# Patient Record
Sex: Female | Born: 1960 | Race: White | Hispanic: No | Marital: Married
Health system: Midwestern US, Community
[De-identification: ages and names within clinical notes are randomized; demographics above are authoritative.]

## PROBLEM LIST (undated history)

## (undated) DIAGNOSIS — IMO0002 Reserved for concepts with insufficient information to code with codable children: Secondary | ICD-10-CM

## (undated) DIAGNOSIS — M79641 Pain in right hand: Secondary | ICD-10-CM

## (undated) DIAGNOSIS — R059 Cough, unspecified: Secondary | ICD-10-CM

## (undated) DIAGNOSIS — R920 Mammographic microcalcification found on diagnostic imaging of breast: Secondary | ICD-10-CM

## (undated) DIAGNOSIS — Z79899 Other long term (current) drug therapy: Secondary | ICD-10-CM

## (undated) DIAGNOSIS — J029 Acute pharyngitis, unspecified: Secondary | ICD-10-CM

## (undated) DIAGNOSIS — Z9889 Other specified postprocedural states: Secondary | ICD-10-CM

## (undated) DIAGNOSIS — R109 Unspecified abdominal pain: Secondary | ICD-10-CM

## (undated) DIAGNOSIS — M25551 Pain in right hip: Secondary | ICD-10-CM

## (undated) DIAGNOSIS — R079 Chest pain, unspecified: Secondary | ICD-10-CM

## (undated) DIAGNOSIS — R002 Palpitations: Secondary | ICD-10-CM

## (undated) DIAGNOSIS — M25819 Other specified joint disorders, unspecified shoulder: Secondary | ICD-10-CM

## (undated) DIAGNOSIS — D329 Benign neoplasm of meninges, unspecified: Secondary | ICD-10-CM

## (undated) DIAGNOSIS — M25552 Pain in left hip: Secondary | ICD-10-CM

## (undated) DIAGNOSIS — K909 Intestinal malabsorption, unspecified: Secondary | ICD-10-CM

## (undated) DIAGNOSIS — R922 Inconclusive mammogram: Secondary | ICD-10-CM

## (undated) DIAGNOSIS — N63 Unspecified lump in unspecified breast: Secondary | ICD-10-CM

## (undated) DIAGNOSIS — M1811 Unilateral primary osteoarthritis of first carpometacarpal joint, right hand: Secondary | ICD-10-CM

## (undated) DIAGNOSIS — M25559 Pain in unspecified hip: Secondary | ICD-10-CM

## (undated) DIAGNOSIS — N816 Rectocele: Secondary | ICD-10-CM

## (undated) DIAGNOSIS — M25561 Pain in right knee: Secondary | ICD-10-CM

## (undated) DIAGNOSIS — G4700 Insomnia, unspecified: Secondary | ICD-10-CM

## (undated) DIAGNOSIS — K859 Acute pancreatitis without necrosis or infection, unspecified: Secondary | ICD-10-CM

## (undated) DIAGNOSIS — R0602 Shortness of breath: Secondary | ICD-10-CM

## (undated) DIAGNOSIS — E7220 Disorder of urea cycle metabolism, unspecified: Secondary | ICD-10-CM

## (undated) MED ORDER — ZOLPIDEM SR 12.5 MG MULTIPHASE TAB
12.5 mg | ORAL_TABLET | ORAL | Status: DC
Start: ? — End: 2012-05-14

## (undated) MED ORDER — BENZONATATE 100 MG CAP
100 mg | ORAL_CAPSULE | Freq: Three times a day (TID) | ORAL | Status: AC | PRN
Start: ? — End: 2012-05-06

## (undated) MED ORDER — ALBUTEROL SULFATE HFA 90 MCG/ACTUATION AEROSOL INHALER
90 mcg/actuation | Freq: Four times a day (QID) | RESPIRATORY_TRACT | Status: DC | PRN
Start: ? — End: 2013-11-03

## (undated) MED ORDER — HYDROCODONE 10 MG-CHLORPHENIRAMINE 8 MG/5 ML ORAL SUSP EXTEND.REL 12HR
10-8 mg/5 mL | Freq: Two times a day (BID) | ORAL | Status: DC | PRN
Start: ? — End: 2012-05-14

## (undated) MED ORDER — HYDROCODONE 10 MG-CHLORPHENIRAMINE 8 MG/5 ML ORAL SUSP EXTEND.REL 12HR
10-8 mg/5 mL | Freq: Two times a day (BID) | ORAL | Status: DC | PRN
Start: ? — End: 2012-05-07

## (undated) MED ORDER — LEVOFLOXACIN 500 MG TAB
500 mg | ORAL_TABLET | Freq: Every day | ORAL | Status: DC
Start: ? — End: 2012-05-14

---

## 2008-08-16 LAB — CBC WITH AUTOMATED DIFF
ABS. EOSINOPHILS: 0 10*3/uL (ref 0.0–0.4)
ABS. LYMPHOCYTES: 1.1 10*3/uL (ref 0.8–3.5)
ABS. MONOCYTES: 0.4 10*3/uL (ref 0–1.0)
ABS. NEUTROPHILS: 4.3 10*3/uL (ref 1.8–8.0)
BASOPHILS: 0 % (ref 0–3)
EOSINOPHILS: 1 % (ref 0–5)
HCT: 41.9 % (ref 36.0–46.0)
HGB: 14.6 g/dL (ref 12.0–16.0)
LYMPHOCYTES: 19 % — ABNORMAL LOW (ref 20–51)
MCH: 31.5 PG (ref 25.0–35.0)
MCHC: 34.7 g/dL (ref 31.0–37.0)
MCV: 90.9 FL (ref 78.0–102.0)
MONOCYTES: 6 % (ref 2–9)
MPV: 9 FL (ref 7.4–10.4)
NEUTROPHILS: 74 % (ref 42–75)
PLATELET: 211 10*3/uL (ref 130–400)
RBC: 4.61 M/uL (ref 4.10–5.10)
RDW: 14.7 % — ABNORMAL HIGH (ref 11.5–14.5)
WBC: 5.8 10*3/uL (ref 4.5–13.0)

## 2008-08-16 LAB — TSH 3RD GENERATION: TSH: 0.79 u[IU]/mL (ref 0.51–6.27)

## 2008-08-16 LAB — LIPID PANEL
CHOL/HDL Ratio: 2.4 (ref 0–5.0)
Cholesterol, total: 201 MG/DL — ABNORMAL HIGH (ref 0–200)
HDL Cholesterol: 84 MG/DL — ABNORMAL HIGH (ref 40–60)
LDL, calculated: 94.4 MG/DL (ref 0–100)
Triglyceride: 113 MG/DL (ref 0–150)
VLDL, calculated: 22.6 MG/DL

## 2008-08-16 LAB — SED RATE (ESR): Sed rate (ESR): 2 MM/HR (ref 0–20)

## 2008-08-16 LAB — VITAMIN B12: Vitamin B12: 741 pg/mL (ref 211–911)

## 2008-08-16 LAB — MONONUCLEOSIS SCREEN: Mononucleosis screen: NEGATIVE

## 2008-08-16 LAB — AMYLASE: Amylase: 43 U/L (ref 25–115)

## 2008-08-16 LAB — T4, FREE: T4, Free: 1 NG/DL (ref 0.89–1.76)

## 2008-08-16 LAB — LIPASE: Lipase: 211 U/L (ref 114–286)

## 2008-08-16 LAB — T3, FREE: Triiodothyronine (T3), free: 3.1 PG/ML (ref 2.3–4.2)

## 2008-08-17 LAB — METABOLIC PANEL, BASIC
Anion gap: 8 mmol/L (ref 5–15)
BUN/Creatinine ratio: 16 (ref 12–20)
BUN: 13 MG/DL (ref 7–18)
CO2: 29 MMOL/L (ref 21–32)
Calcium: 9.2 MG/DL (ref 8.4–10.4)
Chloride: 100 MMOL/L (ref 100–108)
Creatinine: 0.8 MG/DL (ref 0.6–1.3)
GFR est AA: >60 mL/min/{1.73_m2} (ref >60)
GFR est non-AA: >60 mL/min/{1.73_m2} (ref >60)
Glucose: 77 MG/DL (ref 74–99)
Potassium: 4.8 MMOL/L (ref 3.5–5.5)
Sodium: 137 MMOL/L (ref 136–145)

## 2008-08-18 LAB — EPSTEIN BARR VIRUS AB PANEL
EBV Ab Early Ag,IgG: 0.13 IV (ref 0.00–0.99)
EBV Ab NucAg,IgG: 7.66 IV
EBV Ab VCA,IgG: 8.76 IV
EBV Ab VCA,IgM: 0.2 IV (ref 0.00–0.99)

## 2008-08-18 LAB — VITAMIN D, 25 HYDROXY: Vitamin D 25-Hydroxy: 36 ng/mL (ref 30–80)

## 2008-08-18 LAB — IMMUNOGLOBULIN G, QT: IMMUNOGLOBULIN G: 884 mg/dL (ref 768–1632)

## 2008-08-19 LAB — VITAMIN B6: VITAMIN B6: 9 ng/mL (ref 5.0–30.0)

## 2008-08-20 LAB — VITAMIN B1, WHOLE BLOOD: VITAMIN B1: 127 nmol/L (ref 70–180)

## 2008-12-06 LAB — METABOLIC PANEL, BASIC
Anion gap: 4 mmol/L — ABNORMAL LOW (ref 5–15)
BUN/Creatinine ratio: 16 (ref 12–20)
BUN: 14 MG/DL (ref 7–18)
CO2: 31 MMOL/L (ref 21–32)
Calcium: 8.9 MG/DL (ref 8.4–10.4)
Chloride: 106 MMOL/L (ref 100–108)
Creatinine: 0.9 MG/DL (ref 0.6–1.3)
GFR est AA: >60 mL/min/{1.73_m2} (ref >60)
GFR est non-AA: >60 mL/min/{1.73_m2} (ref >60)
Glucose: 83 MG/DL (ref 74–99)
Potassium: 5.3 MMOL/L (ref 3.5–5.5)
Sodium: 141 MMOL/L (ref 136–145)

## 2008-12-06 LAB — HEPATIC FUNCTION PANEL
A-G Ratio: 1.5 (ref 0.8–1.7)
ALT (SGPT): 43 U/L (ref 30–65)
AST (SGOT): 22 U/L (ref 15–37)
Albumin: 3.9 g/dL (ref 3.4–5.0)
Alk. phosphatase: 104 U/L (ref 50–136)
Bilirubin, direct: 0.1 MG/DL (ref 0.0–0.3)
Bilirubin, total: 0.4 MG/DL (ref 0.1–0.9)
Globulin: 2.6 g/dL (ref 2.0–4.0)
Protein, total: 6.5 g/dL (ref 6.4–8.2)

## 2008-12-06 LAB — LIPASE: Lipase: 102 U/L (ref 73–393)

## 2008-12-06 LAB — CBC WITH AUTOMATED DIFF
ABS. EOSINOPHILS: 0.1 10*3/uL (ref 0.0–0.4)
ABS. LYMPHOCYTES: 1.3 10*3/uL (ref 0.8–3.5)
ABS. MONOCYTES: 0.4 10*3/uL (ref 0–1.0)
ABS. NEUTROPHILS: 2.3 10*3/uL (ref 1.8–8.0)
BASOPHILS: 1 % (ref 0–3)
EOSINOPHILS: 2 % (ref 0–5)
HCT: 42.5 % (ref 36.0–46.0)
HGB: 14.5 g/dL (ref 12.0–16.0)
LYMPHOCYTES: 31 % (ref 20–51)
MCH: 30.7 PG (ref 25.0–35.0)
MCHC: 34 g/dL (ref 31.0–37.0)
MCV: 90.2 FL (ref 78.0–102.0)
MONOCYTES: 9 % (ref 2–9)
MPV: 9 FL (ref 7.4–10.4)
NEUTROPHILS: 57 % (ref 42–75)
PLATELET: 186 10*3/uL (ref 130–400)
RBC: 4.71 M/uL (ref 4.10–5.10)
RDW: 13.9 % (ref 11.5–14.5)
WBC: 4.1 10*3/uL — ABNORMAL LOW (ref 4.5–13.0)

## 2008-12-06 LAB — LIPID PANEL
CHOL/HDL Ratio: 2.7 (ref 0–5.0)
Cholesterol, total: 194 MG/DL (ref 0–200)
HDL Cholesterol: 73 MG/DL — ABNORMAL HIGH (ref 40–60)
LDL, calculated: 99.8 MG/DL (ref 0–100)
Triglyceride: 106 MG/DL (ref 0–150)
VLDL, calculated: 21.2 MG/DL

## 2008-12-06 LAB — SED RATE (ESR): Sed rate (ESR): 2 MM/HR (ref 0–20)

## 2008-12-06 LAB — T4, FREE: T4, Free: 0.9 NG/DL (ref 0.89–1.76)

## 2008-12-06 LAB — AMYLASE: Amylase: 39 U/L (ref 25–115)

## 2008-12-06 LAB — TSH 3RD GENERATION: TSH: 1.17 u[IU]/mL (ref 0.51–6.27)

## 2008-12-06 LAB — VITAMIN B12: Vitamin B12: 529 pg/mL (ref 211–911)

## 2008-12-07 LAB — HEMOGLOBIN A1C WITH EAG: Hemoglobin A1c: 5.1 % (ref 4.8–6.0)

## 2008-12-08 LAB — ZINC: Zinc,serum: 70 ug/dL (ref 60–120)

## 2008-12-08 LAB — VITAMIN D, 25 HYDROXY: Vitamin D 25-Hydroxy: 36 ng/mL (ref 30–80)

## 2008-12-09 LAB — VITAMIN B6: VITAMIN B6: 12.5 ng/mL (ref 5.0–30.0)

## 2008-12-10 LAB — VITAMIN B1, WHOLE BLOOD: VITAMIN B1: 112 nmol/L (ref 70–180)

## 2008-12-14 NOTE — Telephone Encounter (Signed)
Message copied by Antonieta Pert on Thu Dec 14, 2008  2:48 PM  ------       Message from: Bonney Leitz ANN       Created: Wed Dec 13, 2008  3:24 PM       Regarding: Cornell Barman: [phone]         Patient requests lab results done at [facility] last week.              mh

## 2008-12-18 NOTE — Telephone Encounter (Signed)
Dr Betha Loa talked with pt

## 2008-12-19 LAB — AMB POC RAPID STREP A: Group A Strep Ag: NEGATIVE

## 2008-12-19 MED ORDER — LEVOFLOXACIN 500 MG TAB
500 mg | ORAL_TABLET | Freq: Every day | ORAL | Status: AC
Start: 2008-12-19 — End: 2008-12-24

## 2008-12-19 MED ORDER — BENZOCAINE 10 MG LOZENGES
10 mg | ORAL_TABLET | Freq: Four times a day (QID) | Status: DC
Start: 2008-12-19 — End: 2009-06-06

## 2008-12-19 NOTE — Progress Notes (Signed)
CHIEF COMPLAINT:  Chief Complaint   Patient presents with   ??? Sore Throat     uncontrolled     UTI   HISTORY OF THE PRESENT ILLNESS:  Rosette Bellavance is a 48 y.o. female    Going on sore throat since yesterday face hurts on left side ? toooth causing pain even ear was hurting . No numbness or tingling     Burning sensation after cystoscope called dr Jacquenette Shone and he started ciproflo\\xacin   Helping a little but it has been 5 days so getting better     PAST MEDICAL HISTORY:    FAMILY HISTORY:  Family History   Problem Relation   ??? Asthma Mother   ??? Breast Cancer Mother   ??? Other Father     cabg       MEDICATIONS:  Current outpatient prescriptions:venlafaxine-SR (EFFEXOR XR) 75 mg capsule, Take 75 mg by mouth daily., Disp: , Rfl: ;  ERGOCALCIFEROL (VITAMIN D PO), Take 1,000 Units by mouth two (2) times a day., Disp: , Rfl: ;  hyoscyamine (LEVSIN) 0.125 mg tablet, Take 125 mcg by mouth every four (4) hours as needed., Disp: , Rfl: ;  primidone (MYSOLINE) 50 mg tablet, Take 50 mg by mouth three (3) times daily. One in pm and 1/2 in am, Disp: , Rfl:   TRAMADOL HCL (TRAMADOL PO), Take 50 mg by mouth every evening. One to two tabs prn, Disp: , Rfl: ;  amitriptyline (ELAVIL) 75 mg tablet, Take 25 mg by mouth nightly., Disp: , Rfl: ;  SOLIFENACIN SUCCINATE (VESICARE PO), Take  by mouth., Disp: , Rfl: ;  ciprofloxacin 100 mg tablet, Take 100 mg by mouth two (2) times a day., Disp: , Rfl:   There are no discontinued medications.  ALLERGY:  Allergies   Allergen Reactions   ??? Tetracycline Unknown (comments)       REVIEW OF SYSTEMS:  CVS:    No Chest pain or Palpitations  Respiratory:  No Shortness of breath or cough  PHYSICAL EXAMINATION:  GEN: BP 119/83   Pulse 81   Temp(Src) 98 ??F (36.7 ??C) (Oral)   Ht 5' 2.5" (1.588 m)   Wt 161 lb 4 oz (73.143 kg)   Appearance       Normal   EYE: Conjunctiva/Lids(congestion or icterus)   Normal    Pupils(ERRLA EOM Nystagmus)    Normal   ENT: Mouth Inspection(Lesions Dentician)   Normal     Oropharynx (Tonsil Eryth Exud)   Normal   Neck:  Exam (supple ROM LAN Bruit)   Normal    Thyroid(Goiter Nodularity)    Normal   Lung: Effort(flaring retractions abd breathing)  Normal    Auscultation(coarse wheezing or crackles)  Normal   CVS: Auscultation(murmur click rub gallop)  Normal   Extremities(cyanosis clubbing edema)  Normal  GI: Mass Tenderness(distended)    Normal   Liver Spleen(hepatosplenomegaly ascites other) Normal  LABORATORY AND IMAGING DATA:  Strept throat -   ASSESSMENT:  Chief Complaint   Patient presents with   ??? Sore Throat     uncontrolled     UTI  Plan:  Stop Cipro  Start Levaquin to rx both sinuses and uti  Benzocaine to numb pain   ER if any changes or increasing pain or numbness

## 2008-12-19 NOTE — Progress Notes (Signed)
Sore throat since yesterday

## 2008-12-19 NOTE — Patient Instructions (Signed)
Plan:  Stop Cipro  Start Levaquin to rx both sinuses and uti  Benzocaine to numb pain   ER if any changes or increasing pain or numbness

## 2009-01-03 NOTE — Telephone Encounter (Signed)
Offered ENT pt not interested  No colored mucous hold off on abx  Immediate appt if needed will try zyrtec and call if still having problems

## 2009-03-09 LAB — VITAMIN B12: Vitamin B12: 626 pg/mL (ref 211–911)

## 2009-03-09 LAB — FOLLICLE STIMULATING HORMONE: FSH: 7.6 m[IU]/mL

## 2009-03-10 LAB — VITAMIN D, 25 HYDROXY: Vitamin D 25-Hydroxy: 38 ng/mL (ref 30–80)

## 2009-06-06 LAB — AMB POC RAPID STREP A: Group A Strep Ag: NEGATIVE

## 2009-06-06 MED ORDER — CEPHALEXIN 500 MG CAP
500 mg | ORAL_CAPSULE | Freq: Three times a day (TID) | ORAL | Status: AC
Start: 2009-06-06 — End: 2009-06-20

## 2009-06-06 MED ORDER — HYDROCODONE 10 MG-CHLORPHENIRAMINE 8 MG/5 ML ORAL SUSP EXTEND.REL 12HR
10-8 mg/5 mL | Freq: Two times a day (BID) | ORAL | Status: DC | PRN
Start: 2009-06-06 — End: 2009-07-17

## 2009-06-06 NOTE — Progress Notes (Signed)
Chief Complaint   Patient presents with   ??? Sore Throat   ??? Fever   ??? Diarrhea   ??? Cough

## 2009-06-06 NOTE — Progress Notes (Signed)
Chief Complaint   Patient presents with   ??? Sore Throat   ??? Fever   ??? Diarrhea   ??? Cough       HISTORY OF THE PRESENT ILLNESS:  Cassandra Daniels is a 48 y.o. female    Sore throat  Fever  Cough last night   I am trying to wheeza a little  A little of diarrhea   Fatigue     Past Medical History   Diagnosis Date   ??? Arthritis    ??? Fibrocystic breast    ??? Mitral (valve) prolapse    ??? Chronic fatigue    ??? Sleep apnea    ??? Vitamin B 12 deficiency    ??? Paroxysmal atrial tachycardia    ??? HTN        Family History   Problem Relation Age of Onset   ??? Asthma Mother    ??? Breast Cancer Mother    ??? Other Father      cabg       History   Social History   ??? Marital Status: Married     Spouse Name: N/A     Number of Children: N/A   ??? Years of Education: N/A   Occupational History   ??? Not on file.   Social History Main Topics   ??? Tobacco Use: Never   ??? Alcohol Use: Yes      rare   ??? Drug Use: No   ??? Sexually Active:    Other Topics Concern   ??? Not on file   Social History Narrative   ??? No narrative on file       MEDICATIONS:  Current outpatient prescriptions   Medication Sig   ??? amitriptyline (ELAVIL) 25 mg tablet Take  by mouth nightly.   ??? venlafaxine-SR (EFFEXOR XR) 150 mg capsule Take  by mouth daily.   ??? ERGOCALCIFEROL (VITAMIN D PO) Take 1,000 Units by mouth two (2) times a day.   ??? hyoscyamine (LEVSIN) 0.125 mg tablet Take 125 mcg by mouth every four (4) hours as needed.   ??? primidone (MYSOLINE) 50 mg tablet Take 50 mg by mouth three (3) times daily. One in pm and 1/2 in am   ??? TRAMADOL HCL (TRAMADOL PO) Take 50 mg by mouth. Take 150mg  BID prn   ??? SOLIFENACIN SUCCINATE (VESICARE PO) Take  by mouth.   ??? benzocaine 10 mg Lozg 10 mg by Mucous Membrane route four (4) times daily.       Medications Discontinued During This Encounter   Medication Reason   ??? amitriptyline (ELAVIL) 75 mg tablet    ??? venlafaxine-SR (EFFEXOR XR) 75 mg capsule        Allergies   Allergen Reactions   ??? Tetracycline Unknown (comments)        REVIEW OF SYSTEMS:  CVS:    No Chest pain or Palpitations  Respiratory:  No Shortness of breath or cough  PHYSICAL EXAMINATION:  Constitutional: She is oriented. She appears well-developed and well-nourished.   BP 130/86   Pulse 81   Temp(Src) 98.8 ??F (37.1 ??C) (Oral)   Wt 162 lb (73.483 kg)  HENT: ??  Head: Normocephalic and atraumatic.   Nose: Nose normal. ??  Mouth/Throat: Oropharynx is clear and moist. No oropharyngeal exudate.   Eyes: Conjunctivae and extraocular motions are normal. Pupils are equal, round, and reactive to light. Right eye exhibits no discharge. Left eye exhibits no discharge. No scleral icterus.   Neck: Neck supple.  No thyromegaly present. She has no cervical adenopathy.   Cardiovascular: Normal rate, regular rhythm and normal heart sounds.?? Exam reveals no gallop and no friction rub.?? ??  No murmur heard.  Extremities no edema.  Pulmonary/Chest: Effort normal and breath sounds normal. No respiratory distress. She has no wheezes. She has no rales.   Abdominal: Soft. Bowel sounds are normal. She exhibits no distension and no mass. No tenderness. She has no rebound and no guarding.     LABORATORY AND IMAGING DATA:  Reviewed for details refer lab and imaging section  ASSESSMENT AND PLAN:    1. Sore throat (462AA)        Orders Placed This Encounter   ??? Amb poc rapid strep a   ??? Amitriptyline (elavil) 25 mg tablet   ??? Venlafaxine-sr (effexor xr) 150 mg capsule   ??? Chlorpheniramine-hydrocodone (tussionex) 8-10 mg/5 ml suspension   ??? Cephalexin (keflex) 500 mg capsule             Patient Instruction.   Never assume a test is normal if not contacted. Any orders normal or abnormal will always be communicated. You are always welcome to call for results. Regular follow up is always necessary as directed.

## 2009-06-13 NOTE — Telephone Encounter (Signed)
Phoned in script to [facility] [phone]) for Ambien Cr 12.5mg , #30, 1 QHS prn with 1 refill

## 2009-07-11 MED ORDER — CEPHALEXIN 500 MG CAP
500 mg | ORAL_CAPSULE | Freq: Three times a day (TID) | ORAL | Status: AC
Start: 2009-07-11 — End: 2009-07-18

## 2009-07-16 NOTE — Telephone Encounter (Signed)
Status Change Notification Status From Status To Changed By    Read Cassandra Frame, MD on Mon Jul 16, 2009 9:42 PM        Cassandra Daniels ','<<< Less Detail   From Cassandra Daniels   Sent Monday July 16, 2009 9:52 AM   To Cassandra Daniels   Cc Cassandra Rio, MD   Phone [phone]   Subject Cassandra Daniels   Patient Cassandra Daniels [[id number]] (DOB: Jun 06, 1961)   Phone Entered Pt Work Pt Home     [phone]           Message Cassandra Daniels is not feeling any better today. She called in about a week ago and you called in something for her. She is still feeling very week and tired over the weekend and heavy breathing, she is starting to cough more now. She wants to know if you want to send her out for a chest x-ray. Could you please call her cell phone during the day. after 4:30 call her home at [phone]     Pt aware to come in tomorrow at 3 pm

## 2009-07-17 MED ORDER — HYDROCODONE 10 MG-CHLORPHENIRAMINE 8 MG/5 ML ORAL SUSP EXTEND.REL 12HR
10-8 mg/5 mL | Freq: Two times a day (BID) | ORAL | Status: DC | PRN
Start: 2009-07-17 — End: 2009-11-06

## 2009-07-17 MED ORDER — FEXOFENADINE-PSEUDOEPHEDRINE SR 60 MG-120 MG 12 HR TAB
60-120 mg | ORAL_TABLET | Freq: Two times a day (BID) | ORAL | Status: DC
Start: 2009-07-17 — End: 2009-07-20

## 2009-07-17 NOTE — Progress Notes (Signed)
Chief Complaint   Patient presents with   ??? Sore Throat   ??? Other     chest 'felt heavy'   ??? Fatigue

## 2009-07-19 NOTE — Progress Notes (Signed)
Chief Complaint   Patient presents with   ??? Sore Throat   ??? Other     chest 'felt heavy'   ??? Fatigue       HISTORY OF THE PRESENT ILLNESS:  Solstice Lastinger is a 48 y.o. female      Past Medical History   Diagnosis Date   ??? Arthritis    ??? Fibrocystic breast    ??? Mitral (valve) prolapse    ??? Chronic fatigue    ??? Sleep apnea    ??? Vitamin B 12 deficiency    ??? Paroxysmal atrial tachycardia    ??? HTN        Family History   Problem Relation Age of Onset   ??? Asthma Mother    ??? Breast Cancer Mother    ??? Other Father      cabg       History   Social History   ??? Marital Status: Married     Spouse Name: N/A     Number of Children: N/A   ??? Years of Education: N/A   Occupational History   ??? Not on file.   Social History Main Topics   ??? Tobacco Use: Never   ??? Alcohol Use: Yes      rare   ??? Drug Use: No   ??? Sexually Active:    Other Topics Concern   ??? Not on file   Social History Narrative   ??? No narrative on file       MEDICATIONS:  Current outpatient prescriptions   Medication Sig   ??? fexofenadine-pseudoephedrine (ALLEGRA-D 12 HOUR) 60-120 mg per tablet Take 1 Tab by mouth two (2) times a day for 10 days.   ??? chlorpheniramine-hydrocodone (TUSSIONEX) 8-10 mg/5 mL suspension Take 5 mL by mouth every twelve (12) hours as needed for Cough.   ??? cephALEXin (KEFLEX) 500 mg capsule Take 1 Cap by mouth three (3) times daily for 7 days.   ??? amitriptyline (ELAVIL) 25 mg tablet Take  by mouth nightly.   ??? venlafaxine-SR (EFFEXOR XR) 150 mg capsule Take  by mouth daily.   ??? ERGOCALCIFEROL (VITAMIN D PO) Take 1,000 Units by mouth two (2) times a day.   ??? hyoscyamine (LEVSIN) 0.125 mg tablet Take 125 mcg by mouth every four (4) hours as needed.   ??? primidone (MYSOLINE) 50 mg tablet Take 50 mg by mouth three (3) times daily. One in pm and 1/2 in am   ??? TRAMADOL HCL (TRAMADOL PO) Take 50 mg by mouth. Take 150mg  BID prn       Medications Discontinued During This Encounter   Medication Reason    ??? chlorpheniramine-hydrocodone (TUSSIONEX) 8-10 mg/5 mL suspension Reorder       Allergies   Allergen Reactions   ??? Tetracycline Unknown (comments)       REVIEW OF SYSTEMS:  CVS:    No Chest pain or Palpitations  Respiratory:  No Shortness of breath +cough  PHYSICAL EXAMINATION:  Constitutional: She is oriented. She appears well-developed and well-nourished.   BP 128/84   Pulse 111   Temp(Src) 98.9 ??F (37.2 ??C) (Oral)   Wt 166 lb 12 oz (75.637 kg)  HENT: ??  Head: Normocephalic and atraumatic.   Nose: Nose normal. ??  Mouth/Throat: Oropharynx is clear and moist. No oropharyngeal exudate.   Eyes: Conjunctivae and extraocular motions are normal. Pupils are equal, round, and reactive to light. Right eye exhibits no discharge. Left eye exhibits no discharge. No scleral  icterus.   Neck: Neck supple. No thyromegaly present. She has no cervical adenopathy.   Cardiovascular: Normal rate, regular rhythm and normal heart sounds.?? Exam reveals no gallop and no friction rub.?? ??  No murmur heard.  Extremities no edema.  Pulmonary/Chest: Effort normal and breath sounds normal. No respiratory distress. She has no wheezes. She has no rales.   Abdominal: Soft. Bowel sounds are normal. She exhibits no distension and no mass. No tenderness. She has no rebound and no guarding.     LABORATORY AND IMAGING DATA:  Reviewed for details refer lab and imaging section  ASSESSMENT AND PLAN:    1. Cough (786.2)          Orders Placed This Encounter   ??? Xr chest pa and lateral   ??? Metabolic panel, basic   ??? Cbc with automated diff   ??? Sed rate (esr)   ??? Fexofenadine-pseudoephedrine (allegra-d 12 hour) 60-120 mg per tablet   ??? Chlorpheniramine-hydrocodone (tussionex) 8-10 mg/5 ml suspension             Patient Instruction.   Never assume a test is normal if not contacted. Any orders normal or abnormal will always be communicated. You are always welcome to call for results. Regular follow up is always necessary as directed.

## 2009-07-21 LAB — SED RATE (ESR): Sed rate (ESR): 3 MM/HR (ref 0–20)

## 2009-07-21 MED ORDER — LEVOCETIRIZINE 5 MG TAB
5 mg | ORAL_TABLET | Freq: Every day | ORAL | Status: DC
Start: 2009-07-21 — End: 2009-11-06

## 2009-07-21 MED ORDER — AZITHROMYCIN 250 MG TAB
250 mg | ORAL_TABLET | ORAL | Status: AC
Start: 2009-07-21 — End: 2009-07-25

## 2009-07-23 LAB — LIPID PANEL
CHOL/HDL Ratio: 2.5 (ref 0–5.0)
Cholesterol, total: 195 MG/DL (ref 0–200)
HDL Cholesterol: 79 MG/DL — ABNORMAL HIGH (ref 40–60)
LDL, calculated: 90.2 MG/DL (ref 0–100)
Triglyceride: 129 MG/DL (ref 0–150)
VLDL, calculated: 25.8 MG/DL

## 2009-07-23 LAB — CBC WITH AUTOMATED DIFF
ABS. BASOPHILS: 0 10*3/uL (ref 0.0–0.1)
ABS. EOSINOPHILS: 0.1 10*3/uL (ref 0.0–0.4)
ABS. LYMPHOCYTES: 1.8 10*3/uL (ref 0.8–3.5)
ABS. MONOCYTES: 0.6 10*3/uL (ref 0–1.0)
ABS. NEUTROPHILS: 5.8 10*3/uL (ref 1.8–8.0)
BASOPHILS: 0 % (ref 0–3)
EOSINOPHILS: 1 % (ref 0–5)
HCT: 44.7 % (ref 36.0–46.0)
HGB: 14.2 g/dL (ref 12.0–16.0)
LYMPHOCYTES: 22 % (ref 20–51)
MCH: 29.9 PG (ref 25.0–35.0)
MCHC: 31.7 g/dL (ref 31.0–37.0)
MCV: 94.3 FL (ref 78.0–102.0)
MONOCYTES: 7 % (ref 2–9)
MPV: 9.1 FL (ref 7.4–10.4)
NEUTROPHILS: 70 % (ref 42–75)
PLATELET: 251 10*3/uL (ref 130–400)
RBC: 4.75 M/uL (ref 4.10–5.10)
RDW: 13.9 % (ref 11.5–14.5)
WBC: 8.3 10*3/uL (ref 4.5–13.0)

## 2009-07-23 LAB — METABOLIC PANEL, COMPREHENSIVE
A-G Ratio: 1.4 (ref 0.8–1.7)
ALT (SGPT): 53 U/L (ref 30–65)
AST (SGOT): 28 U/L (ref 15–37)
Albumin: 3.9 g/dL (ref 3.4–5.0)
Alk. phosphatase: 117 U/L (ref 50–136)
Anion gap: 3 mmol/L — ABNORMAL LOW (ref 5–15)
BUN/Creatinine ratio: 14 (ref 12–20)
BUN: 10 MG/DL (ref 7–18)
Bilirubin, total: 0.3 MG/DL (ref 0.1–0.9)
CO2: 33 MMOL/L — ABNORMAL HIGH (ref 21–32)
Calcium: 9.3 MG/DL (ref 8.4–10.4)
Chloride: 102 MMOL/L (ref 100–108)
Creatinine: 0.7 MG/DL (ref 0.6–1.3)
GFR est AA: >60 mL/min/{1.73_m2} (ref >60)
GFR est non-AA: >60 mL/min/{1.73_m2} (ref >60)
Globulin: 2.8 g/dL (ref 2.0–4.0)
Glucose: 62 MG/DL — ABNORMAL LOW (ref 74–99)
Potassium: 4.2 MMOL/L (ref 3.5–5.5)
Protein, total: 6.7 g/dL (ref 6.4–8.2)
Sodium: 138 MMOL/L (ref 136–145)

## 2009-07-23 LAB — VITAMIN B12 & FOLATE
Folate: 22.6 ng/mL (ref 5.38–24.0)
Vitamin B12: 780 pg/mL (ref 211–911)

## 2009-07-23 LAB — IRON: Iron: 122 ug/dL (ref 35–150)

## 2009-07-23 LAB — VITAMIN B1, WHOLE BLOOD: VITAMIN B1: 118 nmol/L (ref 70–180)

## 2009-09-19 MED ORDER — HYOSCYAMINE 0.125 MG SUBLINGUAL TAB
0.125 mg | ORAL_TABLET | SUBLINGUAL | Status: DC
Start: 2009-09-19 — End: 2011-04-07

## 2009-09-19 NOTE — Progress Notes (Signed)
Chief Complaint   Patient presents with   ??? Epigastric Pain     upper       HISTORY OF THE PRESENT ILLNESS:  Cassandra Daniels is a 49 y.o. female    2 days ago   Had constipation and strained  And today 2 hours ago developed some severe periumbilical pain and  Lasted half hour   Got better after I took Vicodin  Called Dr. Christell Constant told her see PCP  No nausea   I have a little heart burn  No fever or chills  No change in the color of the urine or stool  Did not eat anything unusual       Past Medical History   Diagnosis Date   ??? Arthritis    ??? Fibrocystic breast    ??? Mitral (valve) prolapse    ??? Chronic fatigue    ??? Sleep apnea    ??? Vitamin B 12 deficiency    ??? Paroxysmal atrial tachycardia    ??? HTN        Family History   Problem Relation Age of Onset   ??? Asthma Mother    ??? Breast Cancer Mother    ??? Other Father      cabg       History   Social History   ??? Marital Status: Married     Spouse Name: N/A     Number of Children: N/A   ??? Years of Education: N/A   Occupational History   ??? Not on file.   Social History Main Topics   ??? Smoking status: Never Smoker    ??? Smokeless tobacco: Never Used   ??? Alcohol Use: Yes      rare   ??? Drug Use: No   ??? Sexually Active:    Other Topics Concern   ??? Not on file   Social History Narrative   ??? No narrative on file       MEDICATIONS:  Current outpatient prescriptions   Medication Sig   ??? hyoscyamine SL (LEVSIN/SL) 0.125 mg SL tablet PLACE 1 TABLET UNDER TONGUE EVERY 4 HOURS AS NEEDED   ??? amitriptyline (ELAVIL) 25 mg tablet Take  by mouth nightly.   ??? venlafaxine-SR (EFFEXOR XR) 150 mg capsule Take  by mouth daily.   ??? ERGOCALCIFEROL (VITAMIN D PO) Take 1,000 Units by mouth two (2) times a day.   ??? primidone (MYSOLINE) 50 mg tablet Take 50 mg by mouth three (3) times daily. One in pm and 1/2 in am   ??? TRAMADOL HCL (TRAMADOL PO) Take 50 mg by mouth. Take 150mg  BID prn   ??? Levocetirizine (XYZAL) 5 mg tablet Take 5 mg by mouth daily.    ??? chlorpheniramine-hydrocodone (TUSSIONEX) 8-10 mg/5 mL suspension Take 5 mL by mouth every twelve (12) hours as needed for Cough.       There are no discontinued medications.  Allergies   Allergen Reactions   ??? Tetracycline Unknown (comments)       REVIEW OF SYSTEMS:  CVS:    No Chest pain or Palpitations  Respiratory:  No Shortness of breath or cough  PHYSICAL EXAMINATION:  Constitutional: Cassandra Daniels is oriented. Cassandra Daniels appears well-developed and well-nourished.   BP 130/85   Pulse 95   Temp(Src) 98.6 ??F (37 ??C) (Oral)   Wt 164 lb 8 oz (74.617 kg)  HENT: ??  Head: Normocephalic and atraumatic.   Nose: Nose normal. ??  Mouth/Throat: Oropharynx is clear and moist. No oropharyngeal  exudate.   Eyes: Conjunctivae and extraocular motions are normal. Pupils are equal, round, and reactive to light. Right eye exhibits no discharge. Left eye exhibits no discharge. No scleral icterus.   Neck: Neck supple. No thyromegaly present. Cassandra Daniels has no cervical adenopathy.   Cardiovascular: Normal rate, regular rhythm and normal heart sounds.?? Exam reveals no gallop and no friction rub.?? ??  No murmur heard.  Extremities no edema.  Pulmonary/Chest: Effort normal and breath sounds normal. No respiratory distress. Cassandra Daniels has no wheezes. Cassandra Daniels has no rales.   Abdominal: Soft. Bowel sounds are normal. Cassandra Daniels exhibits no distension and no mass. No tenderness. Cassandra Daniels has ????rebound and no guarding.     LABORATORY AND IMAGING DATA:  Reviewed for details refer lab and imaging section  ASSESSMENT AND PLAN:    1. Pain in the abdomen (789.00BF)          Orders Placed This Encounter   ??? US gallbladder         Offered pt CT said had many wants to hold off feeling better    15 min spent with pt more than half of which was spent counseling pt re disease process, medications and alternatives to medications, need for close FU.  Counseled not to take vicodin and if pain reoccurs to go to ER   Possible diverticulitis  Appendicitis   Gastritis  Close FU

## 2009-09-19 NOTE — Progress Notes (Signed)
Chief Complaint   Patient presents with   ??? Epigastric Pain     upper

## 2009-09-21 NOTE — Progress Notes (Signed)
Chief Complaint   Patient presents with   ??? Follow-up     to previous visit for abdominal pain   ??? Diarrhea

## 2009-09-21 NOTE — Progress Notes (Signed)
Chief Complaint   Patient presents with   ??? Follow-up     to previous visit for abdominal pain   ??? Diarrhea       HISTORY OF THE PRESENT ILLNESS:  MKAYLA STEELE is a 49 y.o. female    Pain better  Did not reoccur  Diarrhea  6 times past 24 h  Green  Foul smelling    Past Medical History   Diagnosis Date   ??? Arthritis    ??? Fibrocystic breast    ??? Mitral (valve) prolapse    ??? Chronic fatigue    ??? Sleep apnea    ??? Vitamin B 12 deficiency    ??? Paroxysmal atrial tachycardia    ??? HTN        Family History   Problem Relation Age of Onset   ??? Asthma Mother    ??? Breast Cancer Mother    ??? Other Father      cabg       History   Social History   ??? Marital Status: Married     Spouse Name: N/A     Number of Children: N/A   ??? Years of Education: N/A   Occupational History   ??? Not on file.   Social History Main Topics   ??? Smoking status: Never Smoker    ??? Smokeless tobacco: Never Used   ??? Alcohol Use: Yes      rare   ??? Drug Use: No   ??? Sexually Active:    Other Topics Concern   ??? Not on file   Social History Narrative   ??? No narrative on file       MEDICATIONS:  Current outpatient prescriptions   Medication Sig   ??? hyoscyamine SL (LEVSIN/SL) 0.125 mg SL tablet PLACE 1 TABLET UNDER TONGUE EVERY 4 HOURS AS NEEDED   ??? Levocetirizine (XYZAL) 5 mg tablet Take 5 mg by mouth daily.   ??? chlorpheniramine-hydrocodone (TUSSIONEX) 8-10 mg/5 mL suspension Take 5 mL by mouth every twelve (12) hours as needed for Cough.   ??? amitriptyline (ELAVIL) 25 mg tablet Take  by mouth nightly.   ??? venlafaxine-SR (EFFEXOR XR) 150 mg capsule Take  by mouth daily.   ??? ERGOCALCIFEROL (VITAMIN D PO) Take 1,000 Units by mouth two (2) times a day.   ??? primidone (MYSOLINE) 50 mg tablet Take 50 mg by mouth three (3) times daily. One in pm and 1/2 in am   ??? TRAMADOL HCL (TRAMADOL PO) Take 50 mg by mouth. Take 150mg  BID prn       There are no discontinued medications.  Allergies   Allergen Reactions   ??? Tetracycline Unknown (comments)       REVIEW OF SYSTEMS:   CVS:    No Chest pain or Palpitations  Respiratory:  No Shortness of breath or cough  PHYSICAL EXAMINATION:  Constitutional: She is oriented. She appears well-developed and well-nourished.   BP 129/84   Pulse 108   Temp(Src) 97.4 ??F (36.3 ??C) (Oral)  HENT: ??  Head: Normocephalic and atraumatic.   Nose: Nose normal. ??  Mouth/Throat: Oropharynx is clear and moist. No oropharyngeal exudate.   Eyes: Conjunctivae and extraocular motions are normal. Pupils are equal, round, and reactive to light. Right eye exhibits no discharge. Left eye exhibits no discharge. No scleral icterus.   Neck: Neck supple. No thyromegaly present. She has no cervical adenopathy.   Cardiovascular: Normal rate, regular rhythm and normal heart sounds.?? Exam reveals no  gallop and no friction rub.?? ??  No murmur heard.  Extremities no edema.  Pulmonary/Chest: Effort normal and breath sounds normal. No respiratory distress. She has no wheezes. She has no rales.   Abdominal: Soft. Bowel sounds are normal. She exhibits no distension and no mass. No tenderness. She has no rebound and no guarding. ?? Mild rebound pt denies pain    LABORATORY AND IMAGING DATA:  Reviewed for details refer lab and imaging section  ASSESSMENT AND PLAN:    1. Diarrhea (787.91)          Orders Placed This Encounter   ??? Culture, urine   ??? Culture, stool   ??? Metabolic panel, basic   ??? Cbc with automated diff   ??? Hepatic function panel   ??? Urinalysis w/ rflx microscopic   ??? Amylase   ??? Lipase   ??? Sed rate (esr)   ??? Wbc, stool   ??? Occult blood, stool       Pt reluctant to have CT said feels a lot better agreed to go to ER if any change and to FU soon

## 2009-09-24 LAB — URINALYSIS W/ RFLX MICROSCOPIC
Bilirubin: NEGATIVE
Blood: NEGATIVE
Glucose: NEGATIVE
Ketone: NEGATIVE
Leukocyte Esterase: NEGATIVE
Nitrites: NEGATIVE
Protein: NEGATIVE
Specific Gravity: 1.02 (ref 1.005–1.030)
Urobilinogen: 0.2 mg/dL (ref 0.0–1.9)
pH (UA): 5.5 (ref 5.0–7.5)

## 2009-09-24 LAB — METABOLIC PANEL, BASIC
BUN/Creatinine ratio: 13 (ref 9–23)
BUN: 11 mg/dL (ref 6–24)
CO2: 24 mmol/L (ref 20–32)
Calcium: 9.4 mg/dL (ref 8.7–10.2)
Chloride: 105 mmol/L (ref 97–108)
Creatinine: 0.85 mg/dL (ref 0.57–1.00)
GFR est AA: >59 mL/min/{1.73_m2} (ref >59)
GFR est non-AA: >59 mL/min/{1.73_m2} (ref >59)
Glucose: 67 mg/dL (ref 65–99)
Potassium: 3.5 mmol/L (ref 3.5–5.2)
Sodium: 141 mmol/L (ref 135–145)

## 2009-09-24 LAB — CBC WITH AUTOMATED DIFF
ABS. BASOPHILS: 0 10*3/uL (ref 0.0–0.2)
ABS. EOSINOPHILS: 0 10*3/uL (ref 0.0–0.4)
ABS. IMM. GRANS.: 0 10*3/uL (ref 0.0–0.1)
ABS. LYMPHOCYTES: 1.3 10*3/uL (ref 0.7–4.5)
ABS. MONOCYTES: 0.4 10*3/uL (ref 0.1–1.0)
ABS. NEUTROPHILS: 3.3 10*3/uL (ref 1.8–7.8)
BASOPHILS: 0 % (ref 0–3)
EOSINOPHILS: 1 % (ref 0–7)
HCT: 44.8 % — ABNORMAL HIGH (ref 34.0–44.0)
HGB: 14.6 g/dL (ref 11.5–15.0)
IMMATURE GRANULOCYTES: 0 % (ref 0–1)
LYMPHOCYTES: 26 % (ref 14–46)
MCH: 30.6 pg (ref 27.0–34.0)
MCHC: 32.6 g/dL (ref 32.0–36.0)
MCV: 94 fL (ref 80–98)
MONOCYTES: 8 % (ref 4–13)
NEUTROPHILS: 65 % (ref 40–74)
PLATELET: 227 10*3/uL (ref 140–415)
RBC: 4.77 x10E6/uL (ref 3.80–5.10)
RDW: 13 % (ref 11.7–15.0)
WBC: 5.1 10*3/uL (ref 4.0–10.5)

## 2009-09-24 LAB — WBC, STOOL

## 2009-09-24 LAB — HEPATIC FUNCTION PANEL
ALT (SGPT): 19 IU/L (ref 0–40)
AST (SGOT): 17 IU/L (ref 0–40)
Albumin: 4 g/dL (ref 3.5–5.5)
Alk. phosphatase: 76 IU/L (ref 25–150)
Bilirubin, direct: 0.08 mg/dL (ref 0.00–0.40)
Bilirubin, total: 0.3 mg/dL (ref 0.0–1.2)
Protein, total: 6.2 g/dL (ref 6.0–8.5)

## 2009-09-24 LAB — CULTURE, URINE

## 2009-09-24 LAB — SED RATE (ESR): Sed rate (ESR): 1 mm/hr (ref 0–20)

## 2009-09-24 LAB — REQUEST PROBLEM

## 2009-09-24 LAB — OCCULT BLOOD, FECAL, IA

## 2009-09-24 LAB — LIPASE: Lipase: 26 U/L (ref 0–59)

## 2009-09-24 LAB — AMYLASE: Amylase: 67 U/L (ref 31–124)

## 2009-10-02 LAB — OCCULT BLOOD, FECAL, IA: Occult blood fecal, by IA: NEGATIVE

## 2009-10-02 LAB — REQUEST PROBLEM

## 2009-10-02 LAB — CULTURE, STOOL: E coli Shiga toxin: NEGATIVE

## 2009-10-02 LAB — TEST CODE CHANGE

## 2009-10-02 LAB — WBC, STOOL

## 2009-10-02 LAB — PLEASE NOTE

## 2009-10-17 NOTE — Telephone Encounter (Signed)
Meningioma fu

## 2009-10-29 MED ORDER — PRIMIDONE 50 MG TAB
50 mg | ORAL_TABLET | ORAL | Status: DC
Start: 2009-10-29 — End: 2009-10-31

## 2009-10-31 MED ORDER — PRIMIDONE 50 MG TAB
50 mg | ORAL_TABLET | ORAL | Status: DC
Start: 2009-10-31 — End: 2010-01-16

## 2009-11-06 LAB — AMB POC RAPID STREP A: Group A Strep Ag: NEGATIVE

## 2009-11-06 MED ORDER — CEPHALEXIN 500 MG CAP
500 mg | ORAL_CAPSULE | Freq: Three times a day (TID) | ORAL | Status: AC
Start: 2009-11-06 — End: 2009-11-13

## 2009-11-06 MED ORDER — FEXOFENADINE-PSEUDOEPHEDRINE SR 60 MG-120 MG 12 HR TAB
60-120 mg | ORAL_TABLET | Freq: Two times a day (BID) | ORAL | Status: AC
Start: 2009-11-06 — End: 2009-12-06

## 2009-11-06 MED ORDER — HYDROXYZINE PAMOATE 50 MG CAP
50 mg | ORAL_CAPSULE | Freq: Every evening | ORAL | Status: DC
Start: 2009-11-06 — End: 2009-11-09

## 2009-11-06 NOTE — Progress Notes (Signed)
Chief Complaint   Patient presents with   ??? Sore Throat       HISTORY OF THE PRESENT ILLNESS:  Cassandra Daniels is a 49 y.o. female  Yesterday   I have pain in the neck  No fever or chills   No sore throat  No mucous  No cough    Past Medical History   Diagnosis Date   ??? Arthritis    ??? Fibrocystic breast    ??? Mitral (valve) prolapse    ??? Chronic fatigue    ??? Sleep apnea    ??? Vitamin B 12 deficiency    ??? Paroxysmal atrial tachycardia    ??? HTN    ??? Meningioma        Family History   Problem Relation Age of Onset   ??? Asthma Mother    ??? Breast Cancer Mother    ??? Other Father      cabg       History   Social History   ??? Marital Status: Married     Spouse Name: N/A     Number of Children: N/A   ??? Years of Education: N/A   Occupational History   ??? Not on file.   Social History Main Topics   ??? Smoking status: Never Smoker    ??? Smokeless tobacco: Never Used   ??? Alcohol Use: Yes      rare   ??? Drug Use: No   ??? Sexually Active:    Other Topics Concern   ??? Not on file   Social History Narrative   ??? No narrative on file       MEDICATIONS:  Current outpatient prescriptions   Medication Sig   ??? CYANOCOBALAMIN, VITAMIN B-12, (VITAMIN B-12 PO) Take  by mouth daily.   ??? cephALEXin (KEFLEX) 500 mg capsule Take 1 Cap by mouth three (3) times daily for 7 days.   ??? fexofenadine-pseudoephedrine (ALLEGRA-D 12 HOUR) 60-120 mg per tablet Take 1 Tab by mouth two (2) times a day for 30 days.   ??? hydrOXYzine (VISTARIL) 50 mg capsule Take 1 Cap by mouth every evening for 30 days.   ??? primidone (MYSOLINE) 50 mg tablet Take 1 Tab by mouth as directed. One in pm and 1/2 in am   ??? hyoscyamine SL (LEVSIN/SL) 0.125 mg SL tablet PLACE 1 TABLET UNDER TONGUE EVERY 4 HOURS AS NEEDED   ??? amitriptyline (ELAVIL) 25 mg tablet Take  by mouth nightly.   ??? venlafaxine-SR (EFFEXOR XR) 150 mg capsule Take  by mouth daily.   ??? ERGOCALCIFEROL (VITAMIN D PO) Take 1,000 Units by mouth two (2) times a day.    ??? TRAMADOL HCL (TRAMADOL PO) Take 50 mg by mouth. Take 150mg  BID prn       Medications Discontinued During This Encounter   Medication Reason   ??? chlorpheniramine-hydrocodone (TUSSIONEX) 8-10 mg/5 mL suspension    ??? Levocetirizine (XYZAL) 5 mg tablet Other       Allergies   Allergen Reactions   ??? Tetracycline Unknown (comments)       REVIEW OF SYSTEMS:  CVS:    No Chest pain or Palpitations  Respiratory:  No Shortness of breath or cough  PHYSICAL EXAMINATION:  Constitutional: She is oriented. She appears well-developed and well-nourished.   BP 133/89   Pulse 102   Temp(Src) 98.8 ??F (37.1 ??C) (Oral)   Wt 170 lb (77.111 kg)  HENT: ??  Head: Normocephalic and atraumatic.   Nose: Nose normal. ??  Mouth/Throat: Oropharynx is clear and moist. No oropharyngeal exudate.   Eyes: Conjunctivae and extraocular motions are normal. Pupils are equal, round, and reactive to light. Right eye exhibits no discharge. Left eye exhibits no discharge. No scleral icterus.   Neck: Neck supple. No thyromegaly present. She has no cervical adenopathy.   Cardiovascular: Normal rate, regular rhythm and normal heart sounds.?? Exam reveals no gallop and no friction rub.?? ??  No murmur heard.  Extremities no edema.  Pulmonary/Chest: Effort normal and breath sounds normal. No respiratory distress. She has no wheezes. She has no rales.   Abdominal: Soft. Bowel sounds are normal. She exhibits no distension and no mass. No tenderness. She has no rebound and no guarding.     LABORATORY AND IMAGING DATA:  Results for orders placed in visit on 11/06/09   AMB POC RAPID STREP A   Component Value Range   ??? Group A Strep Ag negative           ASSESSMENT AND PLAN:    Cassandra Daniels was seen today for sore throat.    Diagnoses and associated orders for this visit:    Sore throat  - AMB POC RAPID STREP A    Other Orders  - CYANOCOBALAMIN, VITAMIN B-12, (VITAMIN B-12 PO); Take  by mouth daily.   - cephALEXin (KEFLEX) 500 mg capsule; Take 1 Cap by mouth three (3) times daily for 7 days.  - fexofenadine-pseudoephedrine (ALLEGRA-D 12 HOUR) 60-120 mg per tablet; Take 1 Tab by mouth two (2) times a day for 30 days.  - hydrOXYzine (VISTARIL) 50 mg capsule; Take 1 Cap by mouth every evening for 30 days.            Problem number 1 is uncontrolled,

## 2009-11-06 NOTE — Progress Notes (Signed)
Chief Complaint   Patient presents with   ??? Sore Throat

## 2009-11-09 MED ORDER — HYDROCODONE 10 MG-CHLORPHENIRAMINE 8 MG/5 ML ORAL SUSP EXTEND.REL 12HR
10-8 mg/5 mL | ORAL | Status: DC
Start: 2009-11-09 — End: 2010-07-11

## 2009-11-09 MED ORDER — LEVOCETIRIZINE 5 MG TAB
5 mg | ORAL_TABLET | Freq: Every day | ORAL | Status: DC
Start: 2009-11-09 — End: 2010-05-09

## 2009-11-15 MED ORDER — AZITHROMYCIN 250 MG TAB
250 mg | ORAL_TABLET | ORAL | Status: AC
Start: 2009-11-15 — End: 2009-11-20

## 2009-11-15 MED ORDER — HYDROCODONE 10 MG-CHLORPHENIRAMINE 8 MG/5 ML ORAL SUSP EXTEND.REL 12HR
10-8 mg/5 mL | Freq: Two times a day (BID) | ORAL | Status: DC | PRN
Start: 2009-11-15 — End: 2010-05-09

## 2009-11-15 NOTE — Progress Notes (Signed)
Chief Complaint   Patient presents with   ??? Other     F/U for Chest Congestion

## 2009-11-15 NOTE — Progress Notes (Signed)
HISTORY OF PRESENT ILLNESS  Cassandra Daniels is a 49 y.o. female.     Patient presents with   ??? Other     F/U for Chest Congestion     Seen last week for a sore throat, saw Dr. Betha Loa, treated with keflex 500 mg tid, started coughing 2 days lated, given tussionex and now off abx for 2 days and still with cough has gotten deeper, and co aching in the neck worried about upcoming travel to [place] for new grandchild coming.  Cough has some mucus but difficult to produce, mild wheezing, no history of asthma, no sob    HPIabove    Review of Systems   Constitutional: Negative for fever and chills.   HENT: Positive for sore throat. Negative for congestion.    Respiratory: Positive for cough, sputum production and wheezing. Negative for hemoptysis and shortness of breath.      Current outpatient prescriptions ordered prior to encounter   Medication Sig Dispense Refill   ??? chlorpheniramine-hydrocodone (TUSSIONEX) 8-10 mg/5 mL suspension TAKE 5 ML BY MOUTH TWICE DAILY  140 mL  0   ??? Levocetirizine (XYZAL) 5 mg tablet Take 5 mg by mouth daily.  30 Tab  0   ??? CYANOCOBALAMIN, VITAMIN B-12, (VITAMIN B-12 PO) Take  by mouth daily.       ??? fexofenadine-pseudoephedrine (ALLEGRA-D 12 HOUR) 60-120 mg per tablet Take 1 Tab by mouth two (2) times a day for 30 days.  60 Tab  0   ??? primidone (MYSOLINE) 50 mg tablet Take 1 Tab by mouth as directed. One in pm and 1/2 in am  135 Tab  3   ??? hyoscyamine SL (LEVSIN/SL) 0.125 mg SL tablet PLACE 1 TABLET UNDER TONGUE EVERY 4 HOURS AS NEEDED  30 Tab  2   ??? amitriptyline (ELAVIL) 25 mg tablet Take  by mouth nightly.       ??? venlafaxine-SR (EFFEXOR XR) 150 mg capsule Take  by mouth daily.       ??? ERGOCALCIFEROL (VITAMIN D PO) Take 1,000 Units by mouth two (2) times a day.       ??? TRAMADOL HCL (TRAMADOL PO) Take 50 mg by mouth. Take 150mg  BID prn           Allergies   Allergen Reactions   ??? Tetracycline Unknown (comments)         Physical Exam    Constitutional: She appears well-developed and well-nourished.   HENT:   Right Ear: External ear normal.   Left Ear: External ear normal.   Mouth/Throat: Oropharynx is clear and moist. No oropharyngeal exudate.   Neck: Normal range of motion. Neck supple. No JVD present.   Cardiovascular: Normal rate, regular rhythm and normal heart sounds.    Pulmonary/Chest: Effort normal and breath sounds normal. No respiratory distress. She has no wheezes. She has no rales.   Lymphadenopathy:     She has cervical adenopathy.   Psychiatric: She has a normal mood and affect. Her behavior is normal.       ASSESSMENT and PLAN  1. Cough (786.2)  azithromycin (ZITHROMAX) 250 mg tablet, chlorpheniramine-hydrocodone (TUSSIONEX PENNKINETIC ER) 8-10 mg/5 mL suspension   2. URI, acute (465.9X)  azithromycin (ZITHROMAX) 250 mg tablet, chlorpheniramine-hydrocodone (TUSSIONEX PENNKINETIC ER) 8-10 mg/5 mL suspension   3. Pharyngitis (462Y)  Improved some after keflex

## 2009-12-17 MED ORDER — CHLORZOXAZONE 500 MG TAB
500 mg | ORAL_TABLET | Freq: Three times a day (TID) | ORAL | Status: AC | PRN
Start: 2009-12-17 — End: 2009-12-27

## 2010-01-16 MED ORDER — AMITRIPTYLINE 25 MG TAB
25 mg | ORAL_TABLET | Freq: Every evening | ORAL | Status: DC
Start: 2010-01-16 — End: 2010-06-24

## 2010-01-16 MED ORDER — PRIMIDONE 50 MG TAB
50 mg | ORAL_TABLET | ORAL | Status: DC
Start: 2010-01-16 — End: 2010-08-21

## 2010-01-16 MED ORDER — VENLAFAXINE SR 150 MG 24 HR CAP
150 mg | ORAL_CAPSULE | Freq: Every day | ORAL | Status: DC
Start: 2010-01-16 — End: 2010-05-30

## 2010-02-05 LAB — METABOLIC PANEL, BASIC
Anion gap: 7 mmol/L (ref 5–15)
BUN/Creatinine ratio: 13 (ref 12–20)
BUN: 13 MG/DL (ref 7–18)
CO2: 27 MMOL/L (ref 21–32)
Calcium: 9.3 MG/DL (ref 8.4–10.4)
Chloride: 108 MMOL/L (ref 100–108)
Creatinine: 1 MG/DL (ref 0.6–1.3)
GFR est AA: >60 mL/min/{1.73_m2} (ref >60)
GFR est non-AA: >60 mL/min/{1.73_m2} (ref >60)
Glucose: 112 MG/DL — ABNORMAL HIGH (ref 74–99)
Potassium: 3.6 MMOL/L (ref 3.5–5.5)
Sodium: 142 MMOL/L (ref 136–145)

## 2010-02-05 LAB — CBC WITH AUTOMATED DIFF
ABS. BASOPHILS: 0 10*3/uL (ref 0.0–0.1)
ABS. EOSINOPHILS: 0.1 10*3/uL (ref 0.0–0.4)
ABS. LYMPHOCYTES: 1.5 10*3/uL (ref 0.9–3.6)
ABS. MONOCYTES: 0.6 10*3/uL (ref 0.05–1.2)
ABS. NEUTROPHILS: 5.7 10*3/uL (ref 1.8–8.0)
BASOPHILS: 0 % (ref 0–2)
EOSINOPHILS: 1 % (ref 0–5)
HCT: 43.2 % (ref 35.0–45.0)
HGB: 14 g/dL (ref 12.0–16.0)
LYMPHOCYTES: 19 % — ABNORMAL LOW (ref 21–52)
MCH: 29.6 PG (ref 24.0–34.0)
MCHC: 32.4 g/dL (ref 31.0–37.0)
MCV: 91.3 FL (ref 74.0–97.0)
MONOCYTES: 7 % (ref 3–10)
MPV: 12 FL — ABNORMAL HIGH (ref 9.2–11.8)
NEUTROPHILS: 73 % (ref 40–73)
PLATELET: 245 10*3/uL (ref 135–420)
RBC: 4.73 M/uL (ref 4.20–5.30)
RDW: 13.9 % (ref 11.6–14.5)
WBC: 7.8 10*3/uL (ref 4.6–13.2)

## 2010-03-04 MED ORDER — ZOLPIDEM SR 12.5 MG MULTIPHASE TAB
12.5 mg | ORAL_TABLET | ORAL | Status: DC
Start: 2010-03-04 — End: 2010-11-08

## 2010-05-06 NOTE — Telephone Encounter (Signed)
Scheduled to see Dr. Doristine Johns (card) on 05/09/10 at 12:20 pm

## 2010-05-09 NOTE — Progress Notes (Addendum)
Addended by: Tarri Fuller L on: 05/09/2010      Modules accepted: Orders

## 2010-05-09 NOTE — Patient Instructions (Addendum)
Echo:  [facility] [address]    October 14th @ 3:30pm    If you need to resch please call [phone]
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
## 2010-05-29 LAB — METABOLIC PANEL, BASIC
BUN/Creatinine ratio: 9 (ref 9–23)
BUN: 7 mg/dL (ref 6–24)
CO2: 24 mmol/L (ref 20–32)
Calcium: 9.1 mg/dL (ref 8.7–10.2)
Chloride: 105 mmol/L (ref 97–108)
Creatinine: 0.78 mg/dL (ref 0.57–1.00)
GFR est AA: 103 mL/min/{1.73_m2} (ref >59)
GFR est non-AA: 89 mL/min/{1.73_m2} (ref >59)
Glucose: 98 mg/dL (ref 65–99)
Potassium: 4 mmol/L (ref 3.5–5.2)
Sodium: 143 mmol/L (ref 135–145)

## 2010-05-29 LAB — VITAMIN B12: Vitamin B12: 615 pg/mL (ref 211–946)

## 2010-05-29 LAB — HEPATIC FUNCTION PANEL
ALT (SGPT): 16 IU/L (ref 0–40)
AST (SGOT): 19 IU/L (ref 0–40)
Albumin: 4.1 g/dL (ref 3.5–5.5)
Alk. phosphatase: 84 IU/L (ref 25–150)
Bilirubin, direct: 0.09 mg/dL (ref 0.00–0.40)
Bilirubin, total: 0.3 mg/dL (ref 0.0–1.2)
Protein, total: 6.4 g/dL (ref 6.0–8.5)

## 2010-05-29 LAB — CBC WITH AUTOMATED DIFF
ABS. BASOPHILS: 0 10*3/uL (ref 0.0–0.2)
ABS. EOSINOPHILS: 0 10*3/uL (ref 0.0–0.4)
ABS. IMM. GRANS.: 0 10*3/uL (ref 0.0–0.1)
ABS. LYMPHOCYTES: 1.2 10*3/uL (ref 0.7–4.5)
ABS. MONOCYTES: 0.5 10*3/uL (ref 0.1–1.0)
ABS. NEUTROPHILS: 3.4 10*3/uL (ref 1.8–7.8)
BASOPHILS: 0 % (ref 0–3)
EOSINOPHILS: 1 % (ref 0–7)
HCT: 42.1 % (ref 34.0–44.0)
HGB: 13.7 g/dL (ref 11.5–15.0)
IMMATURE GRANULOCYTES: 0 % (ref 0–2)
LYMPHOCYTES: 24 % (ref 14–46)
MCH: 29 pg (ref 27.0–34.0)
MCHC: 32.5 g/dL (ref 32.0–36.0)
MCV: 89 fL (ref 80–98)
MONOCYTES: 10 % (ref 4–13)
NEUTROPHILS: 65 % (ref 40–74)
PLATELET: 250 10*3/uL (ref 140–415)
RBC: 4.72 x10E6/uL (ref 3.80–5.10)
RDW: 14.8 % (ref 11.7–15.0)
WBC: 5.2 10*3/uL (ref 4.0–10.5)

## 2010-05-29 LAB — LIPID PANEL
Cholesterol, total: 186 mg/dL (ref 100–199)
HDL Cholesterol: 84 mg/dL (ref >39)
LDL, calculated: 80 mg/dL (ref 0–99)
Triglyceride: 112 mg/dL (ref 0–149)
VLDL, calculated: 22 mg/dL (ref 5–40)

## 2010-05-29 LAB — TSH 3RD GENERATION: TSH: 0.685 u[IU]/mL (ref 0.450–4.500)

## 2010-05-29 LAB — MAGNESIUM: Magnesium: 2 mg/dL (ref 1.6–2.6)

## 2010-05-29 NOTE — Telephone Encounter (Signed)
I had a long discussion with the patient she is on amitriptyline and tramadol and Effexor and that she is at risk for serotonin syndrome which could be life threatening and she said she will cut all in half and reduce her use of tramadol and she understands unless she stop 2 of them she would still be at risk she said that her OBGYN has doubled her effexor dose last year for menopause symptoms

## 2010-05-30 MED ORDER — VENLAFAXINE SR 150 MG 24 HR CAP
150 mg | ORAL_CAPSULE | Freq: Every day | ORAL | Status: DC
Start: 2010-05-30 — End: 2010-11-15

## 2010-06-04 NOTE — Telephone Encounter (Signed)
Pt aware of results and also all labs which were normal..Consuella Scurlock Anise Salvo, CMA      Echo looks good. Minimal prolapse. Ryan    ----- Message -----  From: Lynnell Dike  Sent: 05/28/2010 1:36 PM  To: Arletha Pili, MD    Echo done for history of mitral valve prolapse with a faint murmur.Marland KitchenMarland KitchenTarri Fuller, CMA

## 2010-06-04 NOTE — Progress Notes (Addendum)
Quick Note:    Normal labs, pt aware..Devine Dant, CMA    ______

## 2010-06-24 NOTE — Telephone Encounter (Signed)
Pt doesnot sleep well wants to increase amitr. Advised against counseled re worsening

## 2010-06-25 MED ORDER — AMITRIPTYLINE 25 MG TAB
25 mg | ORAL_TABLET | Freq: Every evening | ORAL | Status: DC
Start: 2010-06-25 — End: 2010-08-21

## 2010-07-12 MED ORDER — HYDROCODONE 10 MG-CHLORPHENIRAMINE 8 MG/5 ML ORAL SUSP EXTEND.REL 12HR
10-8 mg/5 mL | ORAL | Status: DC
Start: 2010-07-12 — End: 2010-08-09

## 2010-07-17 NOTE — Telephone Encounter (Signed)
Landis Martins ','<<< Less Detail   From Georgann Housekeeper   Sent Friday July 12, 2010 4:04 PM   To Selinda Michaels   Cc Carlynn Herald, MD   Phone [phone]   Subject Sean Malinowski   Patient Cassandra Daniels [[id number]] (DOB: 10-07-1960)   Phone Entered Pt Work Pt Home     [phone]           Message Diane from [facility] in [place] for [facility] when calling. Pt needs a refill on Amitriptyline 25 mg 1/2 tablet night every evening. Pt states she taking a full tablet. They need to verify directions. Primidone 1/2 in the am and 1 in the evening 50 mg Pt states she is taking 1 in the am and 2 in the pm. The need to verify directions. Pt would like 90 day supply.

## 2010-08-09 MED ORDER — FLUTICASONE-SALMETEROL 250 MCG-50 MCG/DOSE DISK DEVICE FOR INHALATION
250-50 mcg/dose | Freq: Two times a day (BID) | RESPIRATORY_TRACT | Status: DC
Start: 2010-08-09 — End: 2011-04-07

## 2010-08-09 MED ORDER — AZITHROMYCIN 250 MG TAB
250 mg | ORAL_TABLET | ORAL | Status: DC
Start: 2010-08-09 — End: 2010-08-13

## 2010-08-09 MED ORDER — HYDROCODONE 10 MG-CHLORPHENIRAMINE 8 MG/5 ML ORAL SUSP EXTEND.REL 12HR
10-8 mg/5 mL | Freq: Every evening | ORAL | Status: DC
Start: 2010-08-09 — End: 2010-08-13

## 2010-08-09 MED ORDER — FEXOFENADINE-PSEUDOEPHEDRINE SR 60 MG-120 MG 12 HR TAB
60-120 mg | ORAL_TABLET | Freq: Two times a day (BID) | ORAL | Status: DC
Start: 2010-08-09 — End: 2010-08-13

## 2010-08-13 MED ORDER — RANITIDINE 150 MG TAB
150 mg | ORAL_TABLET | Freq: Two times a day (BID) | ORAL | Status: DC
Start: 2010-08-13 — End: 2010-09-20

## 2010-08-13 MED ORDER — HYDROCODONE 10 MG-CHLORPHENIRAMINE 8 MG/5 ML ORAL SUSP EXTEND.REL 12HR
10-8 mg/5 mL | Freq: Every evening | ORAL | Status: DC
Start: 2010-08-13 — End: 2010-08-23

## 2010-08-13 MED ORDER — PREDNISONE 10 MG TABLETS IN A DOSE PACK
10 mg | ORAL_TABLET | ORAL | Status: DC
Start: 2010-08-13 — End: 2010-09-20

## 2010-08-13 MED ORDER — CYCLOBENZAPRINE 10 MG TAB
10 mg | ORAL_TABLET | Freq: Three times a day (TID) | ORAL | Status: DC
Start: 2010-08-13 — End: 2011-04-07

## 2010-08-13 MED ORDER — CEPHALEXIN 500 MG CAP
500 mg | ORAL_CAPSULE | Freq: Three times a day (TID) | ORAL | Status: AC
Start: 2010-08-13 — End: 2010-08-20

## 2010-08-13 NOTE — Progress Notes (Signed)
Chief Complaint   Patient presents with   ??? Cough     productive   ??? Neck Pain

## 2010-08-13 NOTE — Progress Notes (Signed)
Chief Complaint   Patient presents with   ??? Cough     productive   ??? Neck Pain       HISTORY OF THE PRESENT ILLNESS:  Cassandra Daniels is a 50 y.o. female      Past Medical History   Diagnosis Date   ??? Arthritis    ??? Fibrocystic breast    ??? Mitral (valve) prolapse    ??? Chronic fatigue    ??? Vitamin B 12 deficiency    ??? Paroxysmal atrial tachycardia    ??? HTN    ??? Meningioma NOS    ??? Broken nose 1999     surgery   ??? Echocardiogram 05/05/08     WNL; no valvular disease   ??? Myocardial perfusion 06/21/04     No evidence of scarring or ischemia; EF >80%   ??? Atrial fibrillation    ??? Essential hypertension    ??? MVP (mitral valve prolapse)    ??? SVT (supraventricular tachycardia)        Family History   Problem Relation Age of Onset   ??? Asthma Mother    ??? Breast Cancer Mother    ??? Cancer Mother    ??? Other Father      cabg   ??? Heart Disease Father    ??? High Cholesterol Father    ??? Heart Attack Maternal Grandmother 74       History   Social History   ??? Marital Status: Married     Spouse Name: N/A     Number of Children: N/A   ??? Years of Education: N/A   Occupational History   ??? Not on file.   Social History Main Topics   ??? Smoking status: Never Smoker    ??? Smokeless tobacco: Never Used   ??? Alcohol Use: No   ??? Drug Use: No   ??? Sexually Active:    Other Topics Concern   ??? Not on file   Social History Narrative   ??? No narrative on file       MEDICATIONS:  Current outpatient prescriptions   Medication Sig   ??? HYDROcodone-acetaminophen (VICODIN) 5-500 mg per tablet Take  by mouth as needed.   ??? cephALEXin (KEFLEX) 500 mg capsule Take 1 Cap by mouth three (3) times daily for 7 days.   ??? chlorpheniramine-HYDROcodone (TUSSIONEX) 8-10 mg/5 mL suspension Take 5 mL by mouth every evening.   ??? ranitidine (ZANTAC) 150 mg tablet Take 1 Tab by mouth two (2) times a day for 30 days.   ??? cyclobenzaprine (FLEXERIL) 10 mg tablet Take 1 Tab by mouth three (3) times daily (with meals).    ??? fluticasone-salmeterol (ADVAIR) 250-50 mcg/dose diskus inhaler Take 1 Puff by inhalation every twelve (12) hours.   ??? amitriptyline (ELAVIL) 25 mg tablet Take 0.5 Tabs by mouth nightly.   ??? venlafaxine-SR (EFFEXOR XR) 150 mg capsule Take 1 Cap by mouth daily.   ??? zolpidem CR (AMBIEN CR) 12.5 mg tablet TAKE ONE TABLET BY MOUTH EVERY NIGHT AT BEDTIME AS NEEDED   ??? primidone (MYSOLINE) 50 mg tablet Take 1 Tab by mouth as directed. One in pm and 1/2 in am   ??? CYANOCOBALAMIN, VITAMIN B-12, (VITAMIN B-12 PO) Take  by mouth daily.   ??? hyoscyamine SL (LEVSIN/SL) 0.125 mg SL tablet PLACE 1 TABLET UNDER TONGUE EVERY 4 HOURS AS NEEDED   ??? ERGOCALCIFEROL (VITAMIN D PO) Take 1,000 Units by mouth two (2) times a day.   ???  TRAMADOL HCL (TRAMADOL PO) Take 50 mg by mouth. Take 150mg  BID prn       Medications Discontinued During This Encounter   Medication Reason   ??? azithromycin (ZITHROMAX) 250 mg tablet    ??? fexofenadine-pseudoephedrine (ALLEGRA-D 12 HOUR) 60-120 mg per tablet    ??? chlorpheniramine-HYDROcodone (TUSSIONEX) 8-10 mg/5 mL suspension Reorder       Allergies   Allergen Reactions   ??? Tetracycline Unknown (comments)   ??? Beta Blocker (Beta-blockers (Beta-adrenergic Blocking Agts)) Other (comments)     Fatigue       REVIEW OF SYSTEMS:  CVS:    No Chest pain or Palpitations  Respiratory:  cough  PHYSICAL EXAMINATION:  Constitutional: She is oriented. She appears well-developed and well-nourished.   BP 133/86   Pulse 99   Temp(Src) 98.4 ??F (36.9 ??C) (Oral)   Ht 5' 2.5" (1.588 m)   Wt 174 lb 4 oz (79.039 kg)   BMI 31.36 kg/m2  HENT: ??  Head: Normocephalic and atraumatic.   Nose: Nose normal. ??  Mouth/Throat: Oropharynx is clear and moist. No oropharyngeal exudate.   Eyes: Conjunctivae and extraocular motions are normal. Pupils are equal, round, and reactive to light. Right eye exhibits no discharge. Left eye exhibits no discharge. No scleral icterus.    Neck: Neck supple. No thyromegaly present. She has no cervical adenopathy.   Cardiovascular: Normal rate, regular rhythm and normal heart sounds.?? Exam reveals no gallop and no friction rub.?? ??  No murmur heard.  Extremities no edema.  Pulmonary/Chest: Effort normal and breath sounds normal. No respiratory distress. She has no wheezes. She has no rales.   Abdominal: Soft. Bowel sounds are normal. She exhibits no distension and no mass. No tenderness. She has no rebound and no guarding.     LABORATORY AND IMAGING DATA:  Results for orders placed in visit on 05/09/10   CBC WITH AUTOMATED DIFF   Component Value Range   ??? WBC 5.2  4.0 - 10.5 (x10E3/uL)   ??? RBC 4.72  3.80 - 5.10 (x10E6/uL)   ??? HGB 13.7  11.5 - 15.0 (g/dL)   ??? HCT 42.1  34.0 - 44.0 (%)   ??? MCV 89  80 - 98 (fL)   ??? MCH 29.0  27.0 - 34.0 (pg)   ??? MCHC 32.5  32.0 - 36.0 (g/dL)   ??? RDW 14.8  11.7 - 15.0 (%)   ??? PLATELET 250  140 - 415 (x10E3/uL)   ??? NEUTROPHILS 65  40 - 74 (%)   ??? LYMPHOCYTES 24  14 - 46 (%)   ??? MONOCYTES 10  4 - 13 (%)   ??? EOSINOPHILS 1  0 - 7 (%)   ??? BASOPHILS 0  0 - 3 (%)   ??? Immature cells CANCELED     ??? ABSOLUTE NEUTS 3.4  1.8 - 7.8 (x10E3/uL)   ??? ABSOLUTE LYMPHS 1.2  0.7 - 4.5 (x10E3/uL)   ??? ABSOLUTE MONOS 0.5  0.1 - 1.0 (x10E3/uL)   ??? ABSOLUTE EOSINS 0.0  0.0 - 0.4 (x10E3/uL)   ??? ABSOLUTE BASOS 0.0  0.0 - 0.2 (x10E3/uL)   ??? IMM. GRANS. 0  0 - 2 (%)   ??? ABS. IMM. GRANS. 0.0  0.0 - 0.1 (x10E3/uL)   ??? NRBC CANCELED     ??? Hematology Comments: CANCELED     METABOLIC PANEL, BASIC   Component Value Range   ??? Glucose 98  65 - 99 (mg/dL)   ??? BUN 7  6 -  24 (mg/dL)   ??? Creatinine 0.78  0.57 - 1.00 (mg/dL)   ??? GFR est non-AA 89  >59 (mL/min/1.73)   ??? GFR est AA 103  >59 (mL/min/1.73)   ??? BUN/Creatinine ratio 9  9 - 23    ??? Sodium 143  135 - 145 (mmol/L)   ??? Potassium 4.0  3.5 - 5.2 (mmol/L)   ??? Chloride 105  97 - 108 (mmol/L)   ??? CO2 24  20 - 32 (mmol/L)   ??? Calcium 9.1  8.7 - 10.2 (mg/dL)   TSH, 3RD GENERATION   Component Value Range    ??? TSH, 3rd generation 0.685  0.450 - 4.500 (uIU/mL)   MAGNESIUM   Component Value Range   ??? Magnesium 2.0  1.6 - 2.6 (mg/dL)   HEPATIC FUNCTION PANEL   Component Value Range   ??? Protein, total 6.4  6.0 - 8.5 (g/dL)   ??? Albumin 4.1  3.5 - 5.5 (g/dL)   ??? Bilirubin, total 0.3  0.0 - 1.2 (mg/dL)   ??? Bilirubin, direct 0.09  0.00 - 0.40 (mg/dL)   ??? Alk. phosphatase 84  25 - 150 (IU/L)   ??? AST 19  0 - 40 (IU/L)   ??? ALT 16  0 - 40 (IU/L)   LIPID PANEL   Component Value Range   ??? Cholesterol, total 186  100 - 199 (mg/dL)   ??? Triglyceride 112  0 - 149 (mg/dL)   ??? HDL Cholesterol 84  >39 (mg/dL)   ??? VLDL, calculated 22  5 - 40 (mg/dL)   ??? LDL, calculated 80  0 - 99 (mg/dL)   VITAMIN O13   Component Value Range   ??? Vitamin B12 615  211 - 946 (pg/mL)         ASSESSMENT AND PLAN:    1. Bronchitis (490H)    2. Neck pain (723.1B)          Orders Placed This Encounter   ??? HYDROcodone-acetaminophen (VICODIN) 5-500 mg per tablet   ??? cephALEXin (KEFLEX) 500 mg capsule   ??? chlorpheniramine-HYDROcodone (TUSSIONEX) 8-10 mg/5 mL suspension   ??? ranitidine (ZANTAC) 150 mg tablet   ??? cyclobenzaprine (FLEXERIL) 10 mg tablet         continue current, medication changes and labs as above

## 2010-08-16 NOTE — Telephone Encounter (Signed)
Pt came into office this morning requesting an order for a chest xray. She stated Dr. Betha Loa told her on her last visit that if she was not feeling any better he would order a xray. Dr. Lamar Benes looked at his last office notes (did not see any mention of a chest xray order)and noting that she had been treated with 2 antibiotics and a steroid and stated that pt would need to be evaluated before an order could be written but due to a full schedule she would need to be seen by an urgent care facility today or she could come in the office next week if her symptoms were not acute. Upon informing pt of Dr. Lamar Benes advice, pt became upset making statement that she was going to inform her insurance company that she was refused treatment. I informed pt that we weren't refusing to see her but because we did not have any openings this afternoon due to a full schedule, it was appropriate to advise her to seek treatment at an urgent care if she felt she could not wait until next week.

## 2010-08-21 NOTE — Progress Notes (Signed)
Chief Complaint   Patient presents with   ??? Fever     at home fever has been running between 100.4/100.7 degree F off and on with high fevers for past 2 weeks   ??? Cough     for past 2 weeks    ??? Hospital Follow Up     patient went to [facility] on [address] on 08/17/10 due to on going cold since seeing Dr. Betha Loa 08/13/10 patient at that time was being put on second course of medications       Patient c/o fever at home for past 2 weeks that comes and goes ranging from 100.4-100.7 degree F.  Patient c/o on going cough for past 2 weeks  Patient states after seeing Dr. Betha Loa 08/13/10 patient needed to go into [facility] facility on [address] on 08/17/10 due to on going cough, fever, patient had chest x-ray, blood work, diagnosed with having Bronchitis and treated with Prednisone medication.  HISTORY OF PRESENT ILLNESS  Cassandra Daniels is a 50 y.o. female.  HPI  Has been ill since 08/08/10.  Started with a sore throat.  Her relatives that she was visiting had been ill also.  Called pcp on 07/10/10, was given an rx for 5 day zpack.  On 08/13/10, came in and saw her pcp.  Keflex was added at this point, along with advair.  He felt that she had bronchitis.  On 08/17/10 she was seen at [facility] for ongoing cough and fevers.  Had a cxr that was wnl.  New rx for a prednisone taper was given.  Since then, she is still coughing and having fevers.  Gets some vertigo in the afternoon also.  Has had some congestion and feels poorly overall.    Review of Systems   Constitutional: Positive for fever and malaise/fatigue.   HENT: Positive for congestion.    Respiratory: Positive for cough.    Cardiovascular: Negative.    Gastrointestinal: Negative.    Musculoskeletal: Negative.    Neurological: Positive for dizziness.   Psychiatric/Behavioral: Negative.        Physical Exam   Nursing note and vitals reviewed.  Constitutional: She is oriented to person, place, and time. She appears well-developed and well-nourished.   HENT:    Head: Normocephalic and atraumatic.   Right Ear: Hearing, tympanic membrane, external ear and ear canal normal.   Left Ear: Hearing, tympanic membrane, external ear and ear canal normal.   Nose: Mucosal edema and rhinorrhea present. Right sinus exhibits no maxillary sinus tenderness and no frontal sinus tenderness. Left sinus exhibits no maxillary sinus tenderness and no frontal sinus tenderness.   Mouth/Throat: Uvula is midline, oropharynx is clear and moist and mucous membranes are normal.   Eyes: Conjunctivae and EOM are normal.   Neck: Normal range of motion. Neck supple. No JVD present. Carotid bruit is not present. No thyromegaly present.   Cardiovascular: Normal rate, regular rhythm, normal heart sounds and intact distal pulses.  Exam reveals no gallop and no friction rub.    No murmur heard.  Pulmonary/Chest: Effort normal and breath sounds normal. No accessory muscle usage. No respiratory distress. She has no decreased breath sounds. She has no wheezes. She has no rhonchi. She has no rales.   Abdominal: Soft.   Musculoskeletal: Normal range of motion.   Lymphadenopathy:     She has no cervical adenopathy.   Neurological: She is alert and oriented to person, place, and time. Coordination normal.   Skin:  Skin is warm and dry.   Psychiatric: She has a normal mood and affect. Her behavior is normal. Judgment and thought content normal.       ASSESSMENT and PLAN  Cassandra Daniels was seen today for fever, cough and hospital follow up.    Diagnoses and associated orders for this visit:    Bronchitis  Cont care plan as per urgent care.      Med list update:  - amitriptyline (ELAVIL) 25 mg tablet; Take 25 mg by mouth nightly.  - primidone (MYSOLINE) 50 mg tablet; Take 50 mg by mouth. Take 1 tablet by mouth every am.Take 2 tablet by mouth every night.   - Cholecalciferol, Vitamin D3, (VITAMIN D) 1,000 unit Cap; Take 1,000 Int'l Units by mouth daily.        Follow-up Disposition:  Return if symptoms worsen or fail to improve.

## 2010-08-21 NOTE — Telephone Encounter (Signed)
08/21/10 per Dr. Luiz Blare [facility] facility reached and request for records to be sent from her visit in their facility on 08/17/10.   Representative states she will fax them now, and document received.

## 2010-08-23 MED ORDER — HYDROCODONE 10 MG-CHLORPHENIRAMINE 8 MG/5 ML ORAL SUSP EXTEND.REL 12HR
10-8 mg/5 mL | Freq: Two times a day (BID) | ORAL | Status: DC | PRN
Start: 2010-08-23 — End: 2010-09-20

## 2010-08-23 NOTE — Telephone Encounter (Signed)
Pt called office today requesting a refill on Tussionex be called to [facility]. Initially Dr. Lamar Benes approved the refill.  Received call from pharmacist stating that pt was prescribed this med on 08/18/10 from an urgent care and she picked it up. She was given a 12 day supply and therefore it was too early to refill subsequently pharmacist was told not to fill rx. Informed pt that request was being denied because it was too early, she wanted pharmacist to hold script so she could have it filled on 08/25/10. Told her I could not do this. In reviewing pt's record it was noted pt was given Tussionex at her visit on 08/13/10 for a 12 day supply.  Based on these dates pt should have had enough medication to last until 09/06/10.

## 2010-09-20 LAB — AMB POC RAPID STREP A: Group A Strep Ag: NEGATIVE

## 2010-09-20 MED ORDER — RANITIDINE 150 MG TAB
150 mg | ORAL_TABLET | Freq: Two times a day (BID) | ORAL | Status: AC
Start: 2010-09-20 — End: 2010-10-20

## 2010-09-20 MED ORDER — HYDROCODONE 10 MG-CHLORPHENIRAMINE 8 MG/5 ML ORAL SUSP EXTEND.REL 12HR
10-8 mg/5 mL | Freq: Two times a day (BID) | ORAL | Status: DC | PRN
Start: 2010-09-20 — End: 2011-04-07

## 2010-09-20 MED ORDER — CEPHALEXIN 500 MG CAP
500 mg | ORAL_CAPSULE | Freq: Three times a day (TID) | ORAL | Status: AC
Start: 2010-09-20 — End: 2010-09-27

## 2010-09-20 NOTE — Progress Notes (Signed)
Chief Complaint   Patient presents with   ??? Sore Throat   ??? Cough

## 2010-09-30 NOTE — Telephone Encounter (Signed)
Carlynn Herald, MD - RE: Betha Loa << Less Detail       RE: Edmund Hilda, MD         Sent: Mon September 30, 2010  1:13 PM     To: Cordelia Poche; Selinda Michaels     Cc: Carlynn Herald, MD          Elonda Giuliano     MRN: [id number] DOB: [birth year]     Pt Work: [phone] Pt Home: [phone]     Entered: [phone]                     Message      Tina please call her and tell her she has appointment with Dr. Hetty Blend to look at cyst site and remove stitches on Monday  Feb 27 at 3 pm     ----- Message -----     From: Cordelia Poche     Sent: 09/30/2010  12:14 PM       To: Carlynn Herald, MD, Selinda Michaels  Subject: Nellie Pester                                            Pt request dr Betha Loa to call her

## 2010-09-30 NOTE — Progress Notes (Signed)
Chief Complaint   Patient presents with   ??? Sore Throat   ??? Cough     HISTORY OF THE PRESENT ILLNESS:  Cassandra Daniels is a 50 y.o. female      Past Medical History   Diagnosis Date   ??? Arthritis    ??? Fibrocystic breast    ??? Mitral (valve) prolapse    ??? Chronic fatigue    ??? Vitamin B 12 deficiency    ??? Paroxysmal atrial tachycardia    ??? HTN    ??? Meningioma NOS    ??? Broken nose 1999     surgery   ??? Echocardiogram 05/05/08     WNL; no valvular disease   ??? Myocardial perfusion 06/21/04     No evidence of scarring or ischemia; EF >80%   ??? Atrial fibrillation    ??? Essential hypertension    ??? MVP (mitral valve prolapse)    ??? SVT (supraventricular tachycardia)      Family History   Problem Relation Age of Onset   ??? Asthma Mother    ??? Breast Cancer Mother    ??? Cancer Mother    ??? Other Father      cabg   ??? Heart Disease Father    ??? High Cholesterol Father    ??? Heart Attack Maternal Grandmother 11     History     Social History   ??? Marital Status: Married     Spouse Name: N/A     Number of Children: N/A   ??? Years of Education: N/A     Occupational History   ??? Not on file.     Social History Main Topics   ??? Smoking status: Never Smoker    ??? Smokeless tobacco: Never Used   ??? Alcohol Use: No   ??? Drug Use: Yes     Special: Prescription, OTC   ??? Sexually Active: Not on file     Other Topics Concern   ??? Not on file     Social History Narrative   ??? No narrative on file     MEDICATIONS:  Current Outpatient Prescriptions   Medication Sig   ??? chlorpheniramine-HYDROcodone (TUSSIONEX) 8-10 mg/5 mL suspension Take 5 mL by mouth every twelve (12) hours as needed for Cough.   ??? ranitidine (ZANTAC) 150 mg tablet Take 1 Tab by mouth two (2) times a day for 30 days.   ??? amitriptyline (ELAVIL) 25 mg tablet Take 25 mg by mouth nightly.   ??? primidone (MYSOLINE) 50 mg tablet Take 50 mg by mouth. Take 1 tablet by mouth every am.Take 2 tablet by mouth every night.     ??? Cholecalciferol, Vitamin D3, (VITAMIN D) 1,000 unit Cap Take 1,000 Int'l Units by mouth daily.   ??? HYDROcodone-acetaminophen (VICODIN) 5-500 mg per tablet Take  by mouth as needed.   ??? cyclobenzaprine (FLEXERIL) 10 mg tablet Take 1 Tab by mouth three (3) times daily (with meals).   ??? fluticasone-salmeterol (ADVAIR) 250-50 mcg/dose diskus inhaler Take 1 Puff by inhalation every twelve (12) hours.   ??? venlafaxine-SR (EFFEXOR XR) 150 mg capsule Take 1 Cap by mouth daily.   ??? zolpidem CR (AMBIEN CR) 12.5 mg tablet TAKE ONE TABLET BY MOUTH EVERY NIGHT AT BEDTIME AS NEEDED   ??? CYANOCOBALAMIN, VITAMIN B-12, (VITAMIN B-12 PO) Take  by mouth daily.   ??? hyoscyamine SL (LEVSIN/SL) 0.125 mg SL tablet PLACE 1 TABLET UNDER TONGUE EVERY 4 HOURS AS NEEDED   ??? TRAMADOL  HCL (TRAMADOL PO) Take 50 mg by mouth. Take 150mg  BID prn     Medications Discontinued During This Encounter   Medication Reason   ??? chlorpheniramine-HYDROcodone (TUSSIONEX) 8-10 mg/5 mL suspension    ??? predniSONE (STERAPRED DS) 10 mg dose pack    ??? ranitidine (ZANTAC) 150 mg tablet Reorder     Allergies   Allergen Reactions   ??? Beta Blocker (Beta-Blockers (Beta-Adrenergic Blocking Agts)) Other (comments)     Fatigue   ??? Tetracycline Unknown (comments)     REVIEW OF SYSTEMS:  CVS:    No Chest pain or Palpitations  Respiratory:  cough  PHYSICAL EXAMINATION:  Constitutional: She is oriented. She appears well-developed and well-nourished.   BP 129/92   Pulse 98   Temp(Src) 98.6 ??F (37 ??C) (Oral)   Ht 5' 2.5" (1.588 m)   Wt 173 lb 12 oz (78.812 kg)   BMI 31.27 kg/m2  HENT: ??  Head: Normocephalic and atraumatic.   Nose: Nose normal. ??  Mouth/Throat: Oropharynx is clear and moist. No oropharyngeal exudate.   Eyes: Conjunctivae and extraocular motions are normal. Pupils are equal, round, and reactive to light. Right eye exhibits no discharge. Left eye exhibits no discharge. No scleral icterus.   Neck: Neck supple. No thyromegaly present. She has no cervical adenopathy.    Cardiovascular: Normal rate, regular rhythm and normal heart sounds.?? Exam reveals no gallop and no friction rub.?? ??  No murmur heard.  Extremities no edema.  Pulmonary/Chest: Effort normal and breath sounds normal. No respiratory distress. She has no wheezes. She has no rales.   Abdominal: Soft. Bowel sounds are normal. She exhibits no distension and no mass. No tenderness. She has no rebound and no guarding.     LABORATORY AND IMAGING DATA:  Results for orders placed in visit on 09/20/10   AMB POC RAPID STREP A       Component Value Range    Group A Strep Ag negative         ASSESSMENT AND PLAN:    1. Sore throat        Orders Placed This Encounter   ??? AMB POC RAPID STREP A   ??? cephALEXin (KEFLEX) 500 mg capsule   ??? chlorpheniramine-HYDROcodone (TUSSIONEX) 8-10 mg/5 mL suspension   ??? ranitidine (ZANTAC) 150 mg tablet       continue current, medication changes and labs as above    Patient counseled anbx not necessary but she said she felt was changing into a sinus infection and felt strongly that she  wil not get better without antibiotics

## 2010-10-07 MED ORDER — PRIMIDONE 50 MG TAB
50 mg | ORAL_TABLET | ORAL | Status: DC
Start: 2010-10-07 — End: 2011-01-13

## 2010-11-11 MED ORDER — ZOLPIDEM SR 12.5 MG MULTIPHASE TAB
12.5 mg | ORAL_TABLET | ORAL | Status: DC
Start: 2010-11-11 — End: 2010-11-22

## 2010-11-16 MED ORDER — VENLAFAXINE SR 150 MG 24 HR CAP
150 mg | ORAL_CAPSULE | ORAL | Status: DC
Start: 2010-11-16 — End: 2011-02-24

## 2010-11-16 MED ORDER — AMITRIPTYLINE 25 MG TAB
25 mg | ORAL_TABLET | ORAL | Status: DC
Start: 2010-11-16 — End: 2011-02-04

## 2010-11-25 MED ORDER — ZOLPIDEM SR 12.5 MG MULTIPHASE TAB
12.5 mg | ORAL_TABLET | ORAL | Status: DC
Start: 2010-11-25 — End: 2012-01-13

## 2011-01-13 MED ORDER — PRIMIDONE 50 MG TAB
50 mg | ORAL_TABLET | ORAL | Status: DC
Start: 2011-01-13 — End: 2011-04-07

## 2011-02-03 NOTE — Telephone Encounter (Signed)
Carlynn Herald, MD << Less Detail       Carlynn Herald, MD         Sent: Mon February 03, 2011 12:18 PM     To: Rowe Clack; Selinda Michaels     Cc: Carlynn Herald, MD          Mataya Kilduff     MRN: [id number] DOB: 05/08/1961     Pt Work: [phone] Pt Home: [phone]     Entered: [phone]                     Message      Ordered will give original order to Inetta Fermo to mail to patient let her know to follow up for results  ----- Message -----     From: Rowe Clack     Sent: 02/03/2011  10:40 AM       To: Carlynn Herald, MD, Selinda Michaels    Pt states it is time for her to have a brain MRI.  She wants to know if you can order this for her. [phone]--MW you call tomorrow.

## 2011-02-05 MED ORDER — AMITRIPTYLINE 25 MG TAB
25 mg | ORAL_TABLET | ORAL | Status: DC
Start: 2011-02-05 — End: 2011-04-07

## 2011-02-10 NOTE — Telephone Encounter (Signed)
Faxed over last MRI results to [facility].

## 2011-02-10 NOTE — Telephone Encounter (Signed)
Message copied by Abbie Sons on Mon Feb 10, 2011 11:58 AM  ------       Message from: Leeanne Rio A       Created: Mon Feb 10, 2011 10:29 AM       Regarding: RE: Betha Loa       Contact: [phone]         All we need to send is her last MRI which showed she had a meningioma and that needs to be followed up to make sure  It is not enlarging       You may also use this email              ----- Message -----          From: Georgann Housekeeper          Sent: 02/10/2011   9:52 AM            To: Carlynn Herald, MD, Selinda Michaels       Subject: Noemi Chapel from [facility] Scheduling needs office notes on [place] to authorize an MRI of the brain.  I do not see any recent office notes on this patient.  N#[phone]

## 2011-02-14 NOTE — Telephone Encounter (Signed)
Message copied by Abbie Sons on Fri Feb 14, 2011  2:31 PM  ------       Message from: Leeanne Rio A       Created: Fri Feb 14, 2011  2:20 PM         Please tell patient meningioma stable              ----- Message -----          From: Rad Results In Edi          Sent: 02/14/2011   2:11 PM            To: Carlynn Herald, MD

## 2011-02-14 NOTE — Telephone Encounter (Signed)
Patient made aware.

## 2011-02-25 MED ORDER — VENLAFAXINE SR 150 MG 24 HR CAP
150 mg | ORAL_CAPSULE | ORAL | Status: DC
Start: 2011-02-25 — End: 2011-04-07

## 2011-03-11 MED ORDER — CLONAZEPAM 0.5 MG TAB
0.5 mg | ORAL_TABLET | Freq: Every evening | ORAL | Status: DC | PRN
Start: 2011-03-11 — End: 2011-04-07

## 2011-04-07 MED ORDER — HYOSCYAMINE 0.125 MG SUBLINGUAL TAB
0.125 mg | ORAL_TABLET | Freq: Four times a day (QID) | SUBLINGUAL | Status: AC | PRN
Start: 2011-04-07 — End: ?

## 2011-04-07 MED ORDER — GABAPENTIN 100 MG CAP
100 mg | ORAL_CAPSULE | Freq: Two times a day (BID) | ORAL | Status: DC
Start: 2011-04-07 — End: 2011-07-25

## 2011-04-07 MED ORDER — PRIMIDONE 50 MG TAB
50 mg | ORAL_TABLET | ORAL | Status: DC
Start: 2011-04-07 — End: 2012-01-13

## 2011-04-07 MED ORDER — CLONAZEPAM 0.5 MG TAB
0.5 mg | ORAL_TABLET | Freq: Every evening | ORAL | Status: DC | PRN
Start: 2011-04-07 — End: 2011-12-03

## 2011-04-07 MED ORDER — VENLAFAXINE SR 150 MG 24 HR CAP
150 mg | ORAL_CAPSULE | Freq: Every day | ORAL | Status: DC
Start: 2011-04-07 — End: 2012-01-13

## 2011-04-07 MED ORDER — AMITRIPTYLINE 25 MG TAB
25 mg | ORAL_TABLET | Freq: Every evening | ORAL | Status: DC
Start: 2011-04-07 — End: 2011-11-12

## 2011-04-07 NOTE — Progress Notes (Signed)
Chief Complaint   Patient presents with   ??? Numbness     2nd toe of left foot

## 2011-04-10 NOTE — Progress Notes (Signed)
Chief Complaint   Patient presents with   ??? Numbness     2nd toe of left foot     HISTORY OF THE PRESENT ILLNESS:  Cassandra Daniels is a 50 y.o. female      Past Medical History   Diagnosis Date   ??? Arthritis    ??? Fibrocystic breast    ??? Mitral (valve) prolapse    ??? Chronic fatigue    ??? Vitamin B 12 deficiency    ??? Paroxysmal atrial tachycardia    ??? HTN    ??? Meningioma NOS    ??? Broken nose 1999     surgery   ??? Echocardiogram 05/05/08     WNL; no valvular disease   ??? Myocardial perfusion 06/21/04     No evidence of scarring or ischemia; EF >80%   ??? Atrial fibrillation    ??? Essential hypertension    ??? MVP (mitral valve prolapse)    ??? SVT (supraventricular tachycardia)      Family History   Problem Relation Age of Onset   ??? Asthma Mother    ??? Breast Cancer Mother    ??? Cancer Mother    ??? Other Father      cabg   ??? Heart Disease Father    ??? High Cholesterol Father    ??? Heart Attack Maternal Grandmother 17     History     Social History   ??? Marital Status: Married     Spouse Name: N/A     Number of Children: N/A   ??? Years of Education: N/A     Occupational History   ??? Not on file.     Social History Main Topics   ??? Smoking status: Never Smoker    ??? Smokeless tobacco: Never Used   ??? Alcohol Use: No   ??? Drug Use: Yes     Special: Prescription, OTC   ??? Sexually Active: Not on file     Other Topics Concern   ??? Not on file     Social History Narrative   ??? No narrative on file     MEDICATIONS:  Current Outpatient Prescriptions   Medication Sig   ??? gabapentin (NEURONTIN) 100 mg capsule Take 1 Cap by mouth two (2) times a day.   ??? clonazePAM (KLONOPIN) 0.5 mg tablet Take 1 Tab by mouth nightly as needed.   ??? venlafaxine-SR (EFFEXOR-XR) 150 mg capsule Take 1 Cap by mouth daily.   ??? amitriptyline (ELAVIL) 25 mg tablet Take 1 Tab by mouth nightly.   ??? primidone (MYSOLINE) 50 mg tablet Take 1 Tab by mouth as directed. TAKE 1 TABLET EVERY MORNING AND 2 TABLETS AT BEDTIME    ??? hyoscyamine SL (LEVSIN/SL) 0.125 mg SL tablet Take 1 Tab by mouth every six (6) hours as needed for Cramping.   ??? zolpidem CR (AMBIEN CR) 12.5 mg tablet TAKE 1 TABLET BY MOUTH AT BEDTIME   ??? Cholecalciferol, Vitamin D3, (VITAMIN D) 1,000 unit Cap Take 1,000 Int'l Units by mouth daily.   ??? HYDROcodone-acetaminophen (VICODIN) 5-500 mg per tablet Take  by mouth as needed.   ??? CYANOCOBALAMIN, VITAMIN B-12, (VITAMIN B-12 PO) Take  by mouth daily.   ??? TRAMADOL HCL (TRAMADOL PO) Take 50 mg by mouth. Take 150mg  BID prn     Medications Discontinued During This Encounter   Medication Reason   ??? chlorpheniramine-HYDROcodone (TUSSIONEX) 8-10 mg/5 mL suspension    ??? cyclobenzaprine (FLEXERIL) 10 mg tablet    ???  fluticasone-salmeterol (ADVAIR) 250-50 mcg/dose diskus inhaler    ??? clonazePAM (KLONOPIN) 0.5 mg tablet Reorder   ??? venlafaxine-SR (EFFEXOR-XR) 150 mg capsule Reorder   ??? amitriptyline (ELAVIL) 25 mg tablet Reorder   ??? primidone (MYSOLINE) 50 mg tablet Reorder   ??? hyoscyamine SL (LEVSIN/SL) 0.125 mg SL tablet Reorder     Allergies   Allergen Reactions   ??? Beta Blocker (Beta-Blockers (Beta-Adrenergic Blocking Agts)) Other (comments)     Fatigue   ??? Tetracycline Unknown (comments)     REVIEW OF SYSTEMS:  CVS:    No Chest pain or Palpitations  Respiratory:  No Shortness of breath or cough  PHYSICAL EXAMINATION:  Constitutional: She is oriented. She appears well-developed and well-nourished.   BP 126/80   Pulse 113   Temp(Src) 99.1 ??F (37.3 ??C) (Oral)   Ht 5' 2.5" (1.588 m)   Wt 174 lb 12 oz (79.266 kg)   BMI 31.45 kg/m2  HENT: ??  Head: Normocephalic and atraumatic.   Nose: Nose normal. ??  Mouth/Throat: Oropharynx is clear and moist. No oropharyngeal exudate.   Eyes: Conjunctivae and extraocular motions are normal. Pupils are equal, round, and reactive to light. Right eye exhibits no discharge. Left eye exhibits no discharge. No scleral icterus.   Neck: Neck supple. No thyromegaly present. She has no cervical adenopathy.    Cardiovascular: Normal rate, regular rhythm and normal heart sounds.?? Exam reveals no gallop and no friction rub.?? ??  No murmur heard.  Extremities no edema.  Pulmonary/Chest: Effort normal and breath sounds normal. No respiratory distress. She has no wheezes. She has no rales.   Abdominal: Soft. Bowel sounds are normal. She exhibits no distension and no mass. No tenderness. She has no rebound and no guarding.     LABORATORY AND IMAGING DATA:  Results for orders placed in visit on 09/20/10   AMB POC RAPID STREP A       Component Value Range    Group A Strep Ag negative         ASSESSMENT AND PLAN:    1. Numbness        Orders Placed This Encounter   ??? gabapentin (NEURONTIN) 100 mg capsule   ??? clonazePAM (KLONOPIN) 0.5 mg tablet   ??? venlafaxine-SR (EFFEXOR-XR) 150 mg capsule   ??? amitriptyline (ELAVIL) 25 mg tablet   ??? primidone (MYSOLINE) 50 mg tablet   ??? hyoscyamine SL (LEVSIN/SL) 0.125 mg SL tablet       continue current, medication changes and labs as above    Offered podiatry referral      Patient was advised to follow with Dr. Lamar Benes or Dr. Luiz Blare on her next visit as planned per the letter sent by [facility] to patients

## 2011-04-11 MED ORDER — HYDROCODONE 10 MG-CHLORPHENIRAMINE 8 MG/5 ML ORAL SUSP EXTEND.REL 12HR
10-8 mg/5 mL | Freq: Every evening | ORAL | Status: DC
Start: 2011-04-11 — End: 2011-07-21

## 2011-04-11 MED ORDER — CEPHALEXIN 500 MG CAP
500 mg | ORAL_CAPSULE | Freq: Three times a day (TID) | ORAL | Status: DC
Start: 2011-04-11 — End: 2011-07-25

## 2011-04-11 NOTE — Progress Notes (Signed)
Chief Complaint   Patient presents with   ??? Fever     since Tues   ??? Sore Throat   ??? Dizziness     HISTORY OF THE PRESENT ILLNESS:  Cassandra Daniels is a 50 y.o. female      Past Medical History   Diagnosis Date   ??? Arthritis    ??? Fibrocystic breast    ??? Mitral (valve) prolapse    ??? Chronic fatigue    ??? Vitamin B 12 deficiency    ??? Paroxysmal atrial tachycardia    ??? HTN    ??? Meningioma NOS    ??? Broken nose 1999     surgery   ??? Echocardiogram 05/05/08     WNL; no valvular disease   ??? Myocardial perfusion 06/21/04     No evidence of scarring or ischemia; EF >80%   ??? Atrial fibrillation    ??? Essential hypertension    ??? MVP (mitral valve prolapse)    ??? SVT (supraventricular tachycardia)      Family History   Problem Relation Age of Onset   ??? Asthma Mother    ??? Breast Cancer Mother    ??? Cancer Mother    ??? Other Father      cabg   ??? Heart Disease Father    ??? High Cholesterol Father    ??? Heart Attack Maternal Grandmother 60     History     Social History   ??? Marital Status: Married     Spouse Name: N/A     Number of Children: N/A   ??? Years of Education: N/A     Occupational History   ??? Not on file.     Social History Main Topics   ??? Smoking status: Never Smoker    ??? Smokeless tobacco: Never Used   ??? Alcohol Use: No   ??? Drug Use: Yes     Special: Prescription, OTC   ??? Sexually Active: Not on file     Other Topics Concern   ??? Not on file     Social History Narrative   ??? No narrative on file     MEDICATIONS:  Current Outpatient Prescriptions   Medication Sig   ??? cephALEXin (KEFLEX) 500 mg capsule Take 1 Cap by mouth three (3) times daily for 7 days.   ??? chlorpheniramine-HYDROcodone (TUSSIONEX) 8-10 mg/5 mL suspension Take 5 mL by mouth every evening.   ??? gabapentin (NEURONTIN) 100 mg capsule Take 1 Cap by mouth two (2) times a day.   ??? clonazePAM (KLONOPIN) 0.5 mg tablet Take 1 Tab by mouth nightly as needed.   ??? venlafaxine-SR (EFFEXOR-XR) 150 mg capsule Take 1 Cap by mouth daily.    ??? amitriptyline (ELAVIL) 25 mg tablet Take 1 Tab by mouth nightly.   ??? primidone (MYSOLINE) 50 mg tablet Take 1 Tab by mouth as directed. TAKE 1 TABLET EVERY MORNING AND 2 TABLETS AT BEDTIME   ??? hyoscyamine SL (LEVSIN/SL) 0.125 mg SL tablet Take 1 Tab by mouth every six (6) hours as needed for Cramping.   ??? zolpidem CR (AMBIEN CR) 12.5 mg tablet TAKE 1 TABLET BY MOUTH AT BEDTIME   ??? Cholecalciferol, Vitamin D3, (VITAMIN D) 1,000 unit Cap Take 1,000 Int'l Units by mouth daily.   ??? HYDROcodone-acetaminophen (VICODIN) 5-500 mg per tablet Take  by mouth as needed.   ??? CYANOCOBALAMIN, VITAMIN B-12, (VITAMIN B-12 PO) Take  by mouth daily.   ??? TRAMADOL HCL (TRAMADOL PO) Take  50 mg by mouth. Take 150mg  BID prn     There are no discontinued medications.  Allergies   Allergen Reactions   ??? Beta Blocker (Beta-Blockers (Beta-Adrenergic Blocking Agts)) Other (comments)     Fatigue   ??? Tetracycline Unknown (comments)     REVIEW OF SYSTEMS:  CVS:    No Chest pain or Palpitations  Respiratory:  No Shortness of breath or cough  PHYSICAL EXAMINATION:  Constitutional: She is oriented. She appears well-developed and well-nourished.   BP 123/81   Pulse 120   Temp(Src) 99.4 ??F (37.4 ??C) (Oral)  HENT: ??  Head: Normocephalic and atraumatic.   Nose: Nose normal. ??  Mouth/Throat: Oropharynx is clear and moist. No oropharyngeal exudate.   Eyes: Conjunctivae and extraocular motions are normal. Pupils are equal, round, and reactive to light. Right eye exhibits no discharge. Left eye exhibits no discharge. No scleral icterus.   Neck: Neck supple. No thyromegaly present. She has no cervical adenopathy.   Cardiovascular: Normal rate, regular rhythm and normal heart sounds.?? Exam reveals no gallop and no friction rub.?? ??  No murmur heard.  Extremities no edema.  Pulmonary/Chest: Effort normal and breath sounds normal. No respiratory distress. She has no wheezes. She has no rales.    Abdominal: Soft. Bowel sounds are normal. She exhibits no distension and no mass. No tenderness. She has no rebound and no guarding.     LABORATORY AND IMAGING DATA:  Results for orders placed in visit on 09/20/10   AMB POC RAPID STREP A       Component Value Range    Group A Strep Ag negative         ASSESSMENT AND PLAN:    1. Sore throat    tachycardia  EKG normal unchanged 2009      Orders Placed This Encounter   ??? cephALEXin (KEFLEX) 500 mg capsule   ??? chlorpheniramine-HYDROcodone (TUSSIONEX) 8-10 mg/5 mL suspension       continue current, medication changes and labs as above  Pt counseled to see Dr. Lamar Benes in 1 week if not better also to discuss hip pain if persists  She promised to also see Dr. Heinz Knuckles      Patient was advised to follow with Dr. Lamar Benes or Dr. Luiz Blare on her next visit as planned per the letter sent by [facility] to patients

## 2011-04-11 NOTE — Progress Notes (Signed)
Chief Complaint   Patient presents with   ??? Fever     since Tues   ??? Sore Throat   ??? Dizziness

## 2011-07-21 MED ORDER — HYDROCODONE 10 MG-CHLORPHENIRAMINE 8 MG/5 ML ORAL SUSP EXTEND.REL 12HR
10-8 mg/5 mL | Freq: Two times a day (BID) | ORAL | Status: DC | PRN
Start: 2011-07-21 — End: 2011-11-03

## 2011-07-21 MED ORDER — AZITHROMYCIN 250 MG TAB
250 mg | ORAL_TABLET | ORAL | Status: DC
Start: 2011-07-21 — End: 2011-07-25

## 2011-07-21 MED ORDER — BENZONATATE 100 MG CAP
100 mg | ORAL_CAPSULE | Freq: Three times a day (TID) | ORAL | Status: AC | PRN
Start: 2011-07-21 — End: 2011-07-28

## 2011-07-21 MED ORDER — AZITHROMYCIN 250 MG TAB
250 mg | ORAL_TABLET | ORAL | Status: DC
Start: 2011-07-21 — End: 2011-07-21

## 2011-07-21 MED ORDER — BENZONATATE 100 MG CAP
100 mg | ORAL_CAPSULE | Freq: Three times a day (TID) | ORAL | Status: DC | PRN
Start: 2011-07-21 — End: 2011-07-21

## 2011-07-21 NOTE — Progress Notes (Signed)
HISTORY OF PRESENT ILLNESS  Cassandra Daniels is a 50 y.o. female.  HPI  Chief Complaint   Patient presents with   ??? Nasal Congestion: patient complains of chest congestion x 4 days. Patient reports low grade feverand has been taking tylenol. The patient denies significant cough at this time and there is no sputum production. However patient feels as though her symptoms are worsening and reports a history of bronchitis that presents with similar symptoms.        Past Medical History   Diagnosis Date   ??? Arthritis    ??? Fibrocystic breast    ??? Mitral (valve) prolapse    ??? Chronic fatigue    ??? Vitamin B 12 deficiency    ??? Paroxysmal atrial tachycardia    ??? HTN    ??? Meningioma NOS    ??? Broken nose 1999     surgery   ??? Echocardiogram 05/05/08     WNL; no valvular disease   ??? Myocardial perfusion 06/21/04     No evidence of scarring or ischemia; EF >80%   ??? Atrial fibrillation    ??? Essential hypertension    ??? MVP (mitral valve prolapse)    ??? SVT (supraventricular tachycardia)      Current Outpatient Prescriptions on File Prior to Visit   Medication Sig Dispense Refill   ??? gabapentin (NEURONTIN) 100 mg capsule Take 1 Cap by mouth two (2) times a day.  180 Cap  3   ??? clonazePAM (KLONOPIN) 0.5 mg tablet Take 1 Tab by mouth nightly as needed.  90 Tab  0   ??? venlafaxine-SR (EFFEXOR-XR) 150 mg capsule Take 1 Cap by mouth daily.  90 Cap  3   ??? amitriptyline (ELAVIL) 25 mg tablet Take 1 Tab by mouth nightly.  90 Tab  1   ??? primidone (MYSOLINE) 50 mg tablet Take 1 Tab by mouth as directed. TAKE 1 TABLET EVERY MORNING AND 2 TABLETS AT BEDTIME  270 Tab  3   ??? hyoscyamine SL (LEVSIN/SL) 0.125 mg SL tablet Take 1 Tab by mouth every six (6) hours as needed for Cramping.  30 Tab  3   ??? zolpidem CR (AMBIEN CR) 12.5 mg tablet TAKE 1 TABLET BY MOUTH AT BEDTIME  90 Tab  0   ??? Cholecalciferol, Vitamin D3, (VITAMIN D) 1,000 unit Cap Take 1,000 Int'l Units by mouth daily.        ??? HYDROcodone-acetaminophen (VICODIN) 5-500 mg per tablet Take  by mouth as needed.       ??? CYANOCOBALAMIN, VITAMIN B-12, (VITAMIN B-12 PO) Take  by mouth daily.       ??? TRAMADOL HCL (TRAMADOL PO) Take 50 mg by mouth. Take 150mg  BID prn         Allergies   Allergen Reactions   ??? Beta Blocker (Beta-Blockers (Beta-Adrenergic Blocking Agts)) Other (comments)     Fatigue   ??? Tetracycline Unknown (comments)       Review of Systems   Constitutional: Positive for fever (low-grade) and malaise/fatigue. Negative for chills.   HENT: Positive for congestion. Negative for ear pain and sore throat.    Eyes: Negative.    Respiratory: Positive for cough. Negative for hemoptysis, sputum production, shortness of breath and wheezing.    Cardiovascular: Negative for chest pain, palpitations, orthopnea, claudication and leg swelling.   Gastrointestinal: Negative for nausea and vomiting.   Musculoskeletal: Negative.    Neurological: Positive for headaches. Negative for dizziness, tingling and weakness.  BP 139/86   Pulse 76   Temp(Src) 97.9 ??F (36.6 ??C) (Oral)    Physical Exam   Nursing note and vitals reviewed.  Constitutional: She is oriented to person, place, and time. She appears well-developed and well-nourished.   HENT:   Head: Normocephalic.   Right Ear: Tympanic membrane is not injected and not erythematous.   Left Ear: Tympanic membrane is not injected and not erythematous.   Nose: Right sinus exhibits maxillary sinus tenderness. Right sinus exhibits no frontal sinus tenderness. Left sinus exhibits maxillary sinus tenderness. Left sinus exhibits no frontal sinus tenderness.   Mouth/Throat: No oropharyngeal exudate.   Eyes: Conjunctivae and EOM are normal. Pupils are equal, round, and reactive to light. Right eye exhibits no discharge. Left eye exhibits no discharge.   Neck: Normal range of motion. Neck supple. No JVD present. No thyromegaly present.    Cardiovascular: Normal rate, regular rhythm and normal heart sounds.  Exam reveals no gallop and no friction rub.    No murmur heard.  Pulmonary/Chest: Effort normal and breath sounds normal. No respiratory distress. She has no wheezes. She has no rales.   Musculoskeletal: Normal range of motion. She exhibits no edema.   Lymphadenopathy:     She has no cervical adenopathy.   Neurological: She is alert and oriented to person, place, and time.   Skin: Skin is warm and dry. No erythema.       ASSESSMENT and PLAN  1. Acute bronchitis : patient educated to use either tessalon or tussionex or alternate use. Patient also educated on the reasoning behind not wanting to over-prescribe antibiotics as a means of preventing resistance.  benzonatate (TESSALON) 100 mg capsule, azithromycin (ZITHROMAX) 250 mg tablet, chlorpheniramine-HYDROcodone (TUSSIONEX) 8-10 mg/5 mL suspension   2. Cough patient educated to use either tessalon or tussionex or alternate use. benzonatate (TESSALON) 100 mg capsule, azithromycin (ZITHROMAX) 250 mg tablet, chlorpheniramine-HYDROcodone (TUSSIONEX) 8-10 mg/5 mL suspension     Follow-up Disposition: PRN

## 2011-07-25 MED ORDER — CEPHALEXIN 500 MG CAP
500 mg | ORAL_CAPSULE | Freq: Three times a day (TID) | ORAL | Status: AC
Start: 2011-07-25 — End: 2011-08-04

## 2011-07-25 MED ORDER — OMEPRAZOLE 40 MG CAP, DELAYED RELEASE
40 mg | ORAL_CAPSULE | Freq: Every day | ORAL | Status: DC
Start: 2011-07-25 — End: 2011-11-03

## 2011-07-25 NOTE — Progress Notes (Signed)
HISTORY OF PRESENT ILLNESS  Cassandra Daniels is a 50 y.o. female.  Chief Complaint   Patient presents with   ??? Other     burning sensation in chest area. Seen Mon for nasal congestion and bronchitis and states she's hasn't gotten any better.   seen last visit few days ago and prescribed z pack and tessalon and tussionex, without significant improvement.  She complains of burning sensation in her chest since Wednesday.  Her blood pressure is mildly elevated today as well.  She also has a low-grade fever today.    She is complaining of off and on appetite.  And not feeling well overall  She is also complaining of a bulge noted in the abdominal area and wants further evaluation of this.  She has no nausea or vomiting bowels.  She has history of prior gastric bypass surgery and it is noted area of one of her incisions.    HPI  Past Medical History   Diagnosis Date   ??? Arthritis    ??? Fibrocystic breast    ??? Mitral (valve) prolapse    ??? Chronic fatigue    ??? Vitamin B 12 deficiency    ??? Paroxysmal atrial tachycardia    ??? HTN    ??? Meningioma nos    ??? Broken nose 1999     surgery   ??? Echocardiogram 05/05/08     WNL; no valvular disease   ??? Myocardial perfusion 06/21/04     No evidence of scarring or ischemia; EF >80%   ??? Atrial fibrillation    ??? Essential hypertension    ??? MVP (mitral valve prolapse)    ??? SVT (supraventricular tachycardia)      Current Outpatient Prescriptions   Medication Sig Dispense Refill   ??? chlorpheniramine-HYDROcodone (TUSSIONEX) 8-10 mg/5 mL suspension Take 5 mL by mouth every twelve (12) hours as needed for Cough.  120 mL  0   ??? benzonatate (TESSALON) 100 mg capsule Take 1 Cap by mouth three (3) times daily as needed for Cough for 7 days.  30 Cap  0   ??? clonazePAM (KLONOPIN) 0.5 mg tablet Take 1 Tab by mouth nightly as needed.  90 Tab  0   ??? venlafaxine-SR (EFFEXOR-XR) 150 mg capsule Take 1 Cap by mouth daily.  90 Cap  3    ??? amitriptyline (ELAVIL) 25 mg tablet Take 1 Tab by mouth nightly.  90 Tab  1   ??? primidone (MYSOLINE) 50 mg tablet Take 1 Tab by mouth as directed. TAKE 1 TABLET EVERY MORNING AND 2 TABLETS AT BEDTIME  270 Tab  3   ??? hyoscyamine SL (LEVSIN/SL) 0.125 mg SL tablet Take 1 Tab by mouth every six (6) hours as needed for Cramping.  30 Tab  3   ??? zolpidem CR (AMBIEN CR) 12.5 mg tablet TAKE 1 TABLET BY MOUTH AT BEDTIME  90 Tab  0   ??? Cholecalciferol, Vitamin D3, (VITAMIN D) 1,000 unit Cap Take 1,000 Int'l Units by mouth daily.       ??? HYDROcodone-acetaminophen (VICODIN) 5-500 mg per tablet Take  by mouth as needed.       ??? CYANOCOBALAMIN, VITAMIN B-12, (VITAMIN B-12 PO) Take  by mouth daily.       ??? TRAMADOL HCL (TRAMADOL PO) Take 50 mg by mouth. Take 150mg  BID prn         Allergies   Allergen Reactions   ??? Beta Blocker (Beta-Blockers (Beta-Adrenergic Blocking Agts)) Other (comments)  Fatigue   ??? Tetracycline Unknown (comments)       Review of Systems   Constitutional: Positive for malaise/fatigue.   Respiratory: Positive for cough.    Gastrointestinal: Negative for nausea and vomiting.     BP 143/96   Pulse 99   Temp(Src) 98.3 ??F (36.8 ??C) (Oral)    Physical Exam   Nursing note and vitals reviewed.  Constitutional: She is oriented to person, place, and time. She appears well-developed and well-nourished. No distress.   Cardiovascular: Normal rate, regular rhythm and normal heart sounds.    Pulmonary/Chest: Effort normal and breath sounds normal. No respiratory distress. She has no wheezes. She has no rales.   Abdominal: Soft. Bowel sounds are normal. She exhibits no distension.   Neurological: She is alert and oriented to person, place, and time.   Skin: No rash noted. No erythema. No pallor.   Psychiatric: She has a normal mood and affect. Her behavior is normal.       ASSESSMENT and PLAN  1. Cough  cephALEXin (KEFLEX) 500 mg capsule As prescribed   2. Acute URI  cephALEXin (KEFLEX) 500 mg capsule    3. Bronchitis  cephALEXin (KEFLEX) 500 mg capsule    recurrent, history     4. Burning chest pain  omeprazole (PRILOSEC) 40 mg capsule   5. Abdominal pain, epigastric  omeprazole (PRILOSEC) 40 mg capsule, CT ABD W WO CONT   6. Abdominal wall bulge  CT ABD W WO CONT   7. History of Roux-en-Y gastric bypass  omeprazole (PRILOSEC) 40 mg capsule, CT ABD W WO CONT

## 2011-07-25 NOTE — Progress Notes (Signed)
Chief Complaint   Patient presents with   ??? Other     burning sensation in chest area. Seen Mon for nasal congestion and bronchitis and states she's hasn't gotten any better.

## 2011-07-25 NOTE — Patient Instructions (Signed)
Gastroesophageal Reflux Disease (GERD): After Your Visit  Your Care Instructions  Gastroesophageal reflux disease (GERD) is the backward flow of stomach acid into the esophagus. The esophagus is the tube that leads from your throat to your stomach. A one-way valve prevents the stomach acid from moving up into this tube. When you have GERD, this valve does not close tightly enough.  If you have mild GERD symptoms including heartburn, you may be able to control the problem with antacids or over-the-counter medicine. Changing your diet, losing weight, and making other lifestyle changes can also help reduce symptoms.  Follow-up care is a key part of your treatment and safety. Be sure to make and go to all appointments, and call your doctor if you are having problems. It???s also a good idea to know your test results and keep a list of the medicines you take.  How can you care for yourself at home?  ?? Take your medicines exactly as prescribed. Call your doctor if you think you are having a problem with your medicine.   ?? Your doctor may recommend over-the-counter medicine. For mild or occasional indigestion, antacids, such as Tums, Gaviscon, Mylanta, or Maalox, may help. Your doctor also may recommend over-the-counter acid reducers, such as Pepcid AC, Tagamet HB, Zantac 75, or Prilosec. Read and follow all instructions on the label. If you use these medicines often, talk with your doctor.   ?? Change your eating habits.   ?? It???s best to eat several small meals instead of two or three large meals.   ?? After you eat, wait 2 to 3 hours before you lie down.   ?? Chocolate, mint, and alcohol can make GERD worse.   ?? Spicy foods, foods that have a lot of acid (like tomatoes and oranges), and coffee can make GERD symptoms worse in some people. If your symptoms are worse after you eat a certain food, you may want to stop eating that food to see if your symptoms get better.    ?? Do not smoke or chew tobacco. Smoking can make GERD worse. If you need help quitting, talk to your doctor about stop-smoking programs and medicines. These can increase your chances of quitting for good.   ?? If you have GERD symptoms at night, raise the head of your bed 6 to 8 inches by putting the frame on blocks or placing a foam wedge under the head of your mattress. (Adding extra pillows does not work.)   ?? Do not wear tight clothing around your middle.   ?? Lose weight if you need to. Losing just 5 to 10 pounds can help.   When should you call for help?  Call your doctor now or seek immediate medical care if:  ?? You have new or different belly pain.   ?? Your stools are black and tarlike or have streaks of blood.   Watch closely for changes in your health, and be sure to contact your doctor if:  ?? Your symptoms have not improved after 2 days.   ?? Food seems to catch in your throat or chest.     Where can you learn more?    Go to http://www.healthwise.net/[facility]   Enter T927 in the search box to learn more about "Gastroesophageal Reflux Disease (GERD): After Your Visit."    ?? 2006-2012 [employer]. Care instructions adapted under license by [facility] (which disclaims liability or warranty for this information). This care instruction is for use with your licensed healthcare professional.   If you have questions about a medical condition or this instruction, always ask your healthcare professional. Healthwise, Incorporated disclaims any warranty or liability for your use of this information.  Content Version: 9.4.94723; Last Revised: October 25, 2009

## 2011-08-15 MED ORDER — ALLEGRA-D 12 HOUR 60 MG-120 MG TABLET,EXTENDED RELEASE
60-120 mg | ORAL_TABLET | ORAL | Status: DC
Start: 2011-08-15 — End: 2011-11-03

## 2011-09-01 MED ORDER — IOVERSOL 320 MG/ML IV SOLN
320 mg iodine/mL | Freq: Once | INTRAVENOUS | Status: AC
Start: 2011-09-01 — End: 2011-09-01
  Administered 2011-09-01: 17:00:00 via INTRAVENOUS

## 2011-09-09 MED ORDER — CYCLOBENZAPRINE 10 MG TAB
10 mg | ORAL_TABLET | ORAL | Status: DC
Start: 2011-09-09 — End: 2011-11-03

## 2011-09-15 NOTE — Telephone Encounter (Signed)
Per Dr. Lupita Raider instructions pt notified CT scan negative for ventral hernia.

## 2011-09-15 NOTE — Telephone Encounter (Signed)
Message copied by Antonieta Pert on Mon Sep 15, 2011  2:29 PM  ------       Message from: Cordelia Poche       Created: Mon Sep 15, 2011  9:32 AM       Contact: [phone]         Pt request ct results be called to her              Up to 2.30pm  [phone]--after 2.30pm  [phone]

## 2011-11-03 LAB — AMB POC RAPID STREP A: Group A Strep Ag: NEGATIVE

## 2011-11-03 MED ORDER — BENZONATATE 100 MG CAP
100 mg | ORAL_CAPSULE | Freq: Three times a day (TID) | ORAL | Status: AC | PRN
Start: 2011-11-03 — End: 2011-11-10

## 2011-11-03 MED ORDER — BENZONATATE 100 MG CAP
100 mg | ORAL_CAPSULE | Freq: Three times a day (TID) | ORAL | Status: DC | PRN
Start: 2011-11-03 — End: 2011-11-03

## 2011-11-03 MED ORDER — AMOXICILLIN CLAVULANATE 875 MG-125 MG TAB
875-125 mg | ORAL_TABLET | Freq: Two times a day (BID) | ORAL | Status: DC
Start: 2011-11-03 — End: 2012-01-01

## 2011-11-03 MED ORDER — HYDROCODONE 10 MG-CHLORPHENIRAMINE 8 MG/5 ML ORAL SUSP EXTEND.REL 12HR
10-8 mg/5 mL | Freq: Two times a day (BID) | ORAL | Status: DC | PRN
Start: 2011-11-03 — End: 2012-01-01

## 2011-11-03 MED ORDER — AMOXICILLIN CLAVULANATE 875 MG-125 MG TAB
875-125 mg | ORAL_TABLET | Freq: Two times a day (BID) | ORAL | Status: DC
Start: 2011-11-03 — End: 2011-11-03

## 2011-11-03 NOTE — Progress Notes (Signed)
HISTORY OF PRESENT ILLNESS  Cassandra Daniels is a 51 y.o. female.  Chief Complaint   Patient presents with   ??? Cough     sx started Fri   ??? Sore Throat   Patient complaints of illness starting last week with stomach virus symptoms. She had a sore throat one half weeks goes well with pink eye and multiple ill contacts. She continues to be sick with numerous issues. She complained of being tired short of breath cough with rattling in the chest she had an old prescription Zithromax which she started taking sure if it made any significant difference    HPI  Past Medical History   Diagnosis Date   ??? Arthritis    ??? Fibrocystic breast    ??? Mitral (valve) prolapse    ??? Chronic fatigue    ??? Vitamin B 12 deficiency    ??? Paroxysmal atrial tachycardia    ??? HTN    ??? Meningioma nos    ??? Broken nose 1999     surgery   ??? Echocardiogram 05/05/08     WNL; no valvular disease   ??? Myocardial perfusion 06/21/04     No evidence of scarring or ischemia; EF >80%   ??? Atrial fibrillation    ??? Essential hypertension    ??? MVP (mitral valve prolapse)    ??? SVT (supraventricular tachycardia)      Current Outpatient Prescriptions   Medication Sig Dispense Refill   ??? chlorpheniramine-HYDROcodone (TUSSIONEX) 8-10 mg/5 mL suspension Take 5 mL by mouth every twelve (12) hours as needed for Cough.  120 mL  0   ??? amoxicillin-clavulanate (AUGMENTIN) 875-125 mg per tablet Take 1 Tab by mouth two (2) times a day.  20 Tab  0   ??? benzonatate (TESSALON PERLES) 100 mg capsule Take 1 Cap by mouth three (3) times daily as needed for Cough for 7 days.  60 Cap  0   ??? clonazePAM (KLONOPIN) 0.5 mg tablet Take 1 Tab by mouth nightly as needed.  90 Tab  0   ??? venlafaxine-SR (EFFEXOR-XR) 150 mg capsule Take 1 Cap by mouth daily.  90 Cap  3   ??? amitriptyline (ELAVIL) 25 mg tablet Take 1 Tab by mouth nightly.  90 Tab  1   ??? primidone (MYSOLINE) 50 mg tablet Take 1 Tab by mouth as directed. TAKE 1 TABLET EVERY MORNING AND 2 TABLETS AT BEDTIME  270 Tab  3   ???  hyoscyamine SL (LEVSIN/SL) 0.125 mg SL tablet Take 1 Tab by mouth every six (6) hours as needed for Cramping.  30 Tab  3   ??? zolpidem CR (AMBIEN CR) 12.5 mg tablet TAKE 1 TABLET BY MOUTH AT BEDTIME  90 Tab  0   ??? Cholecalciferol, Vitamin D3, (VITAMIN D) 1,000 unit Cap Take 1,000 Int'l Units by mouth daily.       ??? HYDROcodone-acetaminophen (VICODIN) 5-500 mg per tablet Take  by mouth as needed.       ??? CYANOCOBALAMIN, VITAMIN B-12, (VITAMIN B-12 PO) Take  by mouth daily.       ??? TRAMADOL HCL (TRAMADOL PO) Take 50 mg by mouth. Take 150mg  BID prn         Allergies   Allergen Reactions   ??? Beta Blocker (Beta-Blockers (Beta-Adrenergic Blocking Agts)) Other (comments)     Fatigue   ??? Tetracycline Unknown (comments)         Review of Systems   Constitutional: Positive for fever, chills and  malaise/fatigue.   HENT: Positive for sore throat.    Respiratory: Positive for cough.      BP 120/84   Pulse 105   Temp(Src) 99.3 ??F (37.4 ??C) (Oral)   Resp 16   Ht 5' 2.5" (1.588 m)   Wt 180 lb (81.647 kg)   BMI 32.40 kg/m2    Physical Exam   Nursing note and vitals reviewed.  Constitutional: She appears well-developed and well-nourished. She appears distressed ( appears moderately ill).   HENT:   Right Ear: External ear normal. Tympanic membrane is not injected.   Left Ear: External ear normal. Tympanic membrane is not injected.   Mouth/Throat: Posterior oropharyngeal erythema present.   Cardiovascular: Normal rate, regular rhythm and normal heart sounds.    Pulmonary/Chest: Effort normal. No respiratory distress. She has no wheezes. She has no rales.   Musculoskeletal: She exhibits no edema.   Lymphadenopathy:     She has cervical adenopathy.     Results for orders placed in visit on 11/03/11   AMB POC RAPID STREP A       Component Value Range    Group A Strep Ag Negative  Negative       ASSESSMENT and PLAN  1. Acute pharyngitis  AMB POC RAPID STREP A, amoxicillin-clavulanate (AUGMENTIN) 875-125 mg per tablet,   amoxicillin-clavulanate (AUGMENTIN) 875-125 mg per tablet   2. Acute bronchitis  chlorpheniramine-HYDROcodone (TUSSIONEX) 8-10 mg/5 mL suspension, amoxicillin-clavulanate (AUGMENTIN) 875-125 mg per tablet, benzonatate (TESSALON PERLES) 100 mg capsule, DISCONTINUED: amoxicillin-clavulanate (AUGMENTIN) 875-125 mg per tablet, : benzonatate (TESSALON PERLES) 100 mg capsule   3. Cough  chlorpheniramine-HYDROcodone (TUSSIONEX) 8-10 mg/5 mL suspension, amoxicillin-clavulanate (AUGMENTIN) 875-125 mg per tablet, benzonatate (TESSALON PERLES) 100 mg capsule, : amoxicillin-clavulanate (AUGMENTIN) 875-125 mg per tablet, : benzonatate (TESSALON PERLES) 100 mg capsule

## 2011-11-03 NOTE — Progress Notes (Signed)
Chief Complaint   Patient presents with   ??? Cough     sx started Fri   ??? Sore Throat

## 2011-11-13 MED ORDER — AMITRIPTYLINE 25 MG TAB
25 mg | ORAL_TABLET | Freq: Every evening | ORAL | Status: DC
Start: 2011-11-13 — End: 2012-01-13

## 2011-12-01 ENCOUNTER — Encounter

## 2011-12-01 NOTE — Progress Notes (Signed)
Referring physician:  Dr. Tonette Lederer, Dr. Rulon Eisenmenger       MedDATA/lcw                       Chief complaint/History of Present Illness:  Chief Complaint   Patient presents with   ??? Follow-up     HPI  Cassandra Daniels 51 y.o. WHITE OR CAUCASIAN female  With a strong family history of breast cancer, who presents today for a follow-up.  She currently denies any palpable masses, nipple discharge or skin changes.      MedDATA/lcw                       Allergies   Allergen Reactions   ??? Beta Blocker (Beta-Blockers (Beta-Adrenergic Blocking Agts)) Other (comments)     Fatigue   ??? Tetracycline Unknown (comments)     Current Outpatient Prescriptions   Medication Sig   ??? amitriptyline (ELAVIL) 25 mg tablet Take 1 Tab by mouth nightly.   ??? chlorpheniramine-HYDROcodone (TUSSIONEX) 8-10 mg/5 mL suspension Take 5 mL by mouth every twelve (12) hours as needed for Cough.   ??? amoxicillin-clavulanate (AUGMENTIN) 875-125 mg per tablet Take 1 Tab by mouth two (2) times a day.   ??? clonazePAM (KLONOPIN) 0.5 mg tablet Take 1 Tab by mouth nightly as needed.   ??? venlafaxine-SR (EFFEXOR-XR) 150 mg capsule Take 1 Cap by mouth daily.   ??? primidone (MYSOLINE) 50 mg tablet Take 1 Tab by mouth as directed. TAKE 1 TABLET EVERY MORNING AND 2 TABLETS AT BEDTIME   ??? hyoscyamine SL (LEVSIN/SL) 0.125 mg SL tablet Take 1 Tab by mouth every six (6) hours as needed for Cramping.   ??? zolpidem CR (AMBIEN CR) 12.5 mg tablet TAKE 1 TABLET BY MOUTH AT BEDTIME   ??? Cholecalciferol, Vitamin D3, (VITAMIN D) 1,000 unit Cap Take 1,000 Int'l Units by mouth daily.   ??? HYDROcodone-acetaminophen (VICODIN) 5-500 mg per tablet Take  by mouth as needed.   ??? CYANOCOBALAMIN, VITAMIN B-12, (VITAMIN B-12 PO) Take  by mouth daily.   ??? TRAMADOL HCL (TRAMADOL PO) Take 50 mg by mouth. Take 150mg  BID prn         Review of systems:  Patient denies any reflux, emesis, abdominal pain, change in bowel habits, hematochezia, melena, fever, weight loss, fatigue chills,  dermatitis, abnormal moles, change in vision, vertigo, epistaxis, dysphagia, hoarseness, chest pain, palpitations, hypertension, edema, cough, shortness of breath, wheezing, hemoptysis, snoring, hematuria, diabetes, thyroid disease, anemia, bruising, history of blood transfusion, dizziness, headache, or fainting.        Past Medical History   Diagnosis Date   ??? Arthritis    ??? Fibrocystic breast    ??? Mitral (valve) prolapse    ??? Chronic fatigue    ??? Vitamin B 12 deficiency    ??? Paroxysmal atrial tachycardia    ??? HTN    ??? Meningioma nos    ??? Broken nose 1999     surgery   ??? Echocardiogram 05/05/08     WNL; no valvular disease   ??? Myocardial perfusion 06/21/04     No evidence of scarring or ischemia; EF >80%   ??? Atrial fibrillation    ??? Essential hypertension    ??? MVP (mitral valve prolapse)    ??? SVT (supraventricular tachycardia)      Past Surgical History   Procedure Date   ??? Hx knee arthroscopy    ??? Hx rhinoplasty    ???  Hx hysterectomy    ??? 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 Hx bladder repair 1996   ??? Ablation of svt 2007     by Dr. Sophronia Simas     History     Social History   ??? Marital Status: MARRIED     Spouse Name: N/A     Number of Children: N/A   ??? Years of Education: N/A     Occupational History   ??? Not on file.     Social History Main Topics   ??? Smoking status: Never Smoker    ??? Smokeless tobacco: Never Used   ??? Alcohol Use: No   ??? Drug Use: Yes     Special: Prescription, OTC   ??? Sexually Active: Not on file     Other Topics Concern   ??? Not on file     Social History Narrative   ??? No narrative on file     Family History   Problem Relation Age of Onset   ??? Asthma Mother    ??? Breast Cancer Mother    ??? Cancer Mother    ??? Other Father      cabg   ??? Heart Disease Father    ??? High Cholesterol Father    ??? Heart Attack Maternal Grandmother 41       Physical Exam:  Well developed well nourished female in no apparent distress  Pulse 64   Resp 18   Ht 5\' 2"  (1.575 m)   Wt 184 lb (83.462 kg)   BMI 33.65 kg/m2   Head: normocephalic,  atraumatic  Mouth: Clear, no overt lesions, oral mucosa pink and moist  Neck: supple, no masses, no adenopathy or carotid bruits, trachea midline  Resp: clear to auscultation bilaterally, no wheeze, rhonchi or rales, excursions normal and symmetrical  Cardio: Regular rate and rhythm, no murmurs, clicks, gallops or rubs, no edema or varicosities  Abdomen: soft, nontender, nondistended, normoactive bowel sounds, no hernias, no hepatosplenomegaly,   Extremeties: warm, well-perfused, no tenderness or swelling, normal gait/station  Neuro: sensation and strength grossly intact and symmetrical  Psych: alert and oriented to person, place and time  Breast exam The right breast and axillary examination are negative for any palpable masses.  The left breast and axillary examination are negative for any palpable masses.      Mammogram of the right breast demonstrates two new areas of calcification.      MedDATA/lcw                     Impression and Plan:  No diagnosis found. The patient will be scheduled to undergo stereotactic biopsy of both of these areas of new calcifications.  We will have her return in the very near future for this procedure.      MedDATA/lcw                   No orders of the defined types were placed in this encounter.

## 2011-12-01 NOTE — Progress Notes (Signed)
mammo

## 2011-12-04 ENCOUNTER — Ambulatory Visit

## 2011-12-04 ENCOUNTER — Encounter

## 2011-12-04 MED ORDER — CLONAZEPAM 0.5 MG TAB
0.5 mg | ORAL_TABLET | Freq: Every evening | ORAL | Status: DC | PRN
Start: 2011-12-04 — End: 2012-01-13

## 2011-12-04 MED ORDER — LIDOCAINE (PF) 10 MG/ML (1 %) IJ SOLN
10 mg/mL (1 %) | Freq: Once | INTRAMUSCULAR | Status: AC
Start: 2011-12-04 — End: 2011-12-04
  Administered 2011-12-04: 05:00:00 via SUBCUTANEOUS

## 2011-12-04 MED ORDER — LIDOCAINE (PF) 10 MG/ML (1 %) IJ SOLN
10 mg/mL (1 %) | Freq: Once | INTRAMUSCULAR | Status: AC
Start: 2011-12-04 — End: 2011-12-04
  Administered 2011-12-04: 18:00:00 via SUBCUTANEOUS

## 2011-12-04 MED ADMIN — lidocaine-EPINEPHrine (XYLOCAINE) 1 %-1:100,000 injection 15 mg: SUBCUTANEOUS | @ 18:00:00 | NDC 00409317801

## 2011-12-04 MED FILL — LIDOCAINE-EPINEPHRINE 1 %-1:100,000 IJ SOLN: 1 %-:00,000 | INTRAMUSCULAR | Qty: 1.5

## 2011-12-04 NOTE — Op Note (Signed)
[facility]                               [address] [place], [place] [address]                                 OPERATIVE REPORT    PATIENT:     Cassandra Daniels, Cassandra Daniels  MRN:            [id number]    DATE:   12/04/2011  BILLING:     [id number]  ROOM:  ATTENDING:   Syliva Overman. CHANG, MD,FACS  DICTATING:   Syliva Overman. CHANG, MD,FACS      PREOPERATIVE DIAGNOSIS: Right breast calcifications x2.    POSTOPERATIVE DIAGNOSIS: Right breast calcifications x2.    PROCEDURES PERFORMED: Vacuum-assisted rotating core biopsy with  stereotactic localization and clip placement, right breast x2.    ESTIMATED BLOOD LOSS:    SPECIMENS REMOVED:    ANESTHESIA: Lidocaine 1% local anesthesia.    SURGEON: Jacquelyne Balint, MD, FACS    DESCRIPTION OF PROCEDURE: The patient was brought into the stereotactic  room, was placed in the prone position. The calcifications in the upper  outer quadrant were first addressed. Once these calcifications were  localized, the right breast was then prepped with Betadine solution. The  area of the needle insertion was then anesthetized with 1% lidocaine  solution. Once this was done, a small skin incision was made with a  scalpel. The needle was advanced into the breast. Prefire films  demonstrated good placement of the needle. The needle was then fired. Six  vacuum-assisted rotating core biopsies were then performed. Specimen  radiograph demonstrated calcifications within the specimen. A stereotactic  marker clip was then placed. Hemostasis was achieved using direct pressure.  The wound was then reapproximated using Steri-Strips. Once this was done,  the patient was then repositioned to localize the 6 o'clock calcifications.  Similarly, this area was then washed with Betadine solution. The area of  the needle insertion was then anesthetized with 1% lidocaine solution. Once  this was done, a small skin incision was made with a scalpel. The  needle  was advanced into the breast. Prefire films demonstrated good placement of  the needle. The needle was then fired. Six vacuum-assisted rotating core  biopsies were then performed. Specimen radiograph demonstrated  calcifications within the specimen. A stereotactic marker clip was then  placed. Hemostasis was achieved. The wound was then reapproximated using  Steri-Strips.    ASSESSMENT AND PLAN: We will call the patient once pathology is available.                                  Xiomar Crompton Y. CHANG, MD,FACS    EYC:wmx  D: 12/04/2011 T: 12/04/2011  8:37 P  Job: [id number]  CScriptDoc #: [id number]    cc:     Syliva Overman. CHANG, MD,FACS

## 2011-12-04 NOTE — Telephone Encounter (Signed)
Faxed to pharmacy [phone]

## 2011-12-04 NOTE — Op Note (Signed)
[facility]                               [address]                          [place], [place] [address]                                 OPERATIVE REPORT    PATIENT:     Cassandra Daniels, Cassandra Daniels  MRN:            [id number]    DATE:   12/04/2011  BILLING:     [id number]  ROOM:  ATTENDING:   Shondale Quinley Y. Jeneal Vogl, MD,FACS  DICTATING:   Bryony Kaman Y. Pryor Guettler, MD,FACS      PREOPERATIVE DIAGNOSIS: Right breast calcifications x2.    POSTOPERATIVE DIAGNOSIS: Right breast calcifications x2.    PROCEDURES PERFORMED: Vacuum-assisted rotating core biopsy with  stereotactic localization and clip placement, right breast x2.    ESTIMATED BLOOD LOSS:    SPECIMENS REMOVED:    ANESTHESIA: Lidocaine 1% local anesthesia.    SURGEON: Quianna Avery, MD, FACS    DESCRIPTION OF PROCEDURE: The patient was brought into the stereotactic  room, was placed in the prone position. The calcifications in the upper  outer quadrant were first addressed. Once these calcifications were  localized, the right breast was then prepped with Betadine solution. The  area of the needle insertion was then anesthetized with 1% lidocaine  solution. Once this was done, a small skin incision was made with a  scalpel. The needle was advanced into the breast. Prefire films  demonstrated good placement of the needle. The needle was then fired. Six  vacuum-assisted rotating core biopsies were then performed. Specimen  radiograph demonstrated calcifications within the specimen. A stereotactic  marker clip was then placed. Hemostasis was achieved using direct pressure.  The wound was then reapproximated using Steri-Strips. Once this was done,  the patient was then repositioned to localize the 6 o'clock calcifications.  Similarly, this area was then washed with Betadine solution. The area of  the needle insertion was then anesthetized with 1% lidocaine solution. Once  this was done, a small skin incision was made with a scalpel. The  needle  was advanced into the breast. Prefire films demonstrated good placement of  the needle. The needle was then fired. Six vacuum-assisted rotating core  biopsies were then performed. Specimen radiograph demonstrated  calcifications within the specimen. A stereotactic marker clip was then  placed. Hemostasis was achieved. The wound was then reapproximated using  Steri-Strips.    ASSESSMENT AND PLAN: We will call the patient once pathology is available.                                  Lenyx Boody Y. Harlee Eckroth, MD,FACS    EYC:wmx  D: 12/04/2011 T: 12/04/2011  8:37 P  Job: [id number]  CScriptDoc #: [id number]    cc:     Krishon Adkison Y. Zahira Brummond, MD,FACS

## 2011-12-04 NOTE — Telephone Encounter (Signed)
I do not see any followup visits scheduled.  Patient needs to have a non-sick visit appointment.  But I did refill today.

## 2011-12-17 NOTE — Telephone Encounter (Signed)
Pt called. Path results given. To f.u Rt mammo in 4 months. Given to L.A. To schedule [facility]

## 2012-01-01 LAB — AMB POC RAPID STREP A: Group A Strep Ag: NEGATIVE

## 2012-01-01 MED ORDER — BENZONATATE 100 MG CAP
100 mg | ORAL_CAPSULE | Freq: Three times a day (TID) | ORAL | Status: AC | PRN
Start: 2012-01-01 — End: 2012-01-08

## 2012-01-01 MED ORDER — HYDROCODONE 10 MG-CHLORPHENIRAMINE 8 MG/5 ML ORAL SUSP EXTEND.REL 12HR
10-8 mg/5 mL | Freq: Every evening | ORAL | Status: DC | PRN
Start: 2012-01-01 — End: 2012-01-28

## 2012-01-01 MED ORDER — AMOXICILLIN CLAVULANATE 875 MG-125 MG TAB
875-125 mg | ORAL_TABLET | Freq: Two times a day (BID) | ORAL | Status: AC
Start: 2012-01-01 — End: 2012-01-11

## 2012-01-01 NOTE — Progress Notes (Signed)
Chief Complaint   Patient presents with   ??? Sore Throat     sx started Mon.   ??? Cough     sx started yesterday

## 2012-01-01 NOTE — Patient Instructions (Signed)
Sore Throat: After Your Visit  Your Care Instructions  Infection by bacteria or a virus causes most sore throats. Cigarette smoke, dry air, air pollution, allergies, and yelling can also cause a sore throat. Sore throats can be painful and annoying. Fortunately, most sore throats go away on their own. If you have a bacterial infection, your doctor may prescribe antibiotics.  Follow-up care is a key part of your treatment and safety. Be sure to make and go to all appointments, and call your doctor if you are having problems. It???s also a good idea to know your test results and keep a list of the medicines you take.  How can you care for yourself at home?  ?? If your doctor prescribed antibiotics, take them as directed. Do not stop taking them just because you feel better. You need to take the full course of antibiotics.   ?? Gargle with warm salt water once an hour to help reduce swelling and relieve discomfort. Use 1 teaspoon of salt mixed in 1 cup of warm water.   ?? Take an over-the-counter pain medicine, such as acetaminophen (Tylenol), ibuprofen (Advil, Motrin), or naproxen (Aleve). Read and follow all instructions on the label.   ?? Be careful when taking over-the-counter cold or flu medicines and Tylenol at the same time. Many of these medicines have acetaminophen, which is Tylenol. Read the labels to make sure that you are not taking more than the recommended dose. Too much acetaminophen (Tylenol) can be harmful.   ?? Drink plenty of fluids. Fluids may help soothe an irritated throat. Hot fluids, such as tea or soup, may help decrease throat pain.   ?? Use over-the-counter throat lozenges to soothe pain. Regular cough drops or hard candy may also help. These should not be given to young children because of the risk of choking.   ?? Do not smoke or allow others to smoke around you. If you need help quitting, talk to your doctor about stop-smoking programs and medicines. These can increase your chances of quitting  for good.   ?? Use a vaporizer or humidifier to add moisture to your bedroom. Follow the directions for cleaning the machine.   When should you call for help?  Call your doctor now or seek immediate medical care if:  ?? You have new or worse trouble swallowing.   ?? Your sore throat gets much worse on one side.   Watch closely for changes in your health, and be sure to contact your doctor if:  ?? You are not getting better after 3 to 5 days.     Where can you learn more?    Go to http://www.healthwise.net/[facility]   Enter U420 in the search box to learn more about "Sore Throat: After Your Visit."    ?? 2006-2013 Healthwise, Inc[employer]. Care instructions adapted under license by [employer] (which disclaims liability or warranty for this information). This care instruction is for use with your licensed healthcare professional. If you have questions about a medical condition or this instruction, always ask your healthcare professional. Healthwise, Incorporated disclaims any warranty or liability for your use of this information.  Content Version: 9.6.101520; Last Revised: November 13, 2009

## 2012-01-02 NOTE — Progress Notes (Signed)
HISTORY OF PRESENT ILLNESS  Cassandra Daniels is a 51 y.o. female.  HPI  Chief Complaint   Patient presents with   ??? Sore Throat     sx started Mon.   ??? Cough     sx started yesterday   Patient presents today with complaints of low grade fever, sore throat, cough x 8 days. The patient states that when she gets these symptoms, it usually turns into bronchitis. The patient states that she has been taking tylenol for her symptoms but they have not resolved as of yet.  Past Medical History   Diagnosis Date   ??? Arthritis    ??? Fibrocystic breast    ??? Mitral (valve) prolapse    ??? Chronic fatigue    ??? Vitamin B 12 deficiency    ??? Paroxysmal atrial tachycardia    ??? HTN    ??? Meningioma nos    ??? Broken nose 1999     surgery   ??? Echocardiogram 05/05/08     WNL; no valvular disease   ??? Myocardial perfusion 06/21/04     No evidence of scarring or ischemia; EF >80%   ??? Atrial fibrillation    ??? Essential hypertension    ??? MVP (mitral valve prolapse)    ??? SVT (supraventricular tachycardia)      Current Outpatient Prescriptions on File Prior to Visit   Medication Sig Dispense Refill   ??? clonazePAM (KLONOPIN) 0.5 mg tablet Take 1 Tab by mouth nightly as needed.  90 Tab  0   ??? amitriptyline (ELAVIL) 25 mg tablet Take 1 Tab by mouth nightly.  90 Tab  1   ??? venlafaxine-SR (EFFEXOR-XR) 150 mg capsule Take 1 Cap by mouth daily.  90 Cap  3   ??? primidone (MYSOLINE) 50 mg tablet Take 1 Tab by mouth as directed. TAKE 1 TABLET EVERY MORNING AND 2 TABLETS AT BEDTIME  270 Tab  3   ??? hyoscyamine SL (LEVSIN/SL) 0.125 mg SL tablet Take 1 Tab by mouth every six (6) hours as needed for Cramping.  30 Tab  3   ??? zolpidem CR (AMBIEN CR) 12.5 mg tablet TAKE 1 TABLET BY MOUTH AT BEDTIME  90 Tab  0   ??? Cholecalciferol, Vitamin D3, (VITAMIN D) 1,000 unit Cap Take 1,000 Int'l Units by mouth daily.       ??? HYDROcodone-acetaminophen (VICODIN) 5-500 mg per tablet Take  by mouth as needed.       ??? CYANOCOBALAMIN, VITAMIN B-12, (VITAMIN B-12 PO) Take  by  mouth daily.       ??? TRAMADOL HCL (TRAMADOL PO) Take 50 mg by mouth. Take 150mg  BID prn         Allergies   Allergen Reactions   ??? Beta Blocker (Beta-Blockers (Beta-Adrenergic Blocking Agts)) Other (comments)     Fatigue   ??? Tetracycline Unknown (comments)     Review of Systems   Constitutional: Positive for fever and chills. Negative for malaise/fatigue.   HENT: Positive for congestion and sore throat. Negative for ear pain.    Respiratory: Positive for cough. Negative for sputum production, shortness of breath and wheezing.    Cardiovascular: Negative.    Neurological: Positive for headaches.     BP 122/81   Pulse 117   Temp(Src) 99 ??F (37.2 ??C) (Oral)   Resp 16   Ht 5\' 2"  (1.575 m)   Wt 179 lb (81.194 kg)   BMI 32.74 kg/m2    Physical Exam   Nursing  note and vitals reviewed.  Constitutional: She appears well-developed and well-nourished.   HENT:   Right Ear: Tympanic membrane is not injected and not erythematous.   Left Ear: Tympanic membrane is not injected and not erythematous.   Nose: Right sinus exhibits no maxillary sinus tenderness and no frontal sinus tenderness. Left sinus exhibits no maxillary sinus tenderness and no frontal sinus tenderness.   Mouth/Throat: Mucous membranes are normal. Posterior oropharyngeal erythema present. No oropharyngeal exudate or posterior oropharyngeal edema.   Neck: Normal range of motion. Neck supple. No JVD present. No thyromegaly present.   Cardiovascular: Normal rate, regular rhythm and normal heart sounds.  Exam reveals no gallop and no friction rub.    No murmur heard.  Pulmonary/Chest: Effort normal and breath sounds normal. No respiratory distress.   Lymphadenopathy:     She has cervical adenopathy.     Results for orders placed in visit on 01/01/12   AMB POC RAPID STREP A       Component Value Range    Group A Strep Ag Negative  Negative       ASSESSMENT and PLAN  1. Acute pharyngitis : acute, strept negative amoxicillin-clavulanate (AUGMENTIN) 875-125 mg per tablet    2. Upper respiratory infection :acute,medication prescribed with pt education given amoxicillin-clavulanate (AUGMENTIN) 875-125 mg per tablet   3. Sore throat :acute AMB POC RAPID STREP A, amoxicillin-clavulanate (AUGMENTIN) 875-125 mg per tablet   4. Cough :acute, medication prescribed with pt education given. Pt voiced understanding of safe use of medication. F/u prn amoxicillin-clavulanate (AUGMENTIN) 875-125 mg per tablet, benzonatate (TESSALON) 100 mg capsule, chlorpheniramine-HYDROcodone (TUSSIONEX) 8-10 mg/5 mL suspension     Follow-up Disposition:  Return if symptoms worsen or fail to improve.

## 2012-01-08 ENCOUNTER — Encounter

## 2012-01-08 MED ORDER — MOXIFLOXACIN 400 MG TAB
400 mg | ORAL_TABLET | Freq: Every day | ORAL | Status: AC
Start: 2012-01-08 — End: 2012-01-13

## 2012-01-08 NOTE — Progress Notes (Signed)
Patient called stating that medication prescribed is not effective and symptoms are persistent. avelox 400mg  po x 10days sent to pharmacy

## 2012-01-13 MED ORDER — ZOLPIDEM SR 12.5 MG MULTIPHASE TAB
12.5 mg | ORAL_TABLET | ORAL | Status: DC
Start: 2012-01-13 — End: 2012-04-26

## 2012-01-13 MED ORDER — VENLAFAXINE SR 150 MG 24 HR CAP
150 mg | ORAL_CAPSULE | Freq: Every day | ORAL | Status: DC
Start: 2012-01-13 — End: 2013-11-03

## 2012-01-13 MED ORDER — CLONAZEPAM 0.5 MG TAB
0.5 mg | ORAL_TABLET | Freq: Every evening | ORAL | Status: DC | PRN
Start: 2012-01-13 — End: 2012-04-26

## 2012-01-13 MED ORDER — HYDROCODONE 10 MG-CHLORPHENIRAMINE 8 MG/5 ML ORAL SUSP EXTEND.REL 12HR
10-8 mg/5 mL | Freq: Two times a day (BID) | ORAL | Status: DC | PRN
Start: 2012-01-13 — End: 2012-01-28

## 2012-01-13 MED ORDER — PRIMIDONE 50 MG TAB
50 mg | ORAL_TABLET | ORAL | Status: DC
Start: 2012-01-13 — End: 2012-04-13

## 2012-01-13 MED ORDER — METHYLPREDNISOLONE 4 MG TABS IN A DOSE PACK
4 mg | PACK | ORAL | Status: DC
Start: 2012-01-13 — End: 2012-01-28

## 2012-01-13 MED ORDER — METHYLPREDNISOLONE 4 MG TABS IN A DOSE PACK
4 mg | PACK | ORAL | Status: DC
Start: 2012-01-13 — End: 2012-01-13

## 2012-01-13 MED ORDER — AMITRIPTYLINE 25 MG TAB
25 mg | ORAL_TABLET | Freq: Every evening | ORAL | Status: DC
Start: 2012-01-13 — End: 2012-04-26

## 2012-01-13 NOTE — Progress Notes (Signed)
Chief Complaint   Patient presents with   ??? Complete Physical     no pap     Note:  Patient denies feeling depressed.

## 2012-01-15 ENCOUNTER — Encounter

## 2012-01-15 NOTE — Progress Notes (Signed)
Subjective:   51 y.o. female for annual medical exam.   Her gyne and breast care is done elsewhere by her Ob-Gyne physician.    Current Outpatient Prescriptions   Medication Sig Dispense Refill   ??? Mv,Ca,Min-FA-Herbal No.157 (ESTROVEN MAXIMUM STRENGTH) 400 mcg Tab Take  by mouth.       ??? primidone (MYSOLINE) 50 mg tablet TAKE 1 TABLET EVERY MORNING AND 2 TABLETS AT BEDTIME  270 Tab  0   ??? zolpidem CR (AMBIEN CR) 12.5 mg tablet Take 1/2 tab at bedtime as needed for sleep  45 Tab  0   ??? venlafaxine-SR (EFFEXOR-XR) 150 mg capsule Take 1 Cap by mouth daily.  90 Cap  3   ??? amitriptyline (ELAVIL) 25 mg tablet Take 1 Tab by mouth nightly.  90 Tab  1   ??? clonazePAM (KLONOPIN) 0.5 mg tablet Take 1 Tab by mouth nightly as needed.  90 Tab  0   ??? chlorpheniramine-HYDROcodone (TUSSIONEX PENNKINETIC ER) 8-10 mg/5 mL suspension Take 5 mL by mouth every twelve (12) hours as needed for Cough.  120 mL  0   ??? methylPREDNISolone (MEDROL, PAK,) 4 mg tablet Per dose pack instructions  1 Package  0   ??? exenatide (BYETTA) 5 mcg/0.02 mL injection 5 mcg by SubCUTAneous route two (2) times daily (with meals).       ??? chlorpheniramine-HYDROcodone (TUSSIONEX) 8-10 mg/5 mL suspension Take 5 mL by mouth nightly as needed for Cough.  120 mL  0   ??? hyoscyamine SL (LEVSIN/SL) 0.125 mg SL tablet Take 1 Tab by mouth every six (6) hours as needed for Cramping.  30 Tab  3   ??? Cholecalciferol, Vitamin D3, (VITAMIN D) 1,000 unit Cap Take 1,000 Int'l Units by mouth daily.       ??? HYDROcodone-acetaminophen (VICODIN) 5-500 mg per tablet Take  by mouth as needed.       ??? CYANOCOBALAMIN, VITAMIN B-12, (VITAMIN B-12 PO) Take  by mouth daily.       ??? TRAMADOL HCL (TRAMADOL PO) Take 50 mg by mouth. Take 150mg  BID prn         Allergies   Allergen Reactions   ??? Beta Blocker (Beta-Blockers (Beta-Adrenergic Blocking Agts)) Other (comments)     Fatigue   ??? Tetracycline Unknown (comments)     Past Medical History   Diagnosis Date   ??? Arthritis    ??? Fibrocystic breast     ??? Mitral (valve) prolapse    ??? Chronic fatigue    ??? Vitamin B 12 deficiency    ??? Paroxysmal atrial tachycardia    ??? HTN    ??? Meningioma nos    ??? Broken nose 1999     surgery   ??? Echocardiogram 05/05/08     WNL; no valvular disease   ??? Myocardial perfusion 06/21/04     No evidence of scarring or ischemia; EF >80%   ??? Atrial fibrillation    ??? Essential hypertension    ??? MVP (mitral valve prolapse)    ??? SVT (supraventricular tachycardia)      Past Surgical History   Procedure Date   ??? Hx knee arthroscopy    ??? Hx rhinoplasty    ??? Hx hysterectomy    ??? 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 Hx bladder repair 1996   ??? Ablation of svt 2007     by Dr. Sophronia Simas     Family History   Problem Relation Age of Onset   ??? Asthma  Mother    ??? Breast Cancer Mother    ??? Cancer Mother    ??? Other Father      cabg   ??? Heart Disease Father    ??? High Cholesterol Father    ??? Heart Attack Maternal Grandmother 50     History   Substance Use Topics   ??? Smoking status: Never Smoker    ??? Smokeless tobacco: Never Used   ??? Alcohol Use: No             ROS: Feeling generally well. No TIA's or unusual headaches, no dysphagia.  No prolonged cough. No dyspnea or chest pain on exertion.  No abdominal pain, change in bowel habits, black or bloody stools.  No urinary tract symptoms.  No new or unusual musculoskeletal symptoms.    Specific concerns today: see additional note  .    Objective:   The patient appears well, alert, oriented x 3, in no distress.  BP 122/81   Pulse 102   Temp(Src) 99.1 ??F (37.3 ??C) (Oral)   Resp 16   Ht 5\' 2"  (1.575 m)   Wt 181 lb 8 oz (82.328 kg)   BMI 33.20 kg/m2  ENT normal.  Neck supple. No adenopathy or thyromegaly. PERLA. Lungs are clear, good air entry, no wheezes, rhonchi or rales. S1 and S2 normal, no murmurs, regular rate and rhythm. Abdomen soft without tenderness, guarding, mass or organomegaly. Extremities show no edema, normal peripheral pulses. Neurological is normal, no focal findings.  Breast and Pelvic exams are deferred.     Assessment/Plan:   Well Woman    1. Well woman exam without gynecological exam     Need to obtain colonoscopy report from [facility]  Continue GYN care with mammogram per Dr. Mickle Asper

## 2012-01-15 NOTE — Progress Notes (Signed)
HISTORY OF PRESENT ILLNESS  Cassandra Daniels is a 51 y.o. female.patient was scheduled to come in for an annual physical but also had numerous other problems issues and complaints.  This is the first contact with her for follow up of chronic issues since Dr. Dionicio Stall departure.  She has been seen for multiple acute care visits.  Most recently she was seen for recurrence of bronchitis and complaints of still being sick.  She has been treated with Augmentin and Avelox and Tussionex and started coughing again yesterday.  She is frustrated because she still feels like she is ill and has been needing to take the Tussionex twice a day rather than daily and has run out of her prescription and needs a refill.  She also wants followup for history of cerebral meningioma.  6 chronic problem.  She usually has MRI scans done in July and would like to have this ordered.  She has no new symptoms related to this.  She has seen Dr. Layla Barter in the past.  Followup chronic tremors which apparently has been thought to be benign.  She has seen neurology who is in agreement with her being maintained on antiseizure medication primidone.  This helped significantly.  And she needs a refill today.  Followup chronic headaches for which she takes Elavil and is helpful  Followup chronic insomnia for which she takes Ambien CR 12.5, but only takes one half pill which is helpful  Complaints of back and neck muscle spasm and pain for which she needs Flexeril.  Followup history of paroxysmal atrial tachycardia.  This is a chronic problem that is followed by cardiology.  She has not had a cardiology followup in quite some time.  She complains of increased palpitations recently.  Followup anxiety.  She is chronic and stable    HPI  Past Medical History   Diagnosis Date   ??? Arthritis    ??? Fibrocystic breast    ??? Mitral (valve) prolapse    ??? Chronic fatigue    ??? Vitamin B 12 deficiency    ??? Paroxysmal atrial tachycardia    ??? HTN    ??? Meningioma  nos    ??? Broken nose 1999     surgery   ??? Echocardiogram 05/05/08     WNL; no valvular disease   ??? Myocardial perfusion 06/21/04     No evidence of scarring or ischemia; EF >80%   ??? Atrial fibrillation    ??? Essential hypertension    ??? MVP (mitral valve prolapse)    ??? SVT (supraventricular tachycardia)      Current Outpatient Prescriptions on File Prior to Visit   Medication Sig Dispense Refill   ??? exenatide (BYETTA) 5 mcg/0.02 mL injection 5 mcg by SubCUTAneous route two (2) times daily (with meals).       ??? chlorpheniramine-HYDROcodone (TUSSIONEX) 8-10 mg/5 mL suspension Take 5 mL by mouth nightly as needed for Cough.  120 mL  0   ??? hyoscyamine SL (LEVSIN/SL) 0.125 mg SL tablet Take 1 Tab by mouth every six (6) hours as needed for Cramping.  30 Tab  3   ??? Cholecalciferol, Vitamin D3, (VITAMIN D) 1,000 unit Cap Take 1,000 Int'l Units by mouth daily.       ??? HYDROcodone-acetaminophen (VICODIN) 5-500 mg per tablet Take  by mouth as needed.       ??? CYANOCOBALAMIN, VITAMIN B-12, (VITAMIN B-12 PO) Take  by mouth daily.       ??? TRAMADOL HCL (TRAMADOL  PO) Take 50 mg by mouth. Take 150mg  BID prn           Allergies   Allergen Reactions   ??? Beta Blocker (Beta-Blockers (Beta-Adrenergic Blocking Agts)) Other (comments)     Fatigue   ??? Tetracycline Unknown (comments)       Review of Systems   HENT: Positive for neck pain.    Respiratory: Negative for shortness of breath.    Cardiovascular: Positive for palpitations.   Musculoskeletal: Positive for myalgias, back pain and joint pain.   Neurological: Positive for headaches.     BP 122/81   Pulse 102   Temp(Src) 99.1 ??F (37.3 ??C) (Oral)   Resp 16   Ht 5\' 2"  (1.575 m)   Wt 181 lb 8 oz (82.328 kg)   BMI 33.20 kg/m2    Physical Exam   Nursing note and vitals reviewed.  Constitutional: She is oriented to person, place, and time. She appears well-developed and well-nourished.   HENT:   Mouth/Throat: Oropharynx is clear and moist.   Neck: Normal range of motion. Neck supple. No JVD  present. No thyromegaly present.   Cardiovascular: Normal rate, regular rhythm and normal heart sounds.  Exam reveals no gallop and no friction rub.    Pulmonary/Chest: Effort normal and breath sounds normal. No respiratory distress. She has no wheezes. She has no rales.   Abdominal: Soft. Bowel sounds are normal. She exhibits no distension. There is no tenderness. There is no rebound and no guarding.   Musculoskeletal: She exhibits no edema and no tenderness.   Lymphadenopathy:     She has no cervical adenopathy.   Neurological: She is alert and oriented to person, place, and time. Coordination normal.   Skin: No rash noted. No erythema. No pallor.   Psychiatric: She has a normal mood and affect.       ASSESSMENT and PLAN  1.     2. Acute bronchitis  chlorpheniramine-HYDROcodone (TUSSIONEX PENNKINETIC ER) 8-10 mg/5 mL suspension, methylPREDNISolone (MEDROL, PAK,) 4 mg tablet, DISCONTINUED: methylPREDNISolone (MEDROL, PAK,) 4 mg tablet   3. Tremor  primidone (MYSOLINE) 50 mg tablet   4. PAT (paroxysmal atrial tachycardia)     5. Insomnia  zolpidem CR (AMBIEN CR) 12.5 mg tablet   6. Anxiety  venlafaxine-SR (EFFEXOR-XR) 150 mg capsule, clonazePAM (KLONOPIN) 0.5 mg tablet   7. Chronic Headaches  amitriptyline (ELAVIL) 25 mg tablet   8. Cerebral meningioma  MRI BRAIN W WO CONT   This is a complicated patient with numerous medical problems.  There are multiple other issues, which will be evaluated on future visits as well.  Follow-up Disposition:  Return in about 3 months (around 04/14/2012).

## 2012-01-28 MED ORDER — TRIMETHOPRIM-POLYMYXIN B 0.1 %-10,000 UNIT/ML EYE DROPS
10000 unit- 1 mg/mL | OPHTHALMIC | Status: AC
Start: 2012-01-28 — End: 2012-02-04

## 2012-01-28 NOTE — Progress Notes (Signed)
Cassandra Daniels 51 y.o. female   Chief Complaint   Patient presents with   ??? Pink Eye     burning,itching, slight redness x1 day, and two grandchildren have it currently.

## 2012-01-28 NOTE — Patient Instructions (Signed)
Pinkeye: After Your Visit  Your Care Instructions  Pinkeye is redness and swelling of the eye surface and the conjunctiva (the lining of the eyelid and the covering of the white part of the eye). Pinkeye is also called conjunctivitis. Pinkeye is often caused by infection with bacteria or a virus. Dry air, allergies, smoke, and chemicals are other common causes.  Pinkeye often clears on its own in 7 to 10 days. Antibiotics only help if the pinkeye is caused by bacteria. Pinkeye caused by infection spreads easily. If an allergy or chemical is causing pinkeye, it will not go away unless you can avoid whatever is causing it.  Follow-up care is a key part of your treatment and safety. Be sure to make and go to all appointments, and call your doctor if you are having problems. It???s also a good idea to know your test results and keep a list of the medicines you take.  How can you care for yourself at home?  ?? Wash your hands often. Always wash them before and after you treat pinkeye or touch your eyes or face.   ?? Use moist cotton or a clean, wet cloth to remove crust. Wipe from the inside corner of the eye to the outside. Use a clean part of the cloth for each wipe.   ?? Put cold or warm wet cloths on your eye a few times a day if the eye hurts.   ?? Do not wear contact lenses or eye makeup until the pinkeye is gone. Throw away any eye makeup you were using when you got pinkeye. Clean your contacts and storage case. If you wear disposable contacts, use a new pair when your eye has cleared and it is safe to wear contacts again.   ?? If the doctor gave you antibiotic ointment or eyedrops, use them as directed. Use the medicine for as long as instructed, even if your eye starts looking better soon. Keep the bottle tip clean, and do not let it touch the eye area.   ?? To put in eyedrops or ointment:   ?? Tilt your head back, and pull your lower eyelid down with one finger.   ?? Drop or squirt the medicine inside the lower lid.    ?? Close your eye for 30 to 60 seconds to let the drops or ointment move around.   ?? Do not touch the ointment or dropper tip to your eyelashes or any other surface.   ?? Do not share towels, pillows, or washcloths while you have pinkeye.   When should you call for help?  Call your doctor now or seek immediate medical care if:  ?? You have pain in your eye, not just irritation on the surface.   ?? You have a change in vision or loss of vision.   ?? You have an increase in discharge from the eye.   ?? Your eye has not started to improve or begins to get worse within 48 hours after you start using antibiotics.   ?? Pinkeye lasts longer than 7 days.   Watch closely for changes in your health, and be sure to contact your doctor if you have any problems.    Where can you learn more?    Go to http://www.healthwise.net/[facility]   Enter Y392 in the search box to learn more about "Pinkeye: After Your Visit."    ?? 2006-2013 [employer]. Care instructions adapted under license by [facility] (which disclaims liability or warranty for this   information). This care instruction is for use with your licensed healthcare professional. If you have questions about a medical condition or this instruction, always ask your healthcare professional. Health[employer], Inc[employer] disclaims any warranty or liability for your use of this information.  Content Version: 9.7.130178; Last Revised: October 24, 2010

## 2012-01-30 NOTE — Progress Notes (Signed)
HISTORY OF PRESENT ILLNESS  Cassandra Daniels is a 51 y.o. female.  HPI  Chief Complaint   Patient presents with   ??? Pink Eye     burning,itching, slight redness x1 day, and two grandchildren have it currently.pt presents today with complaints of red slightly itching slightly painful eyes x 1 day. Pt has been keeping her grandchildren whom both have bacterial conjunctivitis. She believes this is what she now has as well. She has not been taking anything OTC for symptoms     Past Medical History   Diagnosis Date   ??? Arthritis    ??? Fibrocystic breast    ??? Mitral (valve) prolapse    ??? Chronic fatigue    ??? Vitamin B 12 deficiency    ??? Paroxysmal atrial tachycardia    ??? HTN    ??? Meningioma nos    ??? Broken nose 1999     surgery   ??? Echocardiogram 05/05/08     WNL; no valvular disease   ??? Myocardial perfusion 06/21/04     No evidence of scarring or ischemia; EF >80%   ??? Atrial fibrillation    ??? Essential hypertension    ??? MVP (mitral valve prolapse)    ??? SVT (supraventricular tachycardia)      Current Outpatient Prescriptions on File Prior to Visit   Medication Sig Dispense Refill   ??? primidone (MYSOLINE) 50 mg tablet TAKE 1 TABLET EVERY MORNING AND 2 TABLETS AT BEDTIME  270 Tab  0   ??? zolpidem CR (AMBIEN CR) 12.5 mg tablet Take 1/2 tab at bedtime as needed for sleep  45 Tab  0   ??? venlafaxine-SR (EFFEXOR-XR) 150 mg capsule Take 1 Cap by mouth daily.  90 Cap  3   ??? amitriptyline (ELAVIL) 25 mg tablet Take 1 Tab by mouth nightly.  90 Tab  1   ??? clonazePAM (KLONOPIN) 0.5 mg tablet Take 1 Tab by mouth nightly as needed.  90 Tab  0   ??? exenatide (BYETTA) 5 mcg/0.02 mL injection 5 mcg by SubCUTAneous route two (2) times daily (with meals).       ??? hyoscyamine SL (LEVSIN/SL) 0.125 mg SL tablet Take 1 Tab by mouth every six (6) hours as needed for Cramping.  30 Tab  3   ??? Cholecalciferol, Vitamin D3, (VITAMIN D) 1,000 unit Cap Take 1,000 Int'l Units by mouth daily.       ??? HYDROcodone-acetaminophen (VICODIN) 5-500 mg per  tablet Take  by mouth as needed.       ??? CYANOCOBALAMIN, VITAMIN B-12, (VITAMIN B-12 PO) Take  by mouth daily.       ??? TRAMADOL HCL (TRAMADOL PO) Take 50 mg by mouth. Take 150mg  BID prn       ??? Mv,Ca,Min-FA-Herbal No.157 (ESTROVEN MAXIMUM STRENGTH) 400 mcg Tab Take  by mouth.         Allergies   Allergen Reactions   ??? Beta Blocker (Beta-Blockers (Beta-Adrenergic Blocking Agts)) Other (comments)     Fatigue   ??? Tetracycline Unknown (comments)       Review of Systems   Constitutional: Negative.  Negative for fever.   HENT: Negative.    Eyes: Positive for pain, discharge and redness. Negative for blurred vision, double vision and photophobia.   Respiratory: Negative.    Cardiovascular: Negative.    Gastrointestinal: Negative.      BP 117/79   Pulse 98   Temp(Src) 97.2 ??F (36.2 ??C) (Oral)   Resp 16  Ht 5\' 2"  (1.575 m)   Wt 180 lb (81.647 kg)   BMI 32.92 kg/m2    Physical Exam   Nursing note and vitals reviewed.  Constitutional: She appears well-developed and well-nourished.   Eyes: EOM are normal. Pupils are equal, round, and reactive to light. Right eye exhibits discharge. Left eye exhibits discharge. Right conjunctiva is injected. Right conjunctiva has a hemorrhage (small hemorrhage to upper portion of conjunctive under eyelid noted when patient looks down). Left conjunctiva is injected.   Neck: Normal range of motion. Neck supple. No JVD present. No thyromegaly present.   Cardiovascular: Normal rate, regular rhythm and normal heart sounds.  Exam reveals no gallop and no friction rub.    No murmur heard.  Pulmonary/Chest: Effort normal and breath sounds normal. No respiratory distress. She has no wheezes.   Lymphadenopathy:     She has no cervical adenopathy.       ASSESSMENT and PLAN  1. Bacterial conjunctivitis :acute, medication prescribed with pt to f/u prn trimethoprim-polymyxin b (POLYTRIM) ophthalmic solution     Follow-up Disposition:  Return if symptoms worsen or fail to improve.

## 2012-02-03 MED ORDER — GADOPENTETATE DIMEGLUMINE 10 MMOL/20 ML (469.01 MG/ML) IV
10 mmol/20 mL (469.01 mg/mL) | Freq: Once | INTRAVENOUS | Status: AC
Start: 2012-02-03 — End: 2012-02-03
  Administered 2012-02-03: 12:00:00 via INTRAVENOUS

## 2012-02-03 MED FILL — MAGNEVIST 10 MMOL/20 ML (469.01 MG/ML) INTRAVENOUS SOLUTION: 10 mmol/20 mL (469.01 mg/mL) | INTRAVENOUS | Qty: 17

## 2012-02-05 NOTE — Progress Notes (Signed)
History of Present Illness:   This is a 51 year-old female here for followup.  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  She felt like her heart stopped for a second.  She did not lose consciousness.  She has had two other episodes like this in the past five or six years.  She had an electrophysiology study in 2007 without any inducible arrhythmias.  She also has a history of PVCs.  She has chronic knee and arm pain from degenerative joint disease and takes multiple pain medications including Tramadol and Vicodin intermittently.  She also has prescriptions for Ambien, Klonopin and Amitriptyline.  This seems to be a fairly isolated event over the past year.  She denies chest pain, dyspnea, PND, orthopnea or edema.      Impression:   1.  History of recurrent palpitations with occasional PVCs along with previous electrophysiology study in October 2007 at [facility] without inducible arrhythmias.    2.  History of labile blood pressure.   3.  History of mitral valve prolapse with echocardiogram in 2011 without any regurgitation.   4.  Chronic pain on multiple medications.      Recommendations:  Her symptoms sound most consistent with suspected PVCs with perhaps a vasovagal trigger.  I recommended she stay hydrated and try to minimize pain medications, which are potentially vasodilating agents.  We did briefly discuss potentially implantable loop recorder or repeat event monitor, however, in this situation, I think this is quite low yield.  If her symptoms continue to recur, she will let me know and I will see her back to discuss further testing.  All questions were answered.        Past Medical History   Diagnosis Date   ??? Arthritis    ??? Fibrocystic breast    ??? Mitral (valve) prolapse    ??? Chronic fatigue    ??? Vitamin B 12 deficiency    ??? Paroxysmal atrial tachycardia    ??? HTN    ??? Meningioma nos    ??? Broken nose 1999     surgery   ??? History of echocardiogram 05/24/2010     EF  60-65%.  Increased wall thickness.  Normal diastolic function.  RVSP 25-30 mmHg.     ??? Myocardial perfusion 06/21/2004     No evidence of scarring or ischemia.  EF >80%.    ??? Atrial fibrillation    ??? Essential hypertension    ??? MVP (mitral valve prolapse)    ??? SVT (supraventricular tachycardia)        Current Outpatient Prescriptions   Medication Sig Dispense Refill   ??? traMADol (ULTRAM) 50 mg tablet Take 150 mg by mouth daily.       ??? Mv,Ca,Min-FA-Herbal No.157 (ESTROVEN MAXIMUM STRENGTH) 400 mcg Tab Take 1 Tab by mouth daily.       ??? primidone (MYSOLINE) 50 mg tablet TAKE 1 TABLET EVERY MORNING AND 2 TABLETS AT BEDTIME  270 Tab  0   ??? zolpidem CR (AMBIEN CR) 12.5 mg tablet Take 1/2 tab at bedtime as needed for sleep  45 Tab  0   ??? venlafaxine-SR (EFFEXOR-XR) 150 mg capsule Take 1 Cap by mouth daily.  90 Cap  3   ??? amitriptyline (ELAVIL) 25 mg tablet Take 1 Tab by mouth nightly.  90 Tab  1   ??? clonazePAM (KLONOPIN) 0.5 mg tablet Take 1 Tab by mouth nightly as needed.  90 Tab  0   ??? hyoscyamine SL (LEVSIN/SL) 0.125 mg SL tablet Take 1 Tab by mouth every six (6) hours as needed for Cramping.  30 Tab  3   ??? Cholecalciferol, Vitamin D3, (VITAMIN D) 1,000 unit Cap Take 1,000 Int'l Units by mouth two (2) times a day.       ??? HYDROcodone-acetaminophen (VICODIN) 5-500 mg per tablet Take 1 Tab by mouth as needed.       ??? CYANOCOBALAMIN, VITAMIN B-12, (VITAMIN B-12 PO) Take 1 Tab by mouth every seven (7) days.       ??? trimethoprim-polymyxin b (POLYTRIM) ophthalmic solution Administer 1 Drop to both eyes every four (4) hours for 7 days.  10 mL  1   ??? exenatide (BYETTA) 5 mcg/0.02 mL injection 5 mcg by SubCUTAneous route two (2) times daily (with meals).           Social History   reports that she has never smoked. She has never used smokeless tobacco.   reports that she does not drink alcohol.    Family History  family history includes Asthma in her mother; Breast Cancer in her mother; Cancer in her mother; Heart Attack (age  of onset:58) in her maternal grandmother; Heart Disease in her father; High Cholesterol in her father; and Other in her father.    Review of Systems  Except as stated above include:  Constitutional: Negative for fever, chills and malaise/fatigue.   HEENT: Negative.   Gastrointestinal: Negative.  Musculoskeletal: Negative.  Neurological:  No localized symptoms.    PHYSICAL EXAM  BP Readings from Last 3 Encounters:   02/05/12 132/70   01/28/12 117/79   01/13/12 122/81     Pulse Readings from Last 3 Encounters:   02/05/12 102   01/28/12 98   01/13/12 102     Wt Readings from Last 3 Encounters:   02/05/12 82.555 kg (182 lb)   02/03/12 79.379 kg (175 lb)   01/28/12 81.647 kg (180 lb)     General:   Well developed, well groomed.    Head/Neck:   No jugular venous distention     No carotid bruits.    No evidence of xanthelasma.  Lungs:   No respiratory distress.      Clear bilaterally.  Heart:   Regular rate and rhythm.  Normal S1/S2.      Palpation of heart with normal point of maximum impulse.    No significant murmurs, rubs or gallops.  Abdomen:   Soft and nontender.      No palpable abdominal mass or bruits.  Extremities:   Intact peripheral pulses.      No significant edema.    Neurological:   Alert and oriented to person, place, time.      No focal neurological deficit visually.

## 2012-02-13 LAB — AMB POC RAPID STREP A: Group A Strep Ag: NEGATIVE

## 2012-02-13 MED ORDER — FEXOFENADINE 180 MG TAB
180 mg | ORAL_TABLET | Freq: Every day | ORAL | Status: DC
Start: 2012-02-13 — End: 2012-04-26

## 2012-02-13 MED ORDER — LIDOCAINE 2 % MUCOSAL SOLN
2 % | Status: DC | PRN
Start: 2012-02-13 — End: 2012-04-26

## 2012-02-13 MED ORDER — FLUCONAZOLE 150 MG TAB
150 mg | ORAL_TABLET | Freq: Every day | ORAL | Status: AC
Start: 2012-02-13 — End: 2012-02-14

## 2012-02-13 MED ORDER — PENICILLIN V-K 500 MG TAB
500 mg | ORAL_TABLET | Freq: Four times a day (QID) | ORAL | Status: AC
Start: 2012-02-13 — End: 2012-02-23

## 2012-02-13 NOTE — Progress Notes (Signed)
Chief Complaint   Patient presents with   ??? Sore Throat     Started 5 days ago   ??? Vaginal Itching     Patient states she think she has a yeast infection.  She started taking pcn Wed night for her sore throat.

## 2012-02-17 LAB — AMB POC SMEAR, STAIN & INTERPRET, WET MOUNT

## 2012-02-17 NOTE — Progress Notes (Signed)
HISTORY OF PRESENT ILLNESS  Cassandra Daniels is a 51 y.o. female.  HPI  Chief Complaint   Patient presents with   ??? Sore Throat: pt presents today with complaints of sore throat x 5 days.states that she gets recurrent sore throats and she thought it was strept so she started taking PCN once per day that she receives from her dentist to treat as prophylaxis prior to dental work. Denies fever but reports chills, fatigue. Has not been taking anything other than the antibiotic     Started 5 days ago   ??? Vaginal Itching     Patient states she think she has a yeast infection.  She started taking pcn Wed night for her sore throat. Pt states that she started takign PCN therapy and now has symptoms of a yeast infection which she gets a lot as well. X 2 days symptoms, vaginal itching. Denies dysuria, hematuria. States that she knows she has a yeast infection and that she tends to get them with antibiotic therapy     Past Medical History   Diagnosis Date   ??? Arthritis    ??? Fibrocystic breast    ??? Mitral (valve) prolapse    ??? Chronic fatigue    ??? Vitamin B 12 deficiency    ??? Paroxysmal atrial tachycardia    ??? HTN    ??? Meningioma nos    ??? Broken nose 1999     surgery   ??? History of echocardiogram 05/24/2010     EF 60-65%.  Increased wall thickness.  Normal diastolic function.  RVSP 25-30 mmHg.     ??? Myocardial perfusion 06/21/2004     No evidence of scarring or ischemia.  EF >80%.    ??? Atrial fibrillation    ??? Essential hypertension    ??? MVP (mitral valve prolapse)    ??? SVT (supraventricular tachycardia)      Current Outpatient Prescriptions on File Prior to Visit   Medication Sig Dispense Refill   ??? traMADol (ULTRAM) 50 mg tablet Take 150 mg by mouth daily.       ??? Mv,Ca,Min-FA-Herbal No.157 (ESTROVEN MAXIMUM STRENGTH) 400 mcg Tab Take 1 Tab by mouth daily.       ??? primidone (MYSOLINE) 50 mg tablet TAKE 1 TABLET EVERY MORNING AND 2 TABLETS AT BEDTIME  270 Tab  0   ??? zolpidem CR (AMBIEN CR) 12.5 mg tablet Take 1/2 tab at  bedtime as needed for sleep  45 Tab  0   ??? venlafaxine-SR (EFFEXOR-XR) 150 mg capsule Take 1 Cap by mouth daily.  90 Cap  3   ??? amitriptyline (ELAVIL) 25 mg tablet Take 1 Tab by mouth nightly.  90 Tab  1   ??? clonazePAM (KLONOPIN) 0.5 mg tablet Take 1 Tab by mouth nightly as needed.  90 Tab  0   ??? exenatide (BYETTA) 5 mcg/0.02 mL injection 5 mcg by SubCUTAneous route two (2) times daily (with meals).       ??? hyoscyamine SL (LEVSIN/SL) 0.125 mg SL tablet Take 1 Tab by mouth every six (6) hours as needed for Cramping.  30 Tab  3   ??? Cholecalciferol, Vitamin D3, (VITAMIN D) 1,000 unit Cap Take 1,000 Int'l Units by mouth two (2) times a day.       ??? HYDROcodone-acetaminophen (VICODIN) 5-500 mg per tablet Take 1 Tab by mouth as needed.       ??? CYANOCOBALAMIN, VITAMIN B-12, (VITAMIN B-12 PO) Take 1 Tab by mouth every seven (  7) days.         Allergies   Allergen Reactions   ??? Beta Blocker (Beta-Blockers (Beta-Adrenergic Blocking Agts)) Other (comments)     Fatigue   ??? Tetracycline Unknown (comments)       Review of Systems   Constitutional: Positive for chills and malaise/fatigue. Negative for fever.   HENT: Positive for sore throat. Negative for ear pain and congestion.    Respiratory: Negative.  Negative for cough and sputum production.    Cardiovascular: Negative.    Gastrointestinal: Negative for nausea, vomiting and abdominal pain.   Genitourinary: Negative.  Negative for dysuria, urgency, frequency, hematuria and flank pain.   Skin: Positive for itching (vaginal). Negative for rash.   Neurological: Negative for headaches.     BP 133/84   Pulse 88   Temp(Src) 98.1 ??F (36.7 ??C) (Oral)   Resp 16   Ht 5\' 2"  (1.575 m)   Wt 177 lb 8 oz (80.513 kg)   BMI 32.46 kg/m2    Physical Exam   Nursing note and vitals reviewed.  Constitutional: She appears well-developed and well-nourished. No distress.   HENT:   Right Ear: Tympanic membrane is not injected and not erythematous.   Left Ear: Tympanic membrane is not injected and not  erythematous.   Nose: Right sinus exhibits no maxillary sinus tenderness and no frontal sinus tenderness. Left sinus exhibits no maxillary sinus tenderness and no frontal sinus tenderness.   Mouth/Throat: Mucous membranes are normal. Posterior oropharyngeal erythema present. No oropharyngeal exudate.   Neck: Normal range of motion. Neck supple. No JVD present. No thyromegaly present.   Cardiovascular: Normal rate, regular rhythm and normal heart sounds.  Exam reveals no gallop and no friction rub.    No murmur heard.  Pulmonary/Chest: Effort normal and breath sounds normal. No respiratory distress. She has no wheezes.   Genitourinary: There is erythema around the vagina. Vaginal discharge found.   Lymphadenopathy:     She has cervical adenopathy.        Right: No inguinal adenopathy present.        Left: No inguinal adenopathy present.     Results for orders placed in visit on 02/13/12   AMB POC RAPID STREP A       Component Value Range    VALID INTERNAL CONTROL POC Yes      Group A Strep Ag Negative  Negative   AMB POC SMEAR, STAIN & INTERPRET, WET MOUNT       Component Value Range    Wet mount yeast     '  ASSESSMENT and PLAN  1. Sore throat, recurrent : acute, pt referred to ENT for evaluation . Treated empirically. Medication prescribed with education given AMB POC RAPID STREP A, lidocaine (XYLOCAINE) 2 % solution, fexofenadine (ALLEGRA) 180 mg tablet, REFERRAL TO ENT-OTOLARYNGOLOGY, penicillin v potassium (VEETID) 500 mg tablet, AMB POC SMEAR, STAIN & INTERPRET, WET MOUNT   2. Vaginal yeast infection : acute, uncontrolled. Medication prescribed with pt education given. Pt advised to use monistat therapy OTC or diflucan therapy fluconazole (DIFLUCAN) 150 mg tablet   3. Vaginal itching : acute, uncontrolled      Follow-up Disposition: Not on File PRN

## 2012-03-19 NOTE — Telephone Encounter (Signed)
Pt called to states she is having palps more often now and would like to put on the monitor that Dr Doristine Johns said she could get if she had any more. Dr Matilde Sprang aware in absence of Dr Doristine Johns    Verbal order per Dr. Freddi Che   [facility] to order 24 holter monitor for palps

## 2012-03-24 NOTE — Progress Notes (Signed)
24 hr holter monitor connected

## 2012-04-07 NOTE — Progress Notes (Signed)
Quick Note:    holter done for palpitations..Dorlis Judice, MA    ______

## 2012-04-15 NOTE — Telephone Encounter (Signed)
Faxed to [facility]

## 2012-04-26 NOTE — Progress Notes (Signed)
HISTORY OF PRESENT ILLNESS  Cassandra Daniels is a 51 y.o. female.  Chief Complaint   Patient presents with   ??? Allergic Rhinitis   ??? Insomnia   ??? Anxiety   ??? Vitamin D Deficiency   ??? Vitamin B12 Deficiency     follow up cerebral meningoima, review mri  HPI  Past Medical History   Diagnosis Date   ??? Arthritis    ??? Fibrocystic breast    ??? Mitral (valve) prolapse    ??? Chronic fatigue    ??? Vitamin B 12 deficiency    ??? Paroxysmal atrial tachycardia    ??? HTN    ??? Meningioma nos    ??? Broken nose 1999     surgery   ??? History of echocardiogram 05/24/2010     EF 60-65%.  Increased wall thickness.  Normal diastolic function.  RVSP 25-30 mmHg.     ??? Myocardial perfusion 06/21/2004     No evidence of scarring or ischemia.  EF >80%.    ??? Atrial fibrillation    ??? Essential hypertension    ??? MVP (mitral valve prolapse)    ??? SVT (supraventricular tachycardia)      Current Outpatient Prescriptions   Medication Sig Dispense Refill   ??? estrogens, conjugated,-methylTESTOSTERone (ESTRATEST HS) 0.625-1.25 mg per tablet Take 1 Tab by mouth daily.       ??? primidone (MYSOLINE) 50 mg tablet TAKE 1 TABLET BY MOUTH EVERY MORNING AND 2 TABLETS BY MOUTH EVERY NIGHT AT BEDTIME  270 Tab  0   ??? traMADol (ULTRAM) 50 mg tablet Take 150 mg by mouth daily.       ??? zolpidem CR (AMBIEN CR) 12.5 mg tablet Take 1/2 tab at bedtime as needed for sleep  45 Tab  0   ??? venlafaxine-SR (EFFEXOR-XR) 150 mg capsule Take 1 Cap by mouth daily.  90 Cap  3   ??? clonazePAM (KLONOPIN) 0.5 mg tablet Take 1 Tab by mouth nightly as needed.  90 Tab  0   ??? exenatide (BYETTA) 5 mcg/0.02 mL injection 5 mcg by SubCUTAneous route two (2) times daily (with meals).       ??? hyoscyamine SL (LEVSIN/SL) 0.125 mg SL tablet Take 1 Tab by mouth every six (6) hours as needed for Cramping.  30 Tab  3   ??? Cholecalciferol, Vitamin D3, (VITAMIN D) 1,000 unit Cap Take 1,000 Int'l Units by mouth two (2) times a day.       ??? HYDROcodone-acetaminophen (VICODIN) 5-500 mg per tablet Take 1 Tab  by mouth as needed.       ??? CYANOCOBALAMIN, VITAMIN B-12, (VITAMIN B-12 PO) Take 1 Tab by mouth every seven (7) days.         Allergies   Allergen Reactions   ??? Beta Blocker (Beta-Blockers (Beta-Adrenergic Blocking Agts)) Other (comments)     Fatigue   ??? Tetracycline Unknown (comments)       ROS  BP 142/100   Pulse 95   Temp 99.1 ??F (37.3 ??C) (Oral)   Resp 16   Ht 5\' 2"  (1.575 m)   Wt 174 lb 4 oz (79.039 kg)   BMI 31.87 kg/m2    Physical Exam   Nursing note and vitals reviewed.  Constitutional: She is oriented to person, place, and time. She appears well-developed and well-nourished.   HENT:   Mouth/Throat: Oropharynx is clear and moist.   Cardiovascular: Normal rate, regular rhythm and normal heart sounds.    Pulmonary/Chest: Effort normal  and breath sounds normal.   Abdominal: Soft. Bowel sounds are normal. She exhibits no distension. There is no tenderness. There is no rebound.   Lymphadenopathy:     She has no cervical adenopathy.   Neurological: She is alert and oriented to person, place, and time. Coordination normal.     MR Brain With And Without Contrast   CPT CODE: 16109   HISTORY: Followup, Cerebral meningioma 225.2.   COMPARISON: 02/14/11 brain MRI   TECHNIQUE: Brain scanned with axial and sagittal T1W scans, axial T2W scans,   axial FLAIR, axial diffusion weighted images, and post gadolinium axial and   coronal T1W scans.   CONTRAST: 17 cc Magnevist IV.   FINDINGS:   Diffusion sequence negative. No cytotoxic edema. No acute ischemia. Grossly   patent flow signal in the major arteries at the base of the brain. No abnormal   extra-axial fluid collections. No acute hemorrhage. CSF spaces are normal size.   No hydrocephalus.   Along the right lateral frontotemporal convexity again noted is the homogeneous   and avidly enhancing extra-axial dural based mass consistent with meningioma.   Measures 13 mm AP, 6 mm transverse, and 14 mm SI. Compared to the   contrast-enhanced MRI from 10/25/09 similar measurements  (previously 13 x 5 x 12   mm respectively). Some contact with the right lateral lobe cortex, but no   reactive signal changes in the cortex.   No additional parenchymal or dural based masses. Small focus of ill-defined   increased T2 FLAIR signal in the left periatrial white matter in the parietal   lobe is unchanged from previous exams. Maybe minimal chronic small vessel   ischemic change.   Sinuses are clear. Mastoids are well aerated. Orbits and globes are grossly   normal. CP angles and IACs are grossly normal.   IMPRESSION:   Small extra-axial dural based mass right lateral frontotemporal convexity   consistent with meningioma is stable/trace larger than on the exam from March   2011. No new masses.   Brain otherwise unremarkable for patient age.    ASSESSMENT and PLAN  1. Insomnia  LIPID PANEL, METABOLIC PANEL, COMPREHENSIVE, VITAMIN D, 25 HYDROXY, VITAMIN B12, CBC WITH AUTOMATED DIFF, T4, FREE, TSH, 3RD GENERATION, FERRITIN, IRON, VITAMIN B1, PLASMA, MAGNESIUM, DISCONTINUED: zolpidem CR (AMBIEN CR) 12.5 mg tablet   2. PAT (paroxysmal atrial tachycardia)  LIPID PANEL, METABOLIC PANEL, COMPREHENSIVE, VITAMIN D, 25 HYDROXY, VITAMIN B12, CBC WITH AUTOMATED DIFF, T4, FREE, TSH, 3RD GENERATION, FERRITIN, IRON, VITAMIN B1, PLASMA, MAGNESIUM   3. Palpitations  LIPID PANEL, METABOLIC PANEL, COMPREHENSIVE, VITAMIN D, 25 HYDROXY, VITAMIN B12, CBC WITH AUTOMATED DIFF, T4, FREE, TSH, 3RD GENERATION, FERRITIN, IRON, VITAMIN B1, PLASMA, MAGNESIUM   4. Elevated blood pressure  LIPID PANEL, METABOLIC PANEL, COMPREHENSIVE, VITAMIN D, 25 HYDROXY, VITAMIN B12, CBC WITH AUTOMATED DIFF, T4, FREE, TSH, 3RD GENERATION, FERRITIN, IRON, VITAMIN B1, PLASMA, MAGNESIUM   5. Tremor, benign, essential  primidone (MYSOLINE) 50 mg tablet, LIPID PANEL, METABOLIC PANEL, COMPREHENSIVE, VITAMIN D, 25 HYDROXY, VITAMIN B12, CBC WITH AUTOMATED DIFF, T4, FREE, TSH, 3RD GENERATION, FERRITIN, IRON, VITAMIN B1, PLASMA, MAGNESIUM   6. Cerebral  meningioma  LIPID PANEL, METABOLIC PANEL, COMPREHENSIVE, VITAMIN D, 25 HYDROXY, VITAMIN B12, CBC WITH AUTOMATED DIFF, T4, FREE, TSH, 3RD GENERATION, FERRITIN, IRON, VITAMIN B1, PLASMA, MAGNESIUM   7. History of Roux-en-Y gastric bypass  LIPID PANEL, METABOLIC PANEL, COMPREHENSIVE, VITAMIN D, 25 HYDROXY, VITAMIN B12, CBC WITH AUTOMATED DIFF, T4, FREE, TSH, 3RD GENERATION, FERRITIN, IRON,  VITAMIN B1, PLASMA, MAGNESIUM   8. Anxiety  clonazePAM (KLONOPIN) 0.5 mg tablet, LIPID PANEL, METABOLIC PANEL, COMPREHENSIVE, VITAMIN D, 25 HYDROXY, VITAMIN B12, CBC WITH AUTOMATED DIFF, T4, FREE, TSH, 3RD GENERATION, FERRITIN, IRON, VITAMIN B1, PLASMA, MAGNESIUM   .Marland KitchenVisit based of time 35 minutes total,  with more than 50% of the face-to-face visit time spent in counseling on mri result, multiple issues above, anxiety, it's treatment, prognosis, management advise, plan and follow-up recommendations.

## 2012-04-26 NOTE — Progress Notes (Signed)
Chief Complaint   Patient presents with   ??? Allergic Rhinitis   ??? Insomnia   ??? Anxiety   ??? Vitamin D Deficiency   ??? Vitamin B12 Deficiency

## 2012-04-29 LAB — AMB POC RAPID STREP A: Group A Strep Ag: NEGATIVE

## 2012-04-29 NOTE — Progress Notes (Signed)
HISTORY OF PRESENT ILLNESS  Cassandra Daniels is a 51 y.o. female.  HPI  Chief Complaint   Patient presents with   ??? Sore Throat     x 1 day, mnor fever, head ache   ??? Cough     x 1 day, mucus accumulates in the throat   pt reports today for sore throat and cough(non-productive) x 1 day. Pt states that she feels sick, reports low grade fever of 99. She states that she has to keep clearing her throat. Pt states that she was seen by ENT when referred last time back in June/July for recurrent sore throat. She states that nothing was found at that visit except that there was some esophageal swelling and was advised to take prilosec. Pt states that throat is sore so she has taken tylenol and lidocaine oral. She states that she does not take her allegra daily as she was told that she did not have to take it daily by another provider in the past so she has not been doing so, only taking it PRN. Pt really wants an antibiotic because she states that she has to babysit her grandchildren and that she does not want to be sick over the weekend.  Past Medical History   Diagnosis Date   ??? Arthritis    ??? Fibrocystic breast    ??? Mitral (valve) prolapse    ??? Chronic fatigue    ??? Vitamin B 12 deficiency    ??? Paroxysmal atrial tachycardia    ??? HTN    ??? Meningioma nos    ??? Broken nose 1999     surgery   ??? History of echocardiogram 05/24/2010     EF 60-65%.  Increased wall thickness.  Normal diastolic function.  RVSP 25-30 mmHg.     ??? Myocardial perfusion 06/21/2004     No evidence of scarring or ischemia.  EF >80%.    ??? Atrial fibrillation    ??? Essential hypertension    ??? MVP (mitral valve prolapse)    ??? SVT (supraventricular tachycardia)      Current Outpatient Prescriptions on File Prior to Visit   Medication Sig Dispense Refill   ??? estrogens, conjugated,-methylTESTOSTERone (ESTRATEST HS) 0.625-1.25 mg per tablet Take 1 Tab by mouth daily.       ??? primidone (MYSOLINE) 50 mg tablet TAKE 1 TABLET BY MOUTH EVERY MORNING AND 2  TABLETS BY MOUTH EVERY NIGHT AT BEDTIME  270 Tab  3   ??? zolpidem CR (AMBIEN CR) 12.5 mg tablet Take 1/2 tab at bedtime as needed for sleep  45 Tab  0   ??? clonazePAM (KLONOPIN) 0.5 mg tablet Take 1 Tab by mouth nightly as needed.  90 Tab  0   ??? traMADol (ULTRAM) 50 mg tablet Take 150 mg by mouth daily.       ??? venlafaxine-SR (EFFEXOR-XR) 150 mg capsule Take 1 Cap by mouth daily.  90 Cap  3   ??? exenatide (BYETTA) 5 mcg/0.02 mL injection 5 mcg by SubCUTAneous route two (2) times daily (with meals).       ??? hyoscyamine SL (LEVSIN/SL) 0.125 mg SL tablet Take 1 Tab by mouth every six (6) hours as needed for Cramping.  30 Tab  3   ??? Cholecalciferol, Vitamin D3, (VITAMIN D) 1,000 unit Cap Take 1,000 Int'l Units by mouth two (2) times a day.       ??? HYDROcodone-acetaminophen (VICODIN) 5-500 mg per tablet Take 1 Tab by mouth as needed.       ???  CYANOCOBALAMIN, VITAMIN B-12, (VITAMIN B-12 PO) Take 1 Tab by mouth every seven (7) days.         Allergies   Allergen Reactions   ??? Beta Blocker (Beta-Blockers (Beta-Adrenergic Blocking Agts)) Other (comments)     Fatigue   ??? Tetracycline Unknown (comments)       Review of Systems   Constitutional: Positive for malaise/fatigue. Negative for fever and chills.   HENT: Positive for congestion and sore throat. Negative for ear pain and ear discharge.    Respiratory: Negative.  Negative for shortness of breath.    Cardiovascular: Negative.  Negative for chest pain.   Neurological: Negative for headaches.     BP 127/81   Pulse 103   Temp 98 ??F (36.7 ??C) (Oral)   Resp 16   Ht 5\' 2"  (1.575 m)   Wt 174 lb (78.926 kg)   BMI 31.83 kg/m2    Physical Exam   Nursing note and vitals reviewed.  Constitutional: She appears well-developed and well-nourished. No distress.   HENT:   Right Ear: Tympanic membrane and external ear normal.   Left Ear: Tympanic membrane and external ear normal.   Nose: Right sinus exhibits frontal sinus tenderness. Right sinus exhibits no maxillary sinus tenderness. Left sinus  exhibits frontal sinus tenderness. Left sinus exhibits no maxillary sinus tenderness.   Mouth/Throat: Oropharynx is clear and moist and mucous membranes are normal. No oropharyngeal exudate, posterior oropharyngeal edema or posterior oropharyngeal erythema.   Neck: Normal range of motion. Neck supple.   Cardiovascular: Normal rate, regular rhythm and normal heart sounds.  Exam reveals no gallop and no friction rub.    No murmur heard.  Pulmonary/Chest: Effort normal and breath sounds normal. No respiratory distress. She has no wheezes. She has no rhonchi. She has no rales.   Lymphadenopathy:     She has cervical adenopathy.     Results for orders placed in visit on 04/29/12   AMB POC RAPID STREP A       Component Value Range    VALID INTERNAL CONTROL POC Yes      Group A Strep Ag Negative  Negative       ASSESSMENT and PLAN  1. Sore throat : acute, uncontrolled. Negative strept. Medication prescribed for symptoms management. Pt advised to f/u if symptoms worsen AMB POC RAPID STREP A, sodium chloride (SALINE MIST) 0.65 % nasal spray, fexofenadine (ALLEGRA) 180 mg tablet   2. Cough:acute, uncontrolled. Medication prescribed with pt education given  chlorpheniramine-HYDROcodone (TUSSIONEX) 8-10 mg/5 mL suspension, benzonatate (TESSALON) 100 mg capsule, sodium chloride (SALINE MIST) 0.65 % nasal spray, fexofenadine (ALLEGRA) 180 mg tablet   3. Post-nasal drip :acute,uncontrolled. Pt advised to take allegra daily instead of when needed or not at all as she has been doing.  chlorpheniramine-HYDROcodone (TUSSIONEX) 8-10 mg/5 mL suspension, benzonatate (TESSALON) 100 mg capsule, sodium chloride (SALINE MIST) 0.65 % nasal spray, fexofenadine (ALLEGRA) 180 mg tablet   Pt education given on antibiotic use and dangers of doing so when they are not warranted. Pt advised to f/u with ENT as she was referred previously for recurrent sore throat. No acute sings of any bacterial infection noted during exam. Pt advised to f/u or  urgent care but no antibiotic given at today's visit. Not advised to take decongestant as it causes palpitations in patient  Follow-up Disposition:  Return in about 1 week (around 05/06/2012).

## 2012-04-29 NOTE — Progress Notes (Signed)
Cassandra Daniels 51 y.o. female   Chief Complaint   Patient presents with   ??? Sore Throat     x 1 day, mnor fever, head ache   ??? Cough     x 1 day, mucus accumulates in the throat       NOTE: Patient Denies Depression.

## 2012-05-04 LAB — AMB POC RAPID STREP A: Group A Strep Ag: NEGATIVE

## 2012-05-04 LAB — AMB POC RAPID INFLUENZA TEST: QuickVue Influenza test: NEGATIVE

## 2012-05-04 NOTE — Progress Notes (Signed)
HISTORY OF PRESENT ILLNESS  Cassandra Daniels is a 51 y.o. female.  Chief Complaint   Patient presents with   ??? Cough     for past week   ??? Fever     at home fever running 99.8-101.4    ??? Sore Throat     for past 8 days   ??? Nausea     for past week nausea comes and goes       HPI  Past Medical History   Diagnosis Date   ??? Arthritis    ??? Fibrocystic breast    ??? Mitral (valve) prolapse    ??? Chronic fatigue    ??? Vitamin B 12 deficiency    ??? Paroxysmal atrial tachycardia    ??? HTN    ??? Meningioma nos    ??? Broken nose 1999     surgery   ??? History of echocardiogram 05/24/2010     EF 60-65%.  Increased wall thickness.  Normal diastolic function.  RVSP 25-30 mmHg.     ??? Myocardial perfusion 06/21/2004     No evidence of scarring or ischemia.  EF >80%.    ??? Atrial fibrillation    ??? Essential hypertension    ??? MVP (mitral valve prolapse)    ??? SVT (supraventricular tachycardia)      Current Outpatient Prescriptions   Medication Sig Dispense Refill   ??? chlorpheniramine-HYDROcodone (TUSSIONEX) 8-10 mg/5 mL suspension Take 5 mL by mouth every twelve (12) hours as needed for Cough.  120 mL  0   ??? benzonatate (TESSALON) 100 mg capsule Take 1 Cap by mouth three (3) times daily as needed for Cough for 7 days.  30 Cap  0   ??? estrogens, conjugated,-methylTESTOSTERone (ESTRATEST HS) 0.625-1.25 mg per tablet Take 1 Tab by mouth daily.       ??? primidone (MYSOLINE) 50 mg tablet TAKE 1 TABLET BY MOUTH EVERY MORNING AND 2 TABLETS BY MOUTH EVERY NIGHT AT BEDTIME  270 Tab  3   ??? zolpidem CR (AMBIEN CR) 12.5 mg tablet Take 1/2 tab at bedtime as needed for sleep  45 Tab  0   ??? clonazePAM (KLONOPIN) 0.5 mg tablet Take 1 Tab by mouth nightly as needed.  90 Tab  0   ??? traMADol (ULTRAM) 50 mg tablet Take 150 mg by mouth daily as needed.       ??? venlafaxine-SR (EFFEXOR-XR) 150 mg capsule Take 1 Cap by mouth daily.  90 Cap  3   ??? exenatide (BYETTA) 5 mcg/0.02 mL injection 5 mcg by SubCUTAneous route two (2) times daily (with meals).       ???  hyoscyamine SL (LEVSIN/SL) 0.125 mg SL tablet Take 1 Tab by mouth every six (6) hours as needed for Cramping.  30 Tab  3   ??? Cholecalciferol, Vitamin D3, (VITAMIN D) 1,000 unit Cap Take 1,000 Int'l Units by mouth two (2) times a day.       ??? HYDROcodone-acetaminophen (VICODIN) 5-500 mg per tablet Take 1 Tab by mouth as needed. For pain.       ??? CYANOCOBALAMIN, VITAMIN B-12, (VITAMIN B-12 PO) Take 1 Tab by mouth every seven (7) days.       ??? sodium chloride (SALINE MIST) 0.65 % nasal spray 1 Spray by Both Nostrils route as needed for Congestion.       ??? fexofenadine (ALLEGRA) 180 mg tablet Take 1 Tab by mouth daily.  30 Tab       Allergies  Allergen Reactions   ??? Beta Blocker (Beta-Blockers (Beta-Adrenergic Blocking Agts)) Other (comments)     Fatigue   ??? Tetracycline Unknown (comments)       Review of Systems   Constitutional: Positive for fever.   HENT: Positive for congestion and sore throat.    Respiratory: Positive for cough.    Neurological: Positive for headaches.     BP 137/85   Pulse 98   Temp 98.5 ??F (36.9 ??C) (Oral)   Resp 20   Ht 5\' 2"  (1.575 m)   Wt 174 lb (78.926 kg)   BMI 31.83 kg/m2   SpO2 97%    Physical Exam   Nursing note reviewed.  Constitutional: She appears well-developed and well-nourished. She appears distressed (appears moderately ill).   HENT:   Right Ear: External ear normal. Tympanic membrane is not injected.   Left Ear: External ear normal. Tympanic membrane is not injected.   Mouth/Throat: Oropharynx is clear and moist. No oropharyngeal exudate.   Cardiovascular: Normal rate, regular rhythm and normal heart sounds.    Pulmonary/Chest: Effort normal and breath sounds normal. No respiratory distress. She has no wheezes. She has no rales.   Musculoskeletal: She exhibits no edema.   Lymphadenopathy:     She has cervical adenopathy.   Skin: No rash noted. No erythema. No pallor.     Results for orders placed in visit on 05/04/12   AMB POC RAPID STREP A       Component Value Range    VALID  INTERNAL CONTROL POC Yes      Group A Strep Ag Negative  Negative   AMB POC RAPID INFLUENZA TEST       Component Value Range    VALID INTERNAL CONTROL POC Yes      QuickVue Influenza test Negative  Negative       ASSESSMENT and PLAN  1. Sore throat  AMB POC RAPID STREP A, AMB POC RAPID INFLUENZA TEST, levofloxacin (LEVAQUIN) 500 mg tablet   2. Cough  AMB POC RAPID STREP A, AMB POC RAPID INFLUENZA TEST, levofloxacin (LEVAQUIN) 500 mg tablet   3. Fever  AMB POC RAPID STREP A, AMB POC RAPID INFLUENZA TEST, levofloxacin (LEVAQUIN) 500 mg tablet   4. Nausea  AMB POC RAPID STREP A, AMB POC RAPID INFLUENZA TEST

## 2012-05-04 NOTE — Progress Notes (Signed)
Chief Complaint   Patient presents with   ??? Cough     for past week   ??? Fever     at home fever running 99.8-101.4    ??? Sore Throat     for past 8 days   ??? Nausea     for past week nausea comes and goes   Patient c/o past week with cough, fever at home ranging from 99.8 to 101.4, sore throat, nausea comes and goes, weakness.  Patient denies any body aches, no ear pain, no chills.  NOTE:  Patient denies any depression today.

## 2012-05-06 ENCOUNTER — Encounter

## 2012-05-06 NOTE — Telephone Encounter (Signed)
Pt had called back after her visit on 05/04/12 and asked for more Tussionex(had received Tussionex on 04/29/12 when she saw D. Fredric Mare, FNP). Dr. Lamar Benes said no because she had already received some and she had given her a Rx for [employer]. Patient called back today, I did ask Dr. Doree Barthel if he wanted to order her anymore. He said no, she should still have some left according to the directions and she could take her Tessalon Perles. I explained that to the patient and she wants me to ask Dr. Lamar Benes tomorrow if she can have it. (Dr. Lamar Benes not in office today). I told her I would ask and let her know.

## 2012-05-06 NOTE — Telephone Encounter (Signed)
Message copied by Antonieta Pert on Thu May 06, 2012  4:30 PM  ------       Message from: Cordelia Poche       Created: Thu May 06, 2012  1:56 PM       Contact: [phone]         Pt calling to see if more cough medication could be called in for her,wants the tussinex,already has a presciption for tusselon pearls,has not filled as says this does not help.

## 2012-05-07 ENCOUNTER — Encounter

## 2012-05-07 NOTE — Telephone Encounter (Signed)
Phoned in to [facility] at pt's request.

## 2012-05-07 NOTE — Telephone Encounter (Signed)
Message copied by Antonieta Pert on Fri May 07, 2012 12:41 PM  ------       Message from: Cordelia Poche       Created: Thu May 06, 2012  1:56 PM       Contact: [phone]         Pt calling to see if more cough medication could be called in for her,wants the tussinex,already has a presciption for tusselon pearls,has not filled as says this does not help.

## 2012-05-07 NOTE — Telephone Encounter (Signed)
Per Dr. Lupita Raider instructions patient notified she will give her some more Tussionex but she can not take more than 1 tsp twice a day. She said OK. She also said she did not feel any better. I told her she is on a very strong antibiotic for a 10 day course and she has only taken it for 3 days. She said she was taking Augmentin before this but that did not help. She said she went to work for a half a day for the last 2 days but stayed home today. I told her she should not be going to work, stay home and rest, drink plenty of fluids and I would let Dr. Lamar Benes know. She said OK. Dr. Lamar Benes notified.

## 2012-05-10 NOTE — Progress Notes (Signed)
HISTORY OF PRESENT ILLNESS  Cassandra Daniels is a 51 y.o. female.  Chief Complaint   Patient presents with   ??? Follow-up     Patient states cough has gotten worse and still have some nausea.  The fever and sore throat has cleared.     Complaint of ongoing cough with coughing spells.  She is taking all of her Tussionex and needs a refill, but her insurance will not allow this except for every 22 days.  Tessalon pearls are ineffective Cassandra Daniels seems to be only thing that has given her some relief.  She feels that her symptoms are worsening over the past several days since running out of her cough syrup.  In the past she has had to use an Advair inhaler prescribed by Dr. Betha Loa previously.  She does not have any prior diagnosis of history of asthma.  HPI  Past Medical History   Diagnosis Date   ??? Arthritis    ??? Fibrocystic breast    ??? Mitral (valve) prolapse    ??? Chronic fatigue    ??? Vitamin B 12 deficiency    ??? Paroxysmal atrial tachycardia    ??? HTN    ??? Meningioma nos    ??? Broken nose 1999     surgery   ??? History of echocardiogram 05/24/2010     EF 60-65%.  Increased wall thickness.  Normal diastolic function.  RVSP 25-30 mmHg.     ??? Myocardial perfusion 06/21/2004     No evidence of scarring or ischemia.  EF >80%.    ??? Atrial fibrillation    ??? Essential hypertension    ??? MVP (mitral valve prolapse)    ??? SVT (supraventricular tachycardia)      Current Outpatient Prescriptions   Medication Sig Dispense Refill   ??? chlorpheniramine-HYDROcodone (TUSSIONEX) 8-10 mg/5 mL suspension Take 5 mL by mouth every twelve (12) hours as needed for Cough.  120 mL  0   ??? levofloxacin (LEVAQUIN) 500 mg tablet Take 1 Tab by mouth daily.  10 Tab  1   ??? sodium chloride (SALINE MIST) 0.65 % nasal spray 1 Spray by Both Nostrils route as needed for Congestion.       ??? fexofenadine (ALLEGRA) 180 mg tablet Take 1 Tab by mouth daily.  30 Tab     ??? estrogens, conjugated,-methylTESTOSTERone (ESTRATEST HS) 0.625-1.25 mg per tablet  Take 1 Tab by mouth daily.       ??? primidone (MYSOLINE) 50 mg tablet TAKE 1 TABLET BY MOUTH EVERY MORNING AND 2 TABLETS BY MOUTH EVERY NIGHT AT BEDTIME  270 Tab  3   ??? zolpidem CR (AMBIEN CR) 12.5 mg tablet Take 1/2 tab at bedtime as needed for sleep  45 Tab  0   ??? clonazePAM (KLONOPIN) 0.5 mg tablet Take 1 Tab by mouth nightly as needed.  90 Tab  0   ??? traMADol (ULTRAM) 50 mg tablet Take 150 mg by mouth daily as needed.       ??? venlafaxine-SR (EFFEXOR-XR) 150 mg capsule Take 1 Cap by mouth daily.  90 Cap  3   ??? exenatide (BYETTA) 5 mcg/0.02 mL injection 5 mcg by SubCUTAneous route two (2) times daily (with meals).       ??? hyoscyamine SL (LEVSIN/SL) 0.125 mg SL tablet Take 1 Tab by mouth every six (6) hours as needed for Cramping.  30 Tab  3   ??? Cholecalciferol, Vitamin D3, (VITAMIN D) 1,000 unit Cap Take 1,000 Int'l Units  by mouth two (2) times a day.       ??? HYDROcodone-acetaminophen (VICODIN) 5-500 mg per tablet Take 1 Tab by mouth as needed. For pain.       ??? CYANOCOBALAMIN, VITAMIN B-12, (VITAMIN B-12 PO) Take 1 Tab by mouth every seven (7) days.         Allergies   Allergen Reactions   ??? Beta Blocker (Beta-Blockers (Beta-Adrenergic Blocking Agts)) Other (comments)     Fatigue   ??? Tetracycline Unknown (comments)         Review of Systems   Constitutional: Negative for fever and chills.   Respiratory: Positive for cough and shortness of breath.      BP 130/88   Pulse 111   Temp 98.9 ??F (37.2 ??C) (Oral)   Resp 16    Physical Exam   Nursing note and vitals reviewed.  Constitutional: She appears well-developed and well-nourished. She appears distressed (appears moderately ill and fatigued).   HENT:   Mouth/Throat: Oropharynx is clear and moist.   Pulmonary/Chest: Effort normal. No respiratory distress. She has no wheezes. She has no rales.        Incomplete insp effort     Musculoskeletal: She exhibits no edema.   Lymphadenopathy:     She has no cervical adenopathy.   Skin: No pallor.       ASSESSMENT and PLAN  1.  Acute bronchitis with bronchospasm  XR CHEST PA LAT, guaiFENesin-codeine (ROBITUSSIN AC) 10-100 mg/5 mL solution, predniSONE (DELTASONE) 10 mg tablet   2. Cough  XR CHEST PA LAT, guaiFENesin-codeine (ROBITUSSIN AC) 10-100 mg/5 mL solution, predniSONE (DELTASONE) 10 mg tablet

## 2012-05-10 NOTE — Patient Instructions (Signed)
Acute Cough: After Your Visit  Your Care Instructions  A cough is the body's way of keeping the lungs clear. A cough can be short-term (acute) or long-term (chronic). An acute cough lasts less than 3 weeks.  A cough is not a disease but is a symptom of a health problem. An acute cough is often caused by a cold or other upper respiratory tract illness.  There are different types of coughs:  ?? A productive cough brings up mucus from the lungs.   ?? A nonproductive cough is a dry cough that does not bring up mucus. You may get a dry, hacking cough after a cold or after being exposed to dust or smoke.   Follow-up care is a key part of your treatment and safety. Be sure to make and go to all appointments, and call your doctor if you are having problems. It's also a good idea to know your test results and keep a list of the medicines you take.  How can you care for yourself at home?  ?? Fluids may soothe an irritated throat. Honey in hot water, tea, or lemon juice helps a dry, hacking cough.   ?? Prop up your head with extra pillows at night to ease a dry cough.   ?? Try a cough drop to soothe your throat. Expensive medicine-flavored cough drops are no better than inexpensive candy-flavored drops or hard candy. Most cough drops don't stop a cough.   ?? Do not smoke. Smoking can make a cough worse. If you need help quitting, talk to your doctor about stop-smoking programs and medicines. These can increase your chances of quitting for good.   ?? Avoid exposure to smoke, dust, or other pollutants, or wear a face mask that is appropriate for the exposure. Check with your doctor or pharmacist to find out which type of face mask will give you the most benefit.   ?? If your doctor prescribes cough medicine, take it exactly as prescribed. Call your doctor if you think you are having a problem with your medicine. Do not take someone else's prescription cough medicine.   ?? If you are not taking prescription cough medicine, ask your  doctor if you can take over-the-counter cough medicine.   ?? Expectorant cough medicines help thin the mucus and make it easier to cough mucus up when you have a productive cough. Look for expectorants that contain guaifenesin, such as Mucinex, Robitussin, or Vicks 44 Chesty Cough.   ?? Suppressant cough medicines control or suppress the cough reflex and work best for a dry, hacking cough that keeps you awake. Look for suppressant medicines that contain dextromethorphan, such as Robitussin-DM or Vicks 44 Dry Cough Suppressant.   ?? Avoid cough medicines that treat more than a cough. For example, don't use a medicine that treats a cough and a stuffy nose.   ?? Be careful when taking over-the-counter cold or flu medicines and Tylenol at the same time. Many of these medicines have acetaminophen, which is Tylenol. Read the labels to make sure that you are not taking more than the recommended dose. Too much acetaminophen (Tylenol) can be harmful.   When should you call for help?  Call 911 anytime you think you may need emergency care. For example, call if:  ?? You have severe trouble breathing.   Call your doctor now or seek immediate medical care if:  ?? You have new or increased shortness of breath.   ?? You have a new or higher   fever.   ?? You have new symptoms, such as coughing up blood.   ?? You feel much worse.   Watch closely for changes in your health, and be sure to contact your doctor if you are not getting better as expected.    Where can you learn more?    Go to http://www.healthwise.net/[facility]   Enter X871 in the search box to learn more about "Acute Cough: After Your Visit."    ?? 2006-2013 Healthwise, Incorporated. Care instructions adapted under license by [facility] (which disclaims liability or warranty for this information). This care instruction is for use with your licensed healthcare professional. If you have questions about a medical condition or this instruction, always ask your healthcare  professional. Healthwise, Incorporated disclaims any warranty or liability for your use of this information.  Content Version: 9.7.130178; Last Revised: March 06, 2011          Bronchitis: After Your Visit  Your Care Instructions  Bronchitis is inflammation of the bronchial tubes, which carry air to the lungs. The tubes swell and produce mucus, or phlegm. The mucus and inflamed bronchial tubes make you cough. You may have trouble breathing.  Most cases of bronchitis are caused by viruses like those that cause colds. Antibiotics usually do not help and they may be harmful.  Bronchitis usually develops rapidly and lasts about 2 to 3 weeks in otherwise healthy people.  Follow-up care is a key part of your treatment and safety. Be sure to make and go to all appointments, and call your doctor if you are having problems. It's also a good idea to know your test results and keep a list of the medicines you take.  How can you care for yourself at home?  ?? Take all medicines exactly as prescribed. Call your doctor if you think you are having a problem with your medicine.   ?? Get some extra rest.   ?? Take an over-the-counter pain medicine, such as acetaminophen (Tylenol), ibuprofen (Advil, Motrin), or naproxen (Aleve) to reduce fever and relieve body aches. Read and follow all instructions on the label.   ?? Do not take two or more pain medicines at the same time unless the doctor told you to. Many pain medicines have acetaminophen, which is Tylenol. Too much acetaminophen (Tylenol) can be harmful.   ?? Take an over-the-counter cough medicine that contains dextromethorphan to help quiet a dry, hacking cough so that you can sleep. Avoid cough medicines that have more than one active ingredient. Read and follow all instructions on the label.   ?? Breathe moist air from a humidifier, hot shower, or sink filled with hot water. The heat and moisture will thin mucus so you can cough it out.   ?? Do not smoke. Smoking can make bronchitis  worse. If you need help quitting, talk to your doctor about stop-smoking programs and medicines. These can increase your chances of quitting for good.   When should you call for help?  Call 911 anytime you think you may need emergency care. For example, call if:  ?? You have severe trouble breathing.   Call your doctor now or seek immediate medical care if:  ?? You have new or worse trouble breathing.   ?? You cough up dark brown or bloody mucus (sputum).   ?? You have a new or higher fever.   ?? You have a new rash.   Watch closely for changes in your health, and be sure to contact your doctor   if:  ?? You cough more deeply or more often, especially if you notice more mucus or a change in the color of your mucus.   ?? You are not getting better as expected.     Where can you learn more?    Go to http://www.healthwise.net/[facility]   Enter H333 in the search box to learn more about "Bronchitis: After Your Visit."    ?? 2006-2013 Healthwise, Incorporated. Care instructions adapted under license by [facility] (which disclaims liability or warranty for this information). This care instruction is for use with your licensed healthcare professional. If you have questions about a medical condition or this instruction, always ask your healthcare professional. Healthwise, Incorporated disclaims any warranty or liability for your use of this information.  Content Version: 9.7.130178; Last Revised: April 09, 2011

## 2012-05-10 NOTE — Addendum Note (Signed)
Addended by: Antonieta Pert on: 05/10/2012 04:56 PM     Modules accepted: Orders

## 2012-05-10 NOTE — Progress Notes (Signed)
Chief Complaint   Patient presents with   ??? Follow-up     Patient states cough has gotten worse and still have some nausea.  The fever and sore throat has cleared.

## 2012-05-10 NOTE — Addendum Note (Signed)
Addended by: Tonette Lederer D on: 05/10/2012 05:14 PM     Modules accepted: Orders

## 2012-05-14 NOTE — Progress Notes (Signed)
Chief Complaint   Patient presents with   ??? Follow-up     4 day f/u on bronchitis.  Patient states cough has gotten only a little better.

## 2012-05-14 NOTE — Progress Notes (Signed)
HISTORY OF PRESENT ILLNESS  Nakeisha Bunch is a 51 y.o. female.  Chief Complaint   Patient presents with   ??? Follow-up     4 day f/u on bronchitis.  Patient states cough has gotten only a little better.       HPI  Past Medical History   Diagnosis Date   ??? Arthritis    ??? Fibrocystic breast    ??? Mitral (valve) prolapse    ??? Chronic fatigue    ??? Vitamin B 12 deficiency    ??? Paroxysmal atrial tachycardia    ??? HTN    ??? Meningioma nos    ??? Broken nose 1999     surgery   ??? History of echocardiogram 05/24/2010     EF 60-65%.  Increased wall thickness.  Normal diastolic function.  RVSP 25-30 mmHg.     ??? Myocardial perfusion 06/21/2004     No evidence of scarring or ischemia.  EF >80%.    ??? Atrial fibrillation    ??? Essential hypertension    ??? MVP (mitral valve prolapse)    ??? SVT (supraventricular tachycardia)      Current Outpatient Prescriptions   Medication Sig Dispense Refill   ??? guaiFENesin-codeine (ROBITUSSIN AC) 10-100 mg/5 mL solution Take 5-10 mL by mouth three (3) times daily as needed for Cough or Congestion.  120 mL  1   ??? predniSONE (DELTASONE) 10 mg tablet Take 4 pills x2 days, then 3 pills x2 days, then 2 pills x2 days then 1 pill x2 days.  Take with food.  20 Tab  0   ??? estrogens, conjugated,-methylTESTOSTERone (ESTRATEST HS) 0.625-1.25 mg per tablet Take 1 Tab by mouth daily.       ??? primidone (MYSOLINE) 50 mg tablet TAKE 1 TABLET BY MOUTH EVERY MORNING AND 2 TABLETS BY MOUTH EVERY NIGHT AT BEDTIME  270 Tab  3   ??? clonazePAM (KLONOPIN) 0.5 mg tablet Take 1 Tab by mouth nightly as needed.  90 Tab  0   ??? traMADol (ULTRAM) 50 mg tablet Take 150 mg by mouth daily as needed.       ??? venlafaxine-SR (EFFEXOR-XR) 150 mg capsule Take 1 Cap by mouth daily.  90 Cap  3   ??? exenatide (BYETTA) 5 mcg/0.02 mL injection 5 mcg by SubCUTAneous route two (2) times daily (with meals).       ??? hyoscyamine SL (LEVSIN/SL) 0.125 mg SL tablet Take 1 Tab by mouth every six (6) hours as needed for Cramping.  30 Tab  3   ???  Cholecalciferol, Vitamin D3, (VITAMIN D) 1,000 unit Cap Take 1,000 Int'l Units by mouth two (2) times a day.       ??? HYDROcodone-acetaminophen (VICODIN) 5-500 mg per tablet Take 1 Tab by mouth as needed. For pain.       ??? CYANOCOBALAMIN, VITAMIN B-12, (VITAMIN B-12 PO) Take 1 Tab by mouth every seven (7) days.         Allergies   Allergen Reactions   ??? Beta Blocker (Beta-Blockers (Beta-Adrenergic Blocking Agts)) Other (comments)     Fatigue   ??? Tetracycline Unknown (comments)       Review of Systems   Constitutional: Negative for fever and chills.   Respiratory: Positive for cough.      BP 147/95   Pulse 106   Temp 98.6 ??F (37 ??C) (Oral)   Resp 16   Ht 5\' 2"  (1.575 m)   Wt 175 lb 4 oz (79.493 kg)  BMI 32.05 kg/m2    Physical Exam   Nursing note and vitals reviewed.  Constitutional: She appears well-developed and well-nourished. No distress.   HENT:   Right Ear: External ear normal.   Left Ear: External ear normal.   Mouth/Throat: Oropharynx is clear and moist. No oropharyngeal exudate.   Cardiovascular: Normal rate, regular rhythm and normal heart sounds.    Pulmonary/Chest: Effort normal and breath sounds normal. No respiratory distress. She has no wheezes. She has no rales.   Musculoskeletal: She exhibits no edema.     History: Bronchospasm   Comparison 07/20/09   PA AND LATERAL CHEST 2 VIEWS   CPT: 71020   The heart and lungs and bony structures appear unremarkable for a patient of   this age.   IMPRESSION:   Normal chest.    ASSESSMENT and PLAN  1. Acute bronchitis with bronchospasm  METHYLPREDNISOLONE ACETATE INJECTION 40 MG, PR THER/PROPH/DIAG INJECTION, SUBCUT/IM, methylPREDNISolone (SOLU-MEDROL) 40 mg/mL solutionn, REFERRAL TO PULMONARY DISEASE, albuterol (VENTOLIN HFA) 90 mcg/actuation inhaler   2. Cough  METHYLPREDNISOLONE ACETATE INJECTION 40 MG, PR THER/PROPH/DIAG INJECTION, SUBCUT/IM, methylPREDNISolone (SOLU-MEDROL) 40 mg/mL solutionn, REFERRAL TO PULMONARY DISEASE, albuterol (VENTOLIN HFA) 90  mcg/actuation inhaler   still uncontrolled and not feeling well

## 2012-06-07 ENCOUNTER — Encounter

## 2012-06-18 NOTE — Addendum Note (Signed)
Addended by: Bonney Leitz ANN on: 06/18/2012 03:11 PM     Modules accepted: Level of Service

## 2012-06-23 NOTE — Patient Instructions (Addendum)
Call and schedule an appointment for a cough productive of discolored mucus or a cough which does not resolve promptly  Start exercising at least 4 times a week at first walking only about 10 min. A day and gradually improving with time to walk with a goal of 30 min. Daily  Obtain cardiac stress test Dr. Luanna Cole office she should call concerning the test appointment  Obtain flu vaccine

## 2012-06-23 NOTE — Progress Notes (Addendum)
[facility]  Pulmonary, Critical Care, and Sleep Medicine    Dear Dr. Lamar Benes,,    Chief complaint:  cough    HPI:  Cassandra Daniels is 51 years old and comes to the office today at your request concerning a chronic cough which the patient relates she's had for many years usually about 3 times a year when she has a respiratory infection.  She says that she has mucus production with his cough but does not cough it up and usually swallows the phlegm.  She denies hemoptysis or shortness of breath and leg pain or swelling except for the discomfort related to her knees.  She denies PND orthopnea and has had a negative test for sleep apnea in the past.  She denies any gastroesophageal reflux symptoms or any sinus symptoms such as facial pain post nasal drip or significant allergies for several years.  She does have chest pain only for the last year approximately 4 or 5 times which is substernal nonradiating and not particularly related to any activity but it usually lasts for at least 5 min.or more and she says it does not feel like indigestion.  She denies fever chills poor appetite and weight loss.   Allergies   Allergen Reactions   ??? Beta Blocker (Beta-Blockers (Beta-Adrenergic Blocking Agts)) Other (comments)     Fatigue   ??? Tetracycline Hives     Current Outpatient Prescriptions   Medication Sig   ??? BENAZEPRIL HCL (LOTENSIN PO) Take 5 mg by mouth daily.   ??? albuterol (VENTOLIN HFA) 90 mcg/actuation inhaler Take 2 Puffs by inhalation every six (6) hours as needed for Wheezing or Shortness of Breath (cough).   ??? estrogens, conjugated,-methylTESTOSTERone (ESTRATEST HS) 0.625-1.25 mg per tablet Take 1 Tab by mouth daily.   ??? primidone (MYSOLINE) 50 mg tablet TAKE 1 TABLET BY MOUTH EVERY MORNING AND 2 TABLETS BY MOUTH EVERY NIGHT AT BEDTIME   ??? clonazePAM (KLONOPIN) 0.5 mg tablet Take 1 Tab by mouth nightly as needed.   ??? traMADol (ULTRAM) 50 mg tablet Take 150 mg by mouth daily as needed.   ???  venlafaxine-SR (EFFEXOR-XR) 150 mg capsule Take 1 Cap by mouth daily.   ??? exenatide (BYETTA) 5 mcg/0.02 mL injection 5 mcg by SubCUTAneous route two (2) times daily (with meals).   ??? hyoscyamine SL (LEVSIN/SL) 0.125 mg SL tablet Take 1 Tab by mouth every six (6) hours as needed for Cramping.   ??? Cholecalciferol, Vitamin D3, (VITAMIN D) 1,000 unit Cap Take 1,000 Int'l Units by mouth two (2) times a day.   ??? HYDROcodone-acetaminophen (VICODIN) 5-500 mg per tablet Take 1 Tab by mouth as needed. For pain.   ??? CYANOCOBALAMIN, VITAMIN B-12, (VITAMIN B-12 PO) Take 1 Tab by mouth every seven (7) days.   ??? guaiFENesin-codeine (ROBITUSSIN AC) 10-100 mg/5 mL solution Take 5-10 mL by mouth three (3) times daily as needed for Cough or Congestion.   ??? predniSONE (DELTASONE) 10 mg tablet Take 4 pills x2 days, then 3 pills x2 days, then 2 pills x2 days then 1 pill x2 days.  Take with food.     No current facility-administered medications for this visit.     Past Medical History   Diagnosis Date   ??? Arthritis    ??? Fibrocystic breast    ??? Mitral (valve) prolapse    ??? Chronic fatigue    ??? Vitamin B 12 deficiency    ??? Paroxysmal atrial tachycardia    ??? HTN    ???  Meningioma nos    ??? Broken nose 1999     surgery   ??? History of echocardiogram 05/24/2010     EF 60-65%.  Increased wall thickness.  Normal diastolic function.  RVSP 25-30 mmHg.     ??? Myocardial perfusion 06/21/2004     No evidence of scarring or ischemia.  EF >80%.    ??? Atrial fibrillation    ??? Essential hypertension    ??? MVP (mitral valve prolapse)    ??? SVT (supraventricular tachycardia)    she denies a history of coronary artery disease and diabetes mellitus although she says she is borderline for this kidney disease liver disease ulcers TB cancer asthma or emphysema  Past Surgical History   Procedure Laterality Date   ??? Hx knee arthroscopy     ??? Hx rhinoplasty     ??? Hx hysterectomy     ??? 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 Hx bladder repair  1996   ??? Ablation of svt  2007     by  Dr. Sophronia Simas   ??? Mam biopsy breast stereotactic  april, 2013     right, benign       Family history: hypertension       Social History: lifelong nonsmoker     Review of systems:  She denies syncope focal muscle weakness and numbness trouble seeing trouble hearing trouble swallowing or choking on food chronic abdominal pain melena or blood in her stools dysuria hematuria rash as she does have arthritic complaints in her hands and knees    Physical Exam:  BP 120/70   Pulse 82   Temp(Src) 99.1 ??F (37.3 ??C)   Resp 18   Ht 5' 2.25" (1.581 m)   Wt 78.472 kg (173 lb)   BMI 31.39 kg/m2   SpO2 96%  Well-developed well-nourished  HEENT: pupils equal, reactive, sclera, non-icteric   Oropharynx tongue: normal   Neck: Supple   Lymph Nodes: Supra clavicular and cervical nodes, negative   Chest: Equal symmetrical expansion, no dullness, no wheezes, rales or rubs   Heart: Regular, rhythm without gallup or murmur no carotid bruits  Abdomen: soft, non-tender no masses or organomegaly   Extremities: no cyanosis, clubbing, no edema no calf tenderness  Skin: No rash   Neurological: alert, oriented, moves all extremities  Musculoskeletal: No acute joint inflammation or muscle wasting    LABS:  O2 sat 96% room air at rest  Chest x-ray 05/10/12: Normal appearing chest  Spirometry: 06/22/12: No airflow obstruction    Impression:   Cough which is occurred 3 times a year for many years and always associated with the respiratory infection however the patient has no symptoms of chronic sinusitis to suggest a chronic conditions such as immunological deficiency .  Also her x-ray does not suggest any type of chronic infectious process or mechanical obstruction and her pulmonary function tests indicate that she does not have asthma  Unexplained chest pain  Plan:  The patient was told I could not find an obvious cause for her recurrent cough and advised to return if her reoccurs  She was also given a modest exercise program  She was also given a plan  for obtaining a nuclear cardiac stress test with Dr. Luanna Cole office  The patient was advised to obtain a yearly flu vaccine    Thanks for allowing me to share in this patient's evaluation    Sincerely    Cassandra Broadfoot Jerolyn Shin, MD , FCCP    CC:  Lavonne Chick, MD          Bronson Curb, M.D.    [address] [place], [place] [address]     P: [phone]     F: [phone]

## 2012-07-07 MED ADMIN — sodium chloride (NS) flush 10 mL: INTRAVENOUS | @ 17:00:00 | NDC 87701099893

## 2012-07-07 MED ADMIN — regadenoson (LEXISCAN) injection 0.4 mg: INTRAVENOUS | @ 17:00:00 | NDC 00469650189

## 2012-07-07 MED ADMIN — sodium chloride (NS) flush 10 mL: INTRAVENOUS | @ 14:00:00 | NDC 87701099893

## 2012-07-07 MED FILL — BD POSIFLUSH NORMAL SALINE 0.9 % INJECTION SYRINGE: INTRAMUSCULAR | Qty: 10

## 2012-07-07 MED FILL — LEXISCAN 0.4 MG/5 ML INTRAVENOUS SYRINGE: 0.4 mg/5 mL | INTRAVENOUS | Qty: 5

## 2012-07-07 NOTE — Telephone Encounter (Signed)
Patient wants office note to go to Dr Wess Botts.

## 2012-07-07 NOTE — Progress Notes (Signed)
Patient was injected with 10.61 millicuries Sestamibi on 07/07/12 at 0905.    Patient was injected with 33.0 millicuries Sestamibi on 07/07/12 at 1145.    Patient's armbands were removed and placed in shred-it box.

## 2012-07-08 NOTE — Procedures (Signed)
[facility]                            [facility]                               [address]                          [place], [place] [address]                           NUCLEAR CARDIOLOGY REPORT    PATIENT:    ADALYNN, CORNE  MRN:        [id number]  BILLING:    [id number]  DATE:       07/07/2012  LOCATION:  REFERRING:  Lavonne Chick, MD  DICTATING:  Trina Ao, DO      LEXISCAN CARDIOLITE MYOCARDIAL PERFUSION STUDY    ORDERING PHYSICIAN: Dr. Neale Burly    OTHER PHYSICIAN: Tonette Lederer, MD    REASON FOR STUDY: Chest pain.    ELECTROCARDIOGRAM RESTING: Normal sinus rhythm with a rate of 88 and some  nonspecific inferolateral ST and T changes.    PROCEDURE: The patient received 10.61 mCi of Cardiolite intravenously and  had a resting myocardial perfusion study completed. She subsequently  received Lexiscan 0.4 mg intravenously followed by a 5 mL saline flush and  was asked to walk at 0.8 miles per hour at 0 grade for 3 minutes. She  complained of flushing, leg weakness and chest pain with Lexiscan  administration, but had no significant electrocardiographic changes  over  baseline. She subsequently received 33 mCi of Cardiolite intravenously and  had a stress myocardial perfusion study completed and compared to the  resting study.    FINDINGS: There appeared to be normal perfusion throughout the left  ventricular myocardium without signs of ischemia or infarction. Gated SPECT  imaging demonstrated very low normal left ventricular volumes, with normal  wall motion and hyperdynamic left ventricular function, with an ejection  fraction in excess of 80%.    SUMMARY  1. Normal Lexiscan Cardiolite myocardial perfusion study.  2. Normal perfusion throughout the left ventricular myocardium without  signs of ischemia or infarction.  3. Low normal left ventricular volumes, with normal wall motion and  hyperdynamic left ventricular function, ejection fraction  of greater than  80% on gated SPECT imaging.  4. Negative pharmacologic stress test electrocardiographically.  5. This was a low risk Lexiscan Cardiolite myocardial perfusion study.                           Trina Ao, DO      ES:wmx  D: 07/07/2012  CScript:07/08/2012 12:57 A  CQDocID #: [id number]   CScriptDoc #:  [id number]   cc:   ERIC Jerolyn Shin, MD         Lavonne Chick, MD         Trina Ao, DO  DR. Neale Burly

## 2012-07-08 NOTE — Procedures (Signed)
[facility]                            [facility]                               [address]                          [place], [place]  [address]                           NUCLEAR CARDIOLOGY REPORT    PATIENT:    [name], Cassandra Daniels  MRN:        [id number]  BILLING:    [id number]  DATE:       07/07/2012  LOCATION:  REFERRING:  KAREN D RUSH, MD  DICTATING:  Tarae Wooden, DO      LEXISCAN CARDIOLITE MYOCARDIAL PERFUSION STUDY    ORDERING PHYSICIAN: Dr. Freeman    OTHER PHYSICIAN: Karen Rush, MD    REASON FOR STUDY: Chest pain.    ELECTROCARDIOGRAM RESTING: Normal sinus rhythm with a rate of 88 and some  nonspecific inferolateral ST and T changes.    PROCEDURE: The patient received 10.61 mCi of Cardiolite intravenously and  had a resting myocardial perfusion study completed. She subsequently  received Lexiscan 0.4 mg intravenously followed by a 5 mL saline flush and  was asked to walk at 0.8 miles per hour at 0 grade for 3 minutes. She  complained of flushing, leg weakness and chest pain with Lexiscan  administration, but had no significant electrocardiographic changes  over  baseline. She subsequently received 33 mCi of Cardiolite intravenously and  had a stress myocardial perfusion study completed and compared to the  resting study.    FINDINGS: There appeared to be normal perfusion throughout the left  ventricular myocardium without signs of ischemia or infarction. Gated SPECT  imaging demonstrated very low normal left ventricular volumes, with normal  wall motion and hyperdynamic left ventricular function, with an ejection  fraction in excess of 80%.    SUMMARY  1. Normal Lexiscan Cardiolite myocardial perfusion study.  2. Normal perfusion throughout the left ventricular myocardium without  signs of ischemia or infarction.  3. Low normal left ventricular volumes, with normal wall motion and  hyperdynamic left ventricular function, ejection fraction  of greater than  80% on gated SPECT imaging.  4. Negative pharmacologic stress test electrocardiographically.  5. This was a low risk Lexiscan Cardiolite myocardial perfusion study.                           Vieno Tarrant, DO      ES:wmx  D: 07/07/2012  CScript:07/08/2012 12:57 A  CQDocID #: [id number]   CScriptDoc #:  [id number]   cc:   ERIC J. FREEMAN, MD         KAREN D RUSH, MD         Javeria Briski, DO  DR. FREEMAN

## 2012-08-03 ENCOUNTER — Encounter

## 2012-08-25 ENCOUNTER — Encounter

## 2012-08-25 LAB — CREATININE, POC
Creatinine, POC: 0.8 MG/DL (ref 0.6–1.3)
GFRAA, POC: >60 mL/min/{1.73_m2} (ref >60)
GFRNA, POC: >60 mL/min/{1.73_m2} (ref >60)

## 2012-08-25 LAB — SENTARA SPECIMEN COLLN.

## 2012-08-25 MED ADMIN — ioversol (OPTIRAY) 320 mg iodine/mL contrast injection 100 mL: INTRAVENOUS | @ 20:00:00 | NDC 00019132311

## 2012-08-25 MED FILL — OPTIRAY 320 MG IODINE/ML INTRAVENOUS SOLUTION: 320 mg iodine/mL | INTRAVENOUS | Qty: 100

## 2012-10-07 LAB — SENTARA SPECIMEN COLLN.

## 2012-10-11 LAB — SENTARA SPECIMEN COLLN.

## 2012-10-21 ENCOUNTER — Encounter

## 2012-12-08 ENCOUNTER — Encounter

## 2012-12-13 LAB — SENTARA SPECIMEN COLLN.

## 2012-12-13 MED ADMIN — barium sulfate (EZ PAQUE) 96 % (w/w) contrast suspension 352 g: ORAL | @ 13:00:00 | NDC 32909075003

## 2013-05-30 ENCOUNTER — Encounter

## 2013-06-01 ENCOUNTER — Encounter

## 2013-06-10 MED ADMIN — lidocaine (PF) (XYLOCAINE) 10 mg/mL (1 %) injection soln 1-5 mL: SUBCUTANEOUS | @ 16:00:00 | NDC 00409471365

## 2013-06-10 MED ADMIN — iothalamate meglumine (CONRAY 60) 60 % contrast solution 1-50 mL: INTRA_ARTICULAR | @ 16:00:00 | NDC 00019095315

## 2013-06-10 MED ADMIN — sodium chloride 0.9 % injection 20 mL: INTRAVENOUS | @ 16:00:00 | NDC 63323018610

## 2013-06-10 MED ADMIN — sodium bicarbonate (NEUT) injection 1 mL: SUBCUTANEOUS | @ 16:00:00 | NDC 00409660902

## 2013-10-28 ENCOUNTER — Encounter

## 2013-11-02 NOTE — H&P (Signed)
HISTORY AND PHYSICAL       Patient: Cassandra Daniels                MRN: [id number]       SSN: [id number]  Date of Birth: 1961/02/20        AGE: 53 y.o.        SEX: female      HISTORY:     The patient is a 53 y.o. female who has right shoulder pain. She has increased right shoulder pain with activity, especially with reaching overhead activities. She has pain at night. She has failed conservative treatment with three cortisone injections and activity modification without any improvement.     PAST MEDICAL HISTORY:     Past Medical History   Diagnosis Date   ??? Arthritis    ??? Fibrocystic breast    ??? Mitral (valve) prolapse    ??? Chronic fatigue    ??? Vitamin B 12 deficiency    ??? Paroxysmal atrial tachycardia    ??? HTN    ??? Meningioma nos    ??? Broken nose 1999     surgery   ??? History of echocardiogram 05/24/2010     EF 60-65%.  Increased wall thickness.  Normal diastolic function.  RVSP 25-30 mmHg.     ??? Myocardial perfusion 06/21/2004     No evidence of scarring or ischemia.  EF >80%.    ??? Atrial fibrillation    ??? Essential hypertension    ??? MVP (mitral valve prolapse)    ??? SVT (supraventricular tachycardia)        CURRENT MEDICATIONS:     Current Outpatient Prescriptions   Medication Sig Dispense Refill   ??? BENAZEPRIL HCL (LOTENSIN PO) Take 5 mg by mouth daily.       ??? albuterol (VENTOLIN HFA) 90 mcg/actuation inhaler Take 2 Puffs by inhalation every six (6) hours as needed for Wheezing or Shortness of Breath (cough).  1 Inhaler  1   ??? guaiFENesin-codeine (ROBITUSSIN AC) 10-100 mg/5 mL solution Take 5-10 mL by mouth three (3) times daily as needed for Cough or Congestion.  120 mL  1   ??? predniSONE (DELTASONE) 10 mg tablet Take 4 pills x2 days, then 3 pills x2 days, then 2 pills x2 days then 1 pill x2 days.  Take with food.  20 Tab  0   ??? estrogens, conjugated,-methylTESTOSTERone (ESTRATEST HS) 0.625-1.25 mg per tablet Take 1 Tab by mouth daily.       ??? primidone (MYSOLINE) 50 mg tablet TAKE 1 TABLET BY MOUTH  EVERY MORNING AND 2 TABLETS BY MOUTH EVERY NIGHT AT BEDTIME  270 Tab  3   ??? clonazePAM (KLONOPIN) 0.5 mg tablet Take 1 Tab by mouth nightly as needed.  90 Tab  0   ??? traMADol (ULTRAM) 50 mg tablet Take 150 mg by mouth daily as needed.       ??? venlafaxine-SR (EFFEXOR-XR) 150 mg capsule Take 1 Cap by mouth daily.  90 Cap  3   ??? exenatide (BYETTA) 5 mcg/0.02 mL injection 5 mcg by SubCUTAneous route two (2) times daily (with meals).       ??? hyoscyamine SL (LEVSIN/SL) 0.125 mg SL tablet Take 1 Tab by mouth every six (6) hours as needed for Cramping.  30 Tab  3   ??? Cholecalciferol, Vitamin D3, (VITAMIN D) 1,000 unit Cap Take 1,000 Int'l Units by mouth two (2) times a day.       ??? HYDROcodone-acetaminophen (  VICODIN) 5-500 mg per tablet Take 1 Tab by mouth as needed. For pain.       ??? CYANOCOBALAMIN, VITAMIN B-12, (VITAMIN B-12 PO) Take 1 Tab by mouth every seven (7) days.           ALLERGIES:     Allergies   Allergen Reactions   ??? Beta Blocker [Beta-Blockers (Beta-Adrenergic Blocking Agts)] Other (comments)     Fatigue   ??? Tetracycline Hives       SURGICAL HISTORY:     Past Surgical History   Procedure Laterality Date   ??? Hx knee arthroscopy     ??? Hx rhinoplasty     ??? Hx hysterectomy     ??? 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 Hx bladder repair  1996   ??? Ablation of svt  2007     by Dr. Serita Sheller   ??? Mam biopsy breast stereotactic  april, 2013     right, benign       SOCIAL HISTORY:     History     Social History   ??? Marital Status: MARRIED     Spouse Name: N/A     Number of Children: N/A   ??? Years of Education: N/A     Social History Main Topics   ??? Smoking status: Never Smoker    ??? Smokeless tobacco: Never Used   ??? Alcohol Use: No   ??? Drug Use: Yes     Special: Prescription, OTC   ??? Sexual Activity: Not on file     Other Topics Concern   ??? Not on file     Social History Narrative   ??? No narrative on file       FAMILY HISTORY:     Family History   Problem Relation Age of Onset   ??? Asthma Mother    ??? Breast Cancer Mother 58   ??? Cancer  Mother    ??? Other Father      cabg   ??? Heart Disease Father    ??? High Cholesterol Father    ??? Heart Attack Maternal Grandmother 59   ??? Breast Cancer Maternal Grandmother 50       REVIEW OF SYSTEMS:     Negative for fevers, chills, chest pain, shortness of breath, weight loss, recent illness    General: Negative for fever and chills. No unexpected change in weight.  Denies fatigue. No change in appetite.   Skin: Negative for rash or itching.  HEENT: Negative for congestion, sore throat, neck pain and neck stiffness. No change in vision or hearing.   Cardiovascular:  Negative for chest pain and palpitations.  No pedal edema.  Respiratory: Negative for cough, shortness of breath and wheezing.  Gastrointestinal: Negative for nausea and vomiting, abdominal pain, diarrhea and constipation.   Genitourinary: Negative for dysuria, frequency urgency, or burning on micturition. No difficulty with initiating urination. No vaginal discharge.  Hematological: Negative for bleeding or easy bruising.  Musculoskeletal: + Arthralgias. Negative for back pain or neck pain.  Neurological: Negative for dizziness, seizures or syncopal episodes. Denies headaches.   Endocrine: Denies excessive thirst.  No heat/cold intolerance.  Psychiatric: Negative for depression or insomnia.    PHYSICAL EXAMINATION:     VITALS: Temp: 97.51F, Pulse 80, Resp 16, BP 139/80, Wt: 170lbs, Ht 5'2''    GEN:  Well developed, well nourished 53 y.o. female in no acute distress.   SKIN: No rashes, ulcers, icterus, or other obvious lesions/laceration.   HEENT:  Normocephalic and atraumatic.Pupils equal, round and reactive to light and accommodation.  Sclerae are anicteric. Extraocular eye movements intact. Nares patent. Moist Mucous Membranes   NECK: Supple. FROM. No lymphadenopathy. Trachea is midline.   RESP: Clear to auscultation bilaterally. No wheezes, rales, rhonchi. Normal effort and breath sounds. No respiratory distress  CARDIO: Regular rate and rhythm. No MGR.   ABDOMEN: Soft, non-tender, non-distended, normoactive bowel sounds in all four quadrants.   EXTREMITIES: Right shoulder skin intact. No swelling. Full PROM glenohumeral abduction 0-90 degrees and ER 0-60 degrees with pain. + Impingement. No instability.    MOTOR: Intact   NEURO: Alert and oriented to person, place, and time.   PSYCH: Mood, memory, affect and judgment normal    RADIOGRAPHS & DIAGNOSTIC STUDIES:     MRI Right shoulder type II acromial morphology.     LABS  CBC: wbc 6.6, hgb/hct 16.1/49/3, plt 239   BMP: wnl   EKG: sinus rhythm     ASSESSMENT:     1. Right shoulder impingement syndrome     PLAN:     1. Right shoulder arthroscopic partial synovectomy and possible biceps tenodesis and possible acromioplasty     Due to the current findings, affected activity of daily living and continued pain and discomfort, surgical intervention is indicated. The alternatives, risks, and complications, including but not limited to infection, blood loss, need for blood transfusion, neurovascular damage, peri-incisional numbness, subcutaneous hematoma, bone fracture, anesthetic complications, DVT, PE, death, postoperative stiffness and pain, possible surgical scar, damage to blood vessels and nerves,  failure to relieve symptoms, worsening of current symptoms, need for more surgery, MI, and stroke, have been discussed. The patient understands and wishes to proceed with surgery.     The patient is advised to remain NPO following midnight prior to surgery. All of the patient's questions were answered to their satisfaction.       Anne Ng, PA-C   [facility]

## 2013-11-04 ENCOUNTER — Inpatient Hospital Stay: Payer: PRIVATE HEALTH INSURANCE

## 2013-11-04 MED ORDER — LIDOCAINE (PF) 10 MG/ML (1 %) IJ SOLN
10 mg/mL (1 %) | Freq: Once | INTRAMUSCULAR | Status: DC
Start: 2013-11-04 — End: 2013-11-04

## 2013-11-04 MED ADMIN — diphenhydrAMINE (BENADRYL) injection 12.5 mg: INTRAVENOUS | @ 14:00:00 | NDC 00641037621

## 2013-11-04 MED ADMIN — ropivacaine (PF) (NAROPIN) 5 mg/mL (0.5 %) injection 150 mg: INTRA_ARTICULAR | @ 11:00:00 | NDC 63323028635

## 2013-11-04 MED ADMIN — HYDROmorphone (PF) (DILAUDID) injection 0.4 mg: INTRAVENOUS | @ 13:00:00 | NDC 00409335601

## 2013-11-04 MED ADMIN — fentaNYL citrate (PF) injection 100 mcg: INTRAVENOUS | @ 11:00:00 | NDC 00409909422

## 2013-11-04 MED ADMIN — lactated ringers infusion: INTRAVENOUS | @ 11:00:00 | NDC 00409795309

## 2013-11-04 MED ADMIN — midazolam (VERSED) injection 2 mg: INTRAVENOUS | @ 11:00:00 | NDC 00641605701

## 2013-11-04 MED ADMIN — ceFAZolin (ANCEF) 1 g in 0.9% sodium chloride (MBP/ADV) 50 mL MBP: INTRAVENOUS | @ 12:00:00 | NDC 00781345170

## 2013-11-04 MED ADMIN — HYDROmorphone (PF) (DILAUDID) injection 0.4 mg: INTRAVENOUS | @ 14:00:00 | NDC 00409335601

## 2013-11-04 MED ADMIN — fentaNYL citrate (PF) injection 25 mcg: INTRAVENOUS | @ 14:00:00 | NDC 00641602401

## 2013-11-04 MED ADMIN — famotidine (PF) (PEPCID) injection 20 mg: INTRAVENOUS | @ 11:00:00 | NDC 00641602201

## 2013-11-04 MED ADMIN — ondansetron (ZOFRAN) injection 4 mg: INTRAVENOUS | @ 14:00:00 | NDC 55150012502

## 2013-11-04 MED FILL — FENTANYL CITRATE (PF) 50 MCG/ML IJ SOLN: 50 mcg/mL | INTRAMUSCULAR | Qty: 2

## 2013-11-04 MED FILL — EPINEPHRINE (PF) 1 MG/ML INJECTION: 1 mg/mL ( mL) | INTRAMUSCULAR | Qty: 1

## 2013-11-04 MED FILL — FAMOTIDINE (PF) 20 MG/2 ML IV: 20 mg/2 mL | INTRAVENOUS | Qty: 2

## 2013-11-04 MED FILL — LIDOCAINE HCL 1 % (10 MG/ML) IJ SOLN: 10 mg/mL (1 %) | INTRAMUSCULAR | Qty: 2

## 2013-11-04 MED FILL — BD POSIFLUSH NORMAL SALINE 0.9 % INJECTION SYRINGE: INTRAMUSCULAR | Qty: 10

## 2013-11-04 MED FILL — LACTATED RINGERS IV: INTRAVENOUS | Qty: 1000

## 2013-11-04 MED FILL — DILAUDID (PF) 2 MG/ML INJECTION SOLUTION: 2 mg/mL | INTRAMUSCULAR | Qty: 1

## 2013-11-04 MED FILL — CEFAZOLIN 1 GRAM SOLUTION FOR INJECTION: 1 gram | INTRAMUSCULAR | Qty: 1000

## 2013-11-04 MED FILL — NAROPIN (PF) 5 MG/ML (0.5 %) INJECTION SOLUTION: 5 mg/mL (0. %) | INTRAMUSCULAR | Qty: 30

## 2013-11-04 MED FILL — ONDANSETRON HCL 4 MG/2 ML INTRAVENOUS SYRINGE: 4 mg/2 mL | INTRAVENOUS | Qty: 2

## 2013-11-04 MED FILL — DIPHENHYDRAMINE HCL 50 MG/ML IJ SOLN: 50 mg/mL | INTRAMUSCULAR | Qty: 1

## 2013-11-04 MED FILL — LIDOCAINE (PF) 10 MG/ML (1 %) IJ SOLN: 10 mg/mL (1 %) | INTRAMUSCULAR | Qty: 2

## 2013-11-04 MED FILL — MIDAZOLAM 1 MG/ML IJ SOLN: 1 mg/mL | INTRAMUSCULAR | Qty: 2

## 2013-11-04 MED FILL — FENTANYL CITRATE (PF) 50 MCG/ML IJ SOLN: 50 mcg/mL | INTRAMUSCULAR | Qty: 5

## 2013-11-04 NOTE — Op Note (Signed)
[facility]                               [place], [place] [address]                                 OPERATIVE REPORT    PATIENT:     Cassandra Daniels, Cassandra Daniels  MRN:            [id number]      DATE:   11/04/2013  BILLING:     [id number]  ROOM:        [id number]  ATTENDING:   Richardo Hanks, MD  DICTATING:   Richardo Hanks, MD      PREOPERATIVE DIAGNOSIS: Impingement syndrome, right shoulder.    POSTOPERATIVE DIAGNOSIS: Impingement syndrome, partial rotator cuff tear,  synovitis, right shoulder.    PROCEDURES PERFORMED: Arthroscopy, arthroscopic rotator cuff repair,  arthroscopic acromioplasty, arthroscopic partial synovectomy,  right shoulder.    ESTIMATED BLOOD LOSS: None.    SPECIMENS REMOVED: None.    COMPLICATIONS: None.    FINDINGS: A 1 cm rotator cuff tear, a partial tear, about 70% of the  fibers, synovitis in the rotator interval.    BRIEF HISTORY: Patient has had significant amount of problems with the  right shoulder, no response to conservative treatment, was consented for  surgery after having discussed at length possible risks and complications  of surgery including infection, bleeding, recurrence of pain, among other  possible problems.    DESCRIPTION OF PROCEDURE: Patient was taken to the operating room, induced  under general endotracheal anesthesia by the Anesthesia staff, placed in  standard arthroscopy holder. The right arm prepped with DuraPrep solution  and draped as a free sterile field. Posterior portal was used as  arthroscopy portal, lateral portal was the work portal. Portals were made  with an 11 blade followed by blunt trocars. Once the arthroscope was in  place, the shoulder was inspected. The humeral head, glenoid, biceps tendon  and subscapularis were all intact. The labrum was intact. There was a rent  In the supraspinatous a total of 1 cm in the supraspinatus footprint that was tagged.  There  was synovitis present in the rotator interval and through this rent in the  rotator cuff, the shaver was passed through it and the synovitis was  debrided using a 4 mm shaver.    Attention was then placed to subacromial space, where prominent bursa was  debrided, very prominent on the surface of the acromion was found,  coracoacromial ligament was divided and an anterior inferior acromioplasty  performed from the lateral tip to the [facility] joint converting to type 1  acromion. A 1 cm tear was completed, the bony bed dusted off with a shaver,  percutaneously, a 5.5 corkscrew anchor was then placed at dead man's angle,  Mason-Allen sutures passed through rotator cuff tissue with a Scorpion and  tied. A stable construct achieved in this manner. Subcutaneous tissues were  closed with 3-0 Vicryl and the skin with 4-0 Monocryl. Sterile dressings  were applied. Patient tolerated procedure well, was taken to recovery room  without problems.  Richardo Hanks, MD    ELP:wmx  D: 11/04/2013 T: 11/04/2013 12:06 P  Job: [id number]  CScriptDoc #: [id number]  cc:   Cathren Laine, MD        Richardo Hanks, MD

## 2013-11-04 NOTE — Other (Signed)
Pt states abdominal pain now" 5"/10- "it was a 7" .  Pt now rating rt shoulder pain 2/10.

## 2013-11-04 NOTE — Other (Signed)
I have reviewed discharge and medication reconciliation instructions with the patient and spouse.  The patient and spouse verbalized understanding.

## 2013-11-04 NOTE — Brief Op Note (Signed)
BRIEF OPERATIVE NOTE    Date of Procedure: 11/04/2013   Preoperative Diagnosis: ADHESIVE CAPSULITIS OF SHOULDER  Postoperative Diagnosis: ADHESIVE CAPSULITIS OF SHOULDER    Procedure(s):  RIGHT SHOULDER ARTHROSCOPIC SYNOVECTOMY, Rotator cuff repair, and acromioplasty   Surgeon(s) and Role:     * Richardo Hanks, MD - Primary  Anesthesia: General   Estimated Blood Loss: < 10cc   Specimens: * No specimens in log *   Findings: Right shoulder rotator cuff tear, impingement and synovitis    Complications: None   Implants: * No implants in log *

## 2013-11-04 NOTE — Other (Signed)
Pt c/o "my chest hurts" , pt pushing with hand in middle -upper abdominal pain when asked to show where the pain is located.  Dr. Ronny Flurry made aware and came to bedside to evaluate pt's complaint.  Once Dr. Ronny Flurry at bedside pt also c/o headache.  Per MD continue with IV pain medication and re-evaluate after 10-15 min.

## 2013-11-04 NOTE — Other (Signed)
TRANSFER - IN REPORT:    Verbal report received from Reed Breech, RN(name) on Cassandra Daniels  being received from PACU(unit) for routine post - op      Report consisted of patient???s Situation, Background, Assessment and   Recommendations(SBAR).     Information from the following report(s) SBAR, Procedure Summary and MAR was reviewed with the receiving nurse.    Opportunity for questions and clarification was provided.      Assessment completed upon patient???s arrival to unit and care assumed.

## 2013-11-04 NOTE — Progress Notes (Signed)
Date of Surgery Update:  Cassandra Daniels was seen and examined.  History and physical has been reviewed. There have been no significant clinical changes since the completion of the originally dated History and Physical.    Signed By: Doralee Albino, [place]     November 04, 2013 7:34 AM

## 2013-11-04 NOTE — Other (Signed)
Blocks Rt. Brachial plexus block with ULTRASOUND GUIDANCE.    Operator: Dereck Leep, MD    Indication: Post-operative analgesia per surgeon's request  Time out performed, correct patient, side, site, and procedure verified.   Sedation: Midazolam 2mg , fentanyl 175mcg        Patient placed in  sitting position. Monitors/oxygen applied; right neck marked and prepped with Chlorhexidine     Target nerves identified by ultrasound and nerve stimulator, 30 ml's 0.5% Ropivacaine injected in divided doses with negative aspiration through 22 G, 2" needle.      Patient tolerated procedure well, vital signs stable throughout, with no apparent complications.    Visit Vitals   Item Reading   ??? Ht 5' 2.5" (1.588 m)   ??? Wt 77.111 kg (170 lb)   ??? BMI 30.58 kg/m2         Dereck Leep, MD  @DATE @  .7:14 AM

## 2013-11-04 NOTE — Progress Notes (Signed)
+  Post-Anesthesia Evaluation and Assessment    Patient: Cassandra Daniels MRN: [id number]  SSN: [id number]   Date of Birth: Jul 17, 1961  Age: 53 y.o.  Sex: female      Cardiovascular Function/Vital Signs  BP 123/76    Pulse 93    Temp(Src) 97.8 ??F (36.6 ??C)    Resp 18    Ht 5' 2.5" (1.588 m)    Wt 80.003 kg (176 lb 6 oz)    BMI 31.73 kg/m2      SpO2 94%     Patient is status post Procedure(s):  RIGHT SHOULDER ARTHROSCOPIC SYNOVECTOMY POSSIBLE BICEPS TENODESIS.    Nausea/Vomiting: Controlled.    Postoperative hydration reviewed and adequate.    Pain:  Pain Scale 1: Numeric (0 - 10) (11/04/13 0948)  Pain Intensity 1: 2 (11/04/13 0948)   Managed.    Neurological Status:   Neuro (WDL): Within Defined Limits (11/04/13 0908)   At baseline.    Mental Status and Level of Consciousness: Arousable.    Pulmonary Status:   O2 Device: Room air (11/04/13 0948)   Adequate oxygenation and airway patent.    Complications related to anesthesia: None    Post-anesthesia assessment completed. No concerns.    Signed By: Daryl Eastern, MD    November 04, 2013

## 2013-11-04 NOTE — Other (Signed)
Dilaudid 1mg  given and 1mg  wasted.  Error in Pyxis when attempting to waste shows 0mg  given 2 mg wasted.

## 2013-11-04 NOTE — Op Note (Signed)
[facility]                               [place], [place] [address]                                 OPERATIVE REPORT    PATIENT:     Cassandra Daniels, Cassandra Daniels  MRN:            [id number]      DATE:   11/04/2013  BILLING:     [id number]  ROOM:        [id number]  ATTENDING:   Richardo Hanks, MD  DICTATING:   Richardo Hanks, MD      PREOPERATIVE DIAGNOSIS: Impingement syndrome, right shoulder.    POSTOPERATIVE DIAGNOSIS: Impingement syndrome, partial rotator cuff tear,  synovitis, right shoulder.    PROCEDURES PERFORMED: Arthroscopy, arthroscopic rotator cuff repair,  arthroscopic acromioplasty, arthroscopic partial synovectomy,  right shoulder.    ESTIMATED BLOOD LOSS: None.    SPECIMENS REMOVED: None.    COMPLICATIONS: None.    FINDINGS: A 1 cm rotator cuff tear, a partial tear, about 70% of the  fibers, synovitis in the rotator interval.    BRIEF HISTORY: Patient has had significant amount of problems with the  right shoulder, no response to conservative treatment, was consented for  surgery after having discussed at length possible risks and complications  of surgery including infection, bleeding, recurrence of pain, among other  possible problems.    DESCRIPTION OF PROCEDURE: Patient was taken to the operating room, induced  under general endotracheal anesthesia by the Anesthesia staff, placed in  standard arthroscopy holder. The right arm prepped with DuraPrep solution  and draped as a free sterile field. Posterior portal was used as  arthroscopy portal, lateral portal was the work portal. Portals were made  with an 11 blade followed by blunt trocars. Once the arthroscope was in  place, the shoulder was inspected. The humeral head, glenoid, biceps tendon  and subscapularis were all intact. The labrum was intact. There was a rent  In the supraspinatous a total of 1 cm in the supraspinatus footprint that was tagged. There   was synovitis present in the rotator interval and through this rent in the  rotator cuff, the shaver was passed through it and the synovitis was  debrided using a 4 mm shaver.    Attention was then placed to subacromial space, where prominent bursa was  debrided, very prominent on the surface of the acromion was found,  coracoacromial ligament was divided and an anterior inferior acromioplasty  performed from the lateral tip to the [facility] joint converting to type 1  acromion. A 1 cm tear was completed, the bony bed dusted off with a shaver,  percutaneously, a 5.5 corkscrew anchor was then placed at dead man's angle,  Mason-Allen sutures passed through rotator cuff tissue with a Scorpion and  tied. A stable construct achieved in this manner. Subcutaneous tissues were  closed with 3-0 Vicryl and the skin with 4-0 Monocryl. Sterile dressings  were applied. Patient tolerated procedure well, was taken to recovery room  without problems.  Richardo Hanks, MD    ELP:wmx  D: 11/04/2013 T: 11/04/2013 12:06 P  Job: [id number]  CScriptDoc #: [id number]  cc:   Cathren Laine, MD        Richardo Hanks, MD

## 2013-11-05 MED FILL — QUELICIN 20 MG/ML INJECTION SOLUTION: 20 mg/mL | INTRAMUSCULAR | Qty: 10

## 2013-11-05 MED FILL — LACTATED RINGERS IV: INTRAVENOUS | Qty: 1000

## 2013-11-05 MED FILL — NEOSTIGMINE METHYLSULFATE 1 MG/ML INJECTION: 1 mg/mL | INTRAMUSCULAR | Qty: 10

## 2013-11-05 MED FILL — ONDANSETRON (PF) 4 MG/2 ML INJECTION: 4 mg/2 mL | INTRAMUSCULAR | Qty: 2

## 2013-11-05 MED FILL — LIDOCAINE (PF) 20 MG/ML (2 %) IJ SOLN: 20 mg/mL (2 %) | INTRAMUSCULAR | Qty: 5

## 2013-11-05 MED FILL — DEXAMETHASONE SODIUM PHOSPHATE 4 MG/ML IJ SOLN: 4 mg/mL | INTRAMUSCULAR | Qty: 1

## 2013-11-05 MED FILL — GLYCOPYRROLATE 0.2 MG/ML IJ SOLN: 0.2 mg/mL | INTRAMUSCULAR | Qty: 2

## 2013-11-05 MED FILL — DIPRIVAN 10 MG/ML INTRAVENOUS EMULSION: 10 mg/mL | INTRAVENOUS | Qty: 20

## 2013-11-05 MED FILL — ROCURONIUM 10 MG/ML IV: 10 mg/mL | INTRAVENOUS | Qty: 5

## 2013-11-07 NOTE — Progress Notes (Signed)
PT DAILY TREATMENT NOTE 8-14    Patient Name: Cassandra Daniels  Date:11/07/2013  DOB: April 07, 1961  [x]   Patient DOB Verified  Payor: OPTIMA / Plan: 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]    SUBJECTIVE  Pain Level (0-10 scale): 7  Any medication changes, allergies to medications, adverse drug reactions, diagnosis change, or new procedure performed?: [x]  No    []  Yes (see summary sheet for update)  Subjective functional status/changes:   []  No changes reported  Severe pain in right shoulder post right partial supraspinatus repair    OBJECTIVE  23 min Manual Therapy:  Per flow sheet   Rationale: decrease pain, increase ROM and increase tissue extensibility to improve GHJ mobility   23 min Therapeutic Exercise:  [x]  See flow sheet :   Rationale: increase ROM to improve the patient???s ability to self manage pain control            With   []  TE   []  TA   []  neuro   []  other: Patient Education: [x]  Review HEP    []  Progressed/Changed HEP based on:   []  positioning   []  body mechanics   []  transfers   []  heat/ice application    []  other:      Other Objective/Functional Measures: refer to eval     Pain Level (0-10 scale) post treatment: 4    ASSESSMENT/Changes in Function: poor tolerance to passive shoulder flexion and Elbow flexion    Patient will continue to benefit from skilled PT services to modify and progress therapeutic interventions, address functional mobility deficits, address ROM deficits, address strength deficits, analyze and address soft tissue restrictions, analyze and cue movement patterns, analyze and modify body mechanics/ergonomics and assess and modify postural abnormalities to attain remaining goals.     [x]   See Plan of Care  []   See progress note/recertification  []   See Discharge Summary         Progress towards goals :  Short Term Goals: To be accomplished in 2   weeks:  1. Pt will be compliant with HEP at least 2x/day to improve tolerance to PROM  2. Pt will allow therapist to passively flex right shoulder to 75 degrees to prevent the detrimental effects of immobilization  3. Pt will actively flex right elbow to 110 degrees to improve tolerance to manual therapy    Long Term Goals: To be accomplished in 4  weeks:  1. Pt will improve FOTO 15 points to increase ease of ADLs  2. Pt will improve passive right shoulder flexion to 110 degrees to improve pain control with ADLs  3. Pt will increase passive right shoulder flexion to 145 degrees to minimize pain with ADLs        PLAN  []   Upgrade activities as tolerated     [x]   Continue plan of care  []   Update interventions per flow sheet       []   Discharge due to:_  []   Other:_      Glenetta Borg, PT 11/07/2013  12:07 PM

## 2013-11-07 NOTE — Progress Notes (Signed)
[facility] ??? 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  Had difficulty with active flexion and abduction prior to surgery secondary to severe pain     The Plan of Care and following information is based on the information from the initial evaluation.  Assessment/ key information: Pt presents s/p partial right supraspinatus repair secondary to severe right shoulder pain with AROM.  Continue PT to prevent the detrimental effects of immobilization in preparation for AROM and strengthening as medically appropriate to restore functional (I).    Problem List: pain affecting function, decrease ROM, decrease strength, decrease ADL/ functional abilitiies, decrease activity tolerance and decrease flexibility/ joint mobility   Treatment Plan may include any combination of the following: Therapeutic exercise, Therapeutic activities, Neuromuscular re-education, Physical agent/modality, Manual therapy and Patient education  Patient / Family readiness to learn indicated by: asking questions, trying to perform skills and interest  Persons(s) to be included in education: patient (P)  Barriers to Learning/Limitations: no  Patient Goal (s): ???To regain movement and strength in my arm???  Patient Self Reported Health Status: good  Rehabilitation Potential: fair    Short Term Goals: To be accomplished in 2  weeks:  1.  Pt will be compliant with HEP at least  2x/day to improve tolerance to PROM  2.  Pt will allow therapist to passively flex right shoulder to 75 degrees to prevent the detrimental effects of immobilization  3.  Pt will actively flex right elbow to 110 degrees to improve tolerance to manual therapy    Long Term Goals: To be accomplished in 4  weeks:  1.  Pt will improve FOTO 15 points to increase ease of ADLs  2.  Pt will improve passive right shoulder flexion to 110 degrees to improve pain control with ADLs  3.  Pt will increase passive right shoulder flexion to 145 degrees to minimize pain with ADLs    Frequency / Duration: Patient to be seen 3 times per week for 4  weeks.    Patient/ Caregiver education and instruction: self care, activity modification, brace/ splint application and exercises   [x]   Plan of care has been reviewed with PTA    Glenetta Borg, PT 11/07/2013 12:02 PM    ________________________________________________________________________    I certify that the above Therapy Services are being furnished while the patient is under my care. I agree with the treatment plan and certify that this therapy is necessary.    Physician's Signature:____________________  Date:____________Time: _________    Please sign and return to [facility] ??? [facility]  [place] [place] [address]  [phone]  [phone] fax

## 2013-11-09 NOTE — Progress Notes (Signed)
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  Pt reports that she avoided doing passive flexion due to pain    OBJECTIVE    23 min Manual Therapy:  Per  Flow sheet   Rationale: decrease pain, increase ROM and increase tissue extensibility to improve PROM in all planes   12 min Therapeutic Exercise:  [x]  See flow sheet :   Rationale: increase ROM and increase strength to improve the patient???s ability to increase ease of ADLs          With   []  TE   []  TA   []  neuro   []  other: Patient Education: [x]  Review HEP    []  Progressed/Changed HEP based on:   []  positioning   []  body mechanics   []  transfers   []  heat/ice application    []  other:      Other Objective/Functional Measures: Pt was very augmentative about working on passive flexion - refusing to allow therapist to move more than 20 degrees when there was an empty end feel.  Therapist did not let up, only agreeing to hold position until pain improved then easing up a few degrees at a time.  Pt continued to resist so therapist explained that she was not doing her any favors by not working on PROM as instructed by MD considering the minor surgery she had.  Therapist reviewed all the detrimental effects of not working thru pain in the first 2 weeks.  Eventually, once therapist worked past the barrier point, passive flexion increased to 90 degrees with pt reporting nearly no  pain.  Therapist again reinforced the importance of trust in our training and expertise to maximize outcomes.  Pt appeared more agreeable to work thru pain and did not act displeased post treatment.       Pain Level (0-10 scale) post treatment: 5-6    ASSESSMENT/Changes in Function: improved tolerance to PROM post extensive patient counseling.      Patient will continue to benefit from skilled PT services to modify and progress therapeutic interventions, address functional mobility deficits, address ROM deficits, address strength deficits, analyze and address soft tissue restrictions and analyze and cue movement patterns to attain remaining goals.     []   See Plan of Care  []   See progress note/recertification  []   See Discharge Summary         Progress towards goals:  Short Term Goals: To be accomplished in 2 weeks:  1. Pt will be compliant with HEP at least 2x/day to improve tolerance to PROM  2. Pt will allow therapist to passively flex right shoulder to 75 degrees to prevent the detrimental effects of immobilization  3. Pt will actively flex right elbow to 110 degrees to improve tolerance to manual therapy    Long Term Goals: To be  accomplished in 4 weeks:  1. Pt will improve FOTO 15 points to increase ease of ADLs  2. Pt will improve passive right shoulder flexion to 110 degrees to improve pain control with ADLs  3. Pt will increase passive right shoulder flexion to 145 degrees to minimize pain with ADLs    PLAN  []   Upgrade activities as tolerated     [x]   Continue plan of care  []   Update interventions per flow sheet       []   Discharge due to:_  []   Other:_      Glenetta Borg, PT 11/09/2013  11:41 AM

## 2013-11-12 NOTE — Progress Notes (Signed)
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]    SUBJECTIVE  Pain Level (0-10 scale): 4  Any medication changes, allergies to medications, adverse drug reactions, diagnosis change, or new procedure performed?: '[x]'  No    '[]'  Yes (see summary sheet for update)  Subjective functional status/changes:   '[]'  No changes reported  I think it still really hurts but it is moving better    OBJECTIVE  10 min Manual Therapy:  Per flow sheet   Rationale: decrease pain, increase ROM and increase tissue extensibility to improve pain control   25 min Therapeutic Exercise:  '[x]'  See flow sheet :   Rationale: increase ROM and increase strength to improve the patient???s ability to improve pain management   Modality rationale: decrease edema, decrease inflammation and decrease pain to improve the patient???s ability to increase ease of ADLs   Min Type Additional Details    '[]'  Estim: '[]' Att   '[]' Unatt        '[]' TENS instruct                  '[]' IFC  '[]' Premod   '[]' NMES                     '[]' Other:  '[]' w/US   '[]' w/ice   '[]' w/heat  Position:  Location:    '[]'   Traction: '[]'  Cervical       '[]' Lumbar                       '[]'  Prone          '[]' Supine                       '[]' Intermittent   '[]' Continuous Lbs:  '[]'  before manual  '[]'  after manual    '[]'   Ultrasound: '[]' Continuous   '[]'  Pulsed                           '[]' 1MHz   '[]' 3MHz Location:  W/cm2:    '[]'   Iontophoresis with dexamethasone         Location: '[]'  Take home patch   '[]'  In clinic   10 '[x]'   Ice     '[]'   heat  '[]'   Ice massage Position:seated to right shoulderLocation:    '[]'   Laser  '[]'   Other: Position:  Location:    '[]'   Vasopneumatic Device Pressure:       '[]'  lo '[]'  med '[]'  hi   Temperature: '[]'  lo '[]'  med '[]'  hi   '[x]'  Skin assessment  post-treatment:  '[x]' intact '[]' redness- no adverse reaction    '[]' redness ??? adverse reaction:               With   '[]'  TE   '[]'  TA   '[]'  neuro   '[]'  other: Patient Education: '[x]'  Review HEP    '[]'  Progressed/Changed HEP based on:   '[]'  positioning   '[]'  body mechanics   '[]'  transfers   '[]'  heat/ice application    '[]'  other:      Other Objective/Functional Measures: Improved tolerance  to PROM and increased ROM noted.  Refer to progress report     Pain Level (0-10 scale) post treatment: 4    ASSESSMENT/Changes in Function: improved PROM and tolerance to treatment    Patient will continue to benefit from skilled PT services to modify and progress therapeutic interventions, address functional mobility deficits, address ROM deficits, address strength deficits, analyze and address soft tissue restrictions, analyze and cue movement patterns, analyze and modify body mechanics/ergonomics and assess and modify postural abnormalities to attain remaining goals.     '[]'   See Plan of Care  '[x]'   See progress note/recertification  '[]'   See Discharge Summary         Progress towards goals :  Short Term Goals: To be accomplished in 2 weeks:  1. Pt will be compliant with HEP at least 2x/day to improve tolerance to PROM  Met per patient report 11/12/2013  2. Pt will allow therapist to passively flex right shoulder to 75 degrees to prevent the detrimental effects of immobilization  Met - allowed 110 degrees after relaxation techniques  11/12/2013  3. Pt will actively flex right elbow to 110 degrees to improve tolerance to manual therapy    Long Term Goals: To be accomplished in 4 weeks:  1. Pt will improve FOTO 15 points to increase ease of ADLs  2. Pt will improve passive right shoulder flexion to 110 degrees to improve pain control with ADLs  3. Pt will increase passive right shoulder flexion to 145 degrees to minimize pain with ADLs      PLAN  '[]'   Upgrade activities as tolerated     '[x]'   Continue plan of care  '[]'   Update interventions per flow sheet        '[]'   Discharge due to:_  '[]'   Other:_      Glenetta Borg, PT 11/12/2013  11:06 AM

## 2013-11-12 NOTE — Progress Notes (Signed)
[facility] ??? 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 Had difficulty with active flexion and abduction prior to surgery secondary to severe pain    Visits from Start of Care: 3    Missed Visits: 0      Key Functional Changes: Pt has progressed well since second visit allowing therapist to progressively increase PROM.  PROM at visit 3 measured: ER 65, abduction 95 and flexion 110. Pt still demonstrates poor pain control and an empty end feel with passive mobility.      Updated Goals: to be achieved in 4 weeks:   Continue working towards LTGs    ASSESSMENT/RECOMMENDATIONS:  [x] Continue therapy per initial plan/protocol at a frequency of  3 x per week for 4 weeks  [] Continue therapy with the following recommended changes:_____________________      _____________________________________________________________________  [] Discontinue therapy progressing towards or have reached established goals  [] Discontinue therapy due to lack of appreciable progress towards goals  [] Discontinue therapy due to lack of attendance or compliance  [] Await Physician's recommendations/decisions regarding therapy  [] Other:________________________________________________________________    Thank you for this referral.    Glenetta Borg, PT 11/12/2013 10:58 AM  NOTE TO PHYSICIAN:  PLEASE COMPLETE THE ORDERS BELOW AND   FAX TO [facility]: [phone]  If you are unable to process  this request in 24 hours please contact our office: [phone]    ? I have read the above report and request that my patient continue as recommended.  ? I have read the above report and request that my patient continue therapy with the following changes/special instructions:__________________________________________________________  ? I have read the above report and request that my patient be discharged from therapy.    Physician???s signature: ______________________________Date: ______Time:______

## 2013-11-14 NOTE — Progress Notes (Signed)
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   Min Type Additional Details    _4  Estim: _5 Att   _6 Unatt        _7 TENS instruct                  _8 IFC  _9 Premod   _10 NMES                     _11 Other:  _12 w/US   _13 w/ice   _14 w/heat  Position:  Location:    _15   Traction: _16  Cervical       _17 Lumbar                       _18  Prone          _19 Supine                       _20 Intermittent   _21 Continuous Lbs:  _22  before manual  _23  after manual    _24   Ultrasound: _25 Continuous   _26  Pulsed                           _27 1MHz   _28 3MHz Location:  W/cm2:    _29   Iontophoresis with dexamethasone         Location: _30  Take home patch   _31  In clinic    _32   Ice     _33   heat  _34   Ice massage Position:  Location:    _35   Laser  _36   Other: Position:  Location:   10 _37   Vasopneumatic Device Pressure:       _38  lo _39  med _40  hi   Temperature: _41  lo _42  med _43  hi   _44  Skin assessment post-treatment:  _45 intact _46 redness- no adverse reaction    _47 redness ??? adverse reaction:     14 min Therapeutic Exercise:  _48  See flow sheet :   Rationale: increase ROM and increase strength to improve the patient???s ability to perform ADL's.    12 min Manual Therapy:  (R) ST/GH joint mobs, PROM.   Rationale: decrease pain, increase ROM and  increase tissue extensibility to improve activity tolerance.     min Gait Training:  ___ feet with ___ device on level surfaces with ___ level of assist   Rationale:          With   _49  TE   _50  TA   _51  neuro   _52  other: Patient Education: _53  Review HEP    _54  Progressed/Changed HEP based on:   _55  positioning   _56  body mechanics   _57  transfers   _58   heat/ice application    <[id number]>_0<\/[id number]>_3  other:      Other Objective/Functional Measures: Poor tolerance with manual PROM. Pt reported exercises and manual increased shoulder pain.     Pain Level (0-10 scale) post treatment: 6/10    ASSESSMENT/Changes in Function:     Patient will continue to benefit from skilled PT services to modify and progress therapeutic interventions, address functional mobility deficits, address ROM deficits, address strength deficits, analyze and address soft tissue restrictions and analyze and cue movement patterns to attain remaining goals.     _1   See Plan of Care  _2   See progress note/recertification  <[id number]>_7<\/[id number]>_1   See Discharge Summary         Progress towards goals / Updated goals:  Short Term Goals: To be accomplished in 2 weeks:  1. Pt will be compliant with HEP at least 2x/day to improve tolerance to PROM Met per patient report 11/12/2013  2. Pt will allow therapist to passively flex right shoulder to 75 degrees to prevent the detrimental effects of immobilization Met - allowed 110 degrees after relaxation techniques 11/12/2013  3. Pt will actively flex right elbow to 110 degrees to improve tolerance to manual therapy  Long Term Goals: To be accomplished in 4 weeks:  1. Pt will improve FOTO 15 points to increase ease of ADLs  2. Pt will improve passive right shoulder flexion to 110 degrees to improve pain control with ADLs  3. Pt will increase passive right shoulder flexion to 145 degrees to minimize pain with ADLs    PLAN  _4   Upgrade activities as tolerated     _5   Continue plan of care  _6   Update interventions per flow sheet       _7   Discharge due to:_  _8    Other:_      Lestine Box, PTA 11/14/2013  4:52 PM

## 2013-11-16 NOTE — Progress Notes (Signed)
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]    SUBJECTIVE  Pain Level (0-10 scale): 5  Any medication changes, allergies to medications, adverse drug reactions, diagnosis change, or new procedure performed?: '[x]'  No    '[]'  Yes (see summary sheet for update)  Subjective functional status/changes:   '[x]'  No changes reported      OBJECTIVE    Modality rationale: decrease pain to improve the patient???s ability to tolerate ADLs   Min Type Additional Details    '[]'  Estim: '[]' Att   '[]' Unatt        '[]' TENS instruct                  '[]' IFC  '[]' Premod   '[]' NMES                     '[]' Other:  '[]' w/US   '[]' w/ice   '[]' w/heat  Position:  Location:    '[]'   Traction: '[]'  Cervical       '[]' Lumbar                       '[]'  Prone          '[]' Supine                       '[]' Intermittent   '[]' Continuous Lbs:  '[]'  before manual  '[]'  after manual    '[]'   Ultrasound: '[]' Continuous   '[]'  Pulsed                           '[]' 1MHz   '[]' 3MHz Location:  W/cm2:    '[]'   Iontophoresis with dexamethasone         Location: '[]'  Take home patch   '[]'  In clinic   10 '[x]'   Ice     '[]'   heat  '[]'   Ice massage Position:  Location:(R) shld    '[]'   Laser  '[]'   Other: Position:  Location:    '[]'   Vasopneumatic Device Pressure:       '[]'  lo '[]'  med '[]'  hi   Temperature: '[]'  lo '[]'  med '[]'  hi   '[x]'  Skin assessment post-treatment:  '[x]' intact '[]' redness- no adverse reaction    '[]' redness ??? adverse reaction:     23 min Therapeutic Exercise:  '[x]'  See flow sheet :   Rationale: increase ROM, increase strength and stability to improve the patient???s ability to tolerate ADLs      min Therapeutic Activity:  '[]'   See flow sheet :   Rationale:   to improve the patient???s ability to       min Neuromuscular Re-education:  '[]'   See flow sheet :    Rationale:   to improve the patient???s ability to     15 min Manual Therapy:  STJ/PROM (R) shld   Rationale:  to decrease pain, increase ROM and increase tissue extensibility to improve activity tolerance.     min Gait Training:  ___ feet with ___ device on level surfaces with ___ level of assist   Rationale:  With   '[]'  TE   '[]'  TA   '[]'  neuro   '[]'  other: Patient Education: '[x]'  Review HEP    '[]'  Progressed/Changed HEP based on:   '[]'  positioning   '[]'  body mechanics   '[]'  transfers   '[]'  heat/ice application    '[]'  other:      Other Objective/Functional Measures: Noted mm guarding during PROM.  Pt perseverated about medications throughout PROM.     Pain Level (0-10 scale) post treatment: 4    ASSESSMENT/Changes in Function:     Patient will continue to benefit from skilled PT services to address functional mobility deficits, address ROM deficits and address strength deficits to attain remaining goals.     '[x]'   See Plan of Care  '[]'   See progress note/recertification  '[]'   See Discharge Summary         Progress towards goals / Updated goals:  Short Term Goals: To be accomplished in 2 weeks:  1. Pt will be compliant with HEP at least 2x/day to improve tolerance to PROM Met per patient report 11/12/2013  2. Pt will allow therapist to passively flex right shoulder to 75 degrees to prevent the detrimental effects of immobilization Met - allowed 110 degrees after relaxation techniques 11/12/2013  3. Pt will actively flex right elbow to 110 degrees to improve tolerance to manual therapy  Long Term Goals: To be accomplished in 4 weeks:  1. Pt will improve FOTO 15 points to increase ease of ADLs  2. Pt will improve passive right shoulder flexion to 110 degrees to improve pain control with ADLs  3. Pt will increase passive right shoulder flexion to 145 degrees to minimize pain with ADLs    PLAN  '[]'   Upgrade activities as tolerated     '[x]'   Continue plan of care  '[]'   Update interventions per flow sheet       '[]'   Discharge due to:_   '[]'   Other:_      Don Perking, PTA 11/16/2013  3:41 PM

## 2013-11-18 NOTE — Progress Notes (Signed)
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]    SUBJECTIVE  Pain Level (0-10 scale): 3  Any medication changes, allergies to medications, adverse drug reactions, diagnosis change, or new procedure performed?: '[x]'  No    '[]'  Yes (see summary sheet for update)  Subjective functional status/changes:   '[]'  No changes reported  I'm very achy today    OBJECTIVE    24 min Therapeutic Exercise:  '[x]'  See flow sheet :   Rationale: increase ROM and increase strength to improve the patient???s ability to increase ease of ADLs   10 min Manual Therapy:  Per flow sheet   Rationale: decrease pain, increase ROM, increase tissue extensibility and decrease trigger points to increase ease of ADLs3          With   '[]'  TE   '[]'  TA   '[]'  neuro   '[]'  other: Patient Education: '[x]'  Review HEP    '[]'  Progressed/Changed HEP based on:   '[]'  positioning   '[]'  body mechanics   '[]'  transfers   '[]'  heat/ice application    '[]'  other:      Other Objective/Functional Measures: Improved PROM: abduction 100, scaption 110, ER 75      Pain Level (0-10 scale) post treatment: 3    ASSESSMENT/Changes in Function: no increase in pain post manual - continues to demonstrate poor tolerance but is more compliant    Patient will continue to benefit from skilled PT services to modify and progress therapeutic interventions, address functional mobility deficits, address ROM deficits, address strength deficits, analyze and address soft tissue restrictions, analyze and cue movement patterns and analyze and modify body mechanics/ergonomics to attain remaining goals.     '[]'   See Plan of Care  '[]'   See progress note/recertification  '[]'   See Discharge Summary         Progress towards goals / Updated goals:  Short  Term Goals: To be accomplished in 2 weeks:  1. Pt will be compliant with HEP at least 2x/day to improve tolerance to PROM Met per patient report 11/12/2013  2. Pt will allow therapist to passively flex right shoulder to 75 degrees to prevent the detrimental effects of immobilization Met - allowed 110 degrees after relaxation techniques 11/12/2013  3. Pt will actively flex right elbow to 110 degrees to improve tolerance to manual therapy  Met  -elbow flexion 130 degrees 11/18/2013  Long Term Goals: To be accomplished in 4 weeks:  1. Pt will improve FOTO 15 points to increase ease of ADLs  2. Pt will improve passive right shoulder flexion to 110 degrees to improve pain control with ADLs  3. Pt will increase passive right shoulder flexion to 145 degrees to minimize pain with ADLs    PLAN  '[]'   Upgrade activities as tolerated     '[x]'   Continue plan of care  '[]'   Update interventions per flow sheet       '[]'   Discharge due to:_  '[]'   Other:_      Glenetta Borg, PT 11/18/2013  3:42 PM

## 2013-11-22 NOTE — Progress Notes (Signed)
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]    SUBJECTIVE  Pain Level (0-10 scale): 2/10  Any medication changes, allergies to medications, adverse drug reactions, diagnosis change, or new procedure performed?: _1  No    _2  Yes (see summary sheet for update)  Subjective functional status/changes:   _3  No changes reported  "It stays sore."    OBJECTIVE    Modality rationale: PD   Min Type Additional Details    _4  Estim: _5 Att   _6 Unatt        _7 TENS instruct                  _8 IFC  _9 Premod   _10 NMES                     _11 Other:  _12 w/US   _13 w/ice   _14 w/heat  Position:  Location:    _15   Traction: _16  Cervical       _17 Lumbar                       _18  Prone          _19 Supine                       _20 Intermittent   _21 Continuous Lbs:  _22  before manual  _23  after manual    _24   Ultrasound: _25 Continuous   _26  Pulsed                           _27 1MHz   _28 3MHz Location:  W/cm2:    _29   Iontophoresis with dexamethasone         Location: _30  Take home patch   _31  In clinic    _32   Ice     _33   heat  _34   Ice massage Position:  Location:    _35   Laser  _36   Other: Position:  Location:    _37   Vasopneumatic Device Pressure:       _38  lo _39  med _40  hi   Temperature: _41  lo _42  med _43  hi   _44  Skin assessment post-treatment:  _45 intact _46 redness- no adverse reaction    _47 redness ??? adverse reaction:     24 min Therapeutic Exercise:  _48  See flow sheet :   Rationale: increase ROM and increase strength to improve the patient???s ability to perform ADL's.    12 min Manual Therapy:  ST/GH joint mobs, PROM, STM/DTM (R) UT, lev scap.   Rationale: decrease pain, increase ROM, increase tissue extensibility and decrease trigger points to improve activity tolerance.     min Gait Training:   ___ feet with ___ device on level surfaces with ___ level of assist   Rationale:          With   _49  TE   _50  TA   _51  neuro   _52  other: Patient Education: _53  Review HEP    _54  Progressed/Changed HEP based on:   _55  positioning   _56  body mechanics   _57  transfers   <VOHYWVPXTGGYIRSW>_5<\/IOEVOJJKKXFGHWEX>_93  heat/ice application    <ZJIRCVELFYBOFBPZ>_0<\/CHENIDPOEUMPNTIR>_44  other:  Other Objective/Functional Measures: Performed exercises well per flow sheet.     Pain Level (0-10 scale) post treatment: 4/10    ASSESSMENT/Changes in Function:     Patient will continue to benefit from skilled PT services to modify and progress therapeutic interventions, address functional mobility deficits, address ROM deficits, address strength deficits and analyze and address soft tissue restrictions to attain remaining goals.     _0   See Plan of Care  _1   See progress note/recertification  <DGUYQIHKVQQVZDGL>_8<\/VFIEPPIRJJOACZYS>_0   See Discharge Summary         Progress towards goals / Updated goals:  Short Term Goals: To be accomplished in 2 weeks:  1. Pt will be compliant with HEP at least 2x/day to improve tolerance to PROM Met per patient report 11/12/2013  2. Pt will allow therapist to passively flex right shoulder to 75 degrees to prevent the detrimental effects of immobilization Met - allowed 110 degrees after relaxation techniques 11/12/2013  3. Pt will actively flex right elbow to 110 degrees to improve tolerance to manual therapy Met -elbow flexion 130 degrees 11/18/2013  Long Term Goals: To be accomplished in 4 weeks:  1. Pt will improve FOTO 15 points to increase ease of ADLs  2. Pt will improve passive right shoulder flexion to 110 degrees to improve pain control with ADLs  3. Pt will increase passive right shoulder flexion to 145 degrees to minimize pain with ADLs    PLAN  _3   Upgrade activities as tolerated     _4   Continue plan of care  _5   Update interventions per flow sheet       _6   Discharge due to:_  _7   Other:_      Lestine Box, PTA 11/22/2013  3:16 PM

## 2013-11-23 NOTE — Progress Notes (Signed)
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]    SUBJECTIVE  Pain Level (0-10 scale): 3/10  Any medication changes, allergies to medications, adverse drug reactions, diagnosis change, or new procedure performed?: '[x]'  No    '[]'  Yes (see summary sheet for update)  Subjective functional status/changes:   '[x]'  No changes reported    OBJECTIVE    Modality rationale: PD   Min Type Additional Details    '[]'  Estim: '[]' Att   '[]' Unatt        '[]' TENS instruct                  '[]' IFC  '[]' Premod   '[]' NMES                     '[]' Other:  '[]' w/US   '[]' w/ice   '[]' w/heat  Position:  Location:    '[]'   Traction: '[]'  Cervical       '[]' Lumbar                       '[]'  Prone          '[]' Supine                       '[]' Intermittent   '[]' Continuous Lbs:  '[]'  before manual  '[]'  after manual    '[]'   Ultrasound: '[]' Continuous   '[]'  Pulsed                           '[]' 1MHz   '[]' 3MHz Location:  W/cm2:    '[]'   Iontophoresis with dexamethasone         Location: '[]'  Take home patch   '[]'  In clinic    '[]'   Ice     '[]'   heat  '[]'   Ice massage Position:  Location:    '[]'   Laser  '[]'   Other: Position:  Location:    '[]'   Vasopneumatic Device Pressure:       '[]'  lo '[]'  med '[]'  hi   Temperature: '[]'  lo '[]'  med '[]'  hi   '[]'  Skin assessment post-treatment:  '[]' intact '[]' redness- no adverse reaction    '[]' redness ??? adverse reaction:     18 min Therapeutic Exercise:  '[x]'  See flow sheet :   Rationale: increase ROM and increase strength to improve the patient???s ability to perform ADL's.    12 min Manual Therapy:  ST/GH joint mobs, PROM, STM/DTM (R) UT, lev scap, subscap.   Rationale: decrease pain, increase ROM, increase tissue extensibility and decrease trigger points to improve activity tolerance once cleared for AROM.     min  Gait Training:  ___ feet with ___ device on level surfaces with ___ level of assist   Rationale:          With   '[x]'  TE   '[]'  TA   '[]'  neuro   '[]'  other: Patient Education: '[x]'  Review HEP    '[]'  Progressed/Changed HEP based on:   '[]'  positioning   '[]'  body mechanics   '[]'  transfers   '[]'  heat/ice application    '[]'  other:  Other Objective/Functional Measures: Tolerated exercises well per flow sheet. PROM slowly progressing, mm spasms present with PROM.     Pain Level (0-10 scale) post treatment: 5/10    ASSESSMENT/Changes in Function:     Patient will continue to benefit from skilled PT services to modify and progress therapeutic interventions, address functional mobility deficits, address ROM deficits, address strength deficits and analyze and address soft tissue restrictions to attain remaining goals.     '[x]'   See Plan of Care  '[]'   See progress note/recertification  '[]'   See Discharge Summary         Progress towards goals / Updated goals:  Short Term Goals: To be accomplished in 2 weeks:  1. Pt will be compliant with HEP at least 2x/day to improve tolerance to PROM Met per patient report 11/12/2013  2. Pt will allow therapist to passively flex right shoulder to 75 degrees to prevent the detrimental effects of immobilization Met - allowed 110 degrees after relaxation techniques 11/12/2013  3. Pt will actively flex right elbow to 110 degrees to improve tolerance to manual therapy Met -elbow flexion 130 degrees 11/18/2013  Long Term Goals: To be accomplished in 4 weeks:  1. Pt will improve FOTO 15 points to increase ease of ADLs  2. Pt will improve passive right shoulder flexion to 110 degrees to improve pain control with ADLs  3. Pt will increase passive right shoulder flexion to 145 degrees to minimize pain with ADLs    PLAN  '[]'   Upgrade activities as tolerated     '[x]'   Continue plan of care  '[]'   Update interventions per flow sheet       '[]'   Discharge due to:_  '[]'   Other:_      Lestine Box, PTA 11/23/2013  3:30 PM

## 2013-11-25 NOTE — Progress Notes (Signed)
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]    SUBJECTIVE  Pain Level (0-10 scale): 3  Any medication changes, allergies to medications, adverse drug reactions, diagnosis change, or new procedure performed?: '[x]'  No    '[]'  Yes (see summary sheet for update)  Subjective functional status/changes:   '[]'  No changes reported  Continues to complain of anterior shoulder pain    OBJECTIVE    16 min Therapeutic Exercise:  '[x]'  See flow sheet :   Rationale: increase ROM and increase strength to improve the patient???s ability to improve pain control   10 min Manual Therapy:  Per flow sheet   Rationale: decrease pain, increase ROM and increase tissue extensibility to improve PROM   Modality rationale: decrease edema, decrease inflammation and decrease pain to improve the patient???s ability to increase ease of ADLs   Min Type Additional Details    '[]'  Estim: '[]' Att   '[]' Unatt        '[]' TENS instruct                  '[]' IFC  '[]' Premod   '[]' NMES                     '[]' Other:  '[]' w/US   '[]' w/ice   '[]' w/heat  Position:  Location:    '[]'   Traction: '[]'  Cervical       '[]' Lumbar                       '[]'  Prone          '[]' Supine                       '[]' Intermittent   '[]' Continuous Lbs:  '[]'  before manual  '[]'  after manual    '[]'   Ultrasound: '[]' Continuous   '[]'  Pulsed                           '[]' 1MHz   '[]' 3MHz Location:  W/cm2:    '[]'   Iontophoresis with dexamethasone         Location: '[]'  Take home patch   '[]'  In clinic   10 '[x]'   Ice     '[]'   heat  '[]'   Ice massage Position:seated to right shoulder Location:    '[]'   Laser  '[]'   Other: Position:  Location:    '[]'   Vasopneumatic Device Pressure:       '[]'  lo '[]'  med '[]'  hi   Temperature: '[]'  lo '[]'  med '[]'  hi   '[x]'  Skin assessment post-treatment:   '[x]' intact '[]' redness- no adverse reaction    '[]' redness ??? adverse reaction:           With   '[]'  TE   '[]'  TA   '[]'  neuro   '[]'  other: Patient Education: '[x]'  Review HEP    '[]'  Progressed/Changed HEP based on:   '[]'  positioning   '[]'  body mechanics   '[]'  transfers   '[]'  heat/ice application    '[]'  other:      Other Objective/Functional Measures: very guarded in all planes with PROM.  Flexion 95, abduction 92 and ER 60 with empty end feel.  Pt commented on increased warmth anterior shoulder and reports that is the site of increased pain.  Note visible stitch coming out of incision     Pain Level (0-10 scale) post treatment: 5    ASSESSMENT/Changes in Function: no improvement in PROM    Patient will continue to benefit from skilled PT services to modify and progress therapeutic interventions, address functional mobility deficits, address ROM deficits, address strength deficits, analyze and address soft tissue restrictions and analyze and cue movement patterns to attain remaining goals.     '[]'   See Plan of Care  '[x]'   See progress note/recertification  '[]'   See Discharge Summary         Progress towards goals /   Short Term Goals: To be accomplished in 2 weeks:  1. Pt will be compliant with HEP at least 2x/day to improve tolerance to PROM Met per patient report 11/12/2013  2. Pt will allow therapist to passively flex right shoulder to 75 degrees to prevent the detrimental effects of immobilization Met - allowed 110 degrees after relaxation techniques 11/12/2013  3. Pt will actively flex right elbow to 110 degrees to improve tolerance to manual therapy Met -elbow flexion 130 degrees 11/18/2013  Long Term Goals: To be accomplished in 4 weeks:  1. Pt will improve FOTO 15 points to increase ease of ADLs  Met - 15 point increase 11/25/2013  2. Pt will improve passive right shoulder flexion to 110 degrees to improve pain control with ADLs  Not met - pt will not consistently allow greater than 90 degrees 11/25/2013    PLAN  '[]'   Upgrade activities  as tolerated     '[x]'   Continue plan of care  '[]'   Update interventions per flow sheet       '[]'   Discharge due to:_  '[]'   Other:_      Glenetta Borg, PT 11/25/2013  3:36 PM

## 2013-11-25 NOTE — Progress Notes (Signed)
[facility] ??? 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 Had difficulty with active flexion and abduction prior to surgery secondary to severe pain  Visits from Start of Care: 9    Missed Visits: 0      Key Functional Changes: Pt has demonstrated some improvement in PROM but continues to guard against elevation greater than 90 degrees.      Flexion 95, abduction 92 and ER 60 with empty end feel. Pt commented on increased warmth anterior shoulder and reports that is the site of increased pain. Note visible stitch coming out of incision     Progress towards goals  Short Term Goals: To be accomplished in 2 weeks:  1. Pt will be compliant with HEP at least 2x/day to improve tolerance to PROM Met per patient report 11/12/2013  2. Pt will allow therapist to passively flex right shoulder to 75 degrees to prevent the detrimental effects of immobilization Met - allowed 110 degrees after relaxation techniques 11/12/2013  3. Pt will actively flex right elbow to 110 degrees to improve tolerance to manual therapy Met -elbow flexion 130 degrees 11/18/2013  Long Term Goals: To be accomplished in 4 weeks:  1. Pt will improve FOTO 15 points to increase ease of ADLs Met - 15 point increase 11/25/2013  2. Pt will improve passive right shoulder flexion to 110 degrees to improve pain control with ADLs Not met - pt will not consistently allow greater than 90 degrees 11/25/2013    Updated Goals: to  be achieved in 4 weeks:  1.  Pt will increase FOTO to 50 to increase ease of ADLs  2.  Pt will demo full PROM in all planes to progress to strengthening phase  3.  Pt will demo 110 degrees of assisted shoulder flexion to increase eas e of ADLs.     ASSESSMENT/RECOMMENDATIONS:  [x] Continue therapy per initial plan/protocol at a frequency of  3 x per week for 4 weeks  [] Continue therapy with the following recommended changes:_____________________      _____________________________________________________________________  [] Discontinue therapy progressing towards or have reached established goals  [] Discontinue therapy due to lack of appreciable progress towards goals  [] Discontinue therapy due to lack of attendance or compliance  [] Await Physician's recommendations/decisions regarding therapy  [] Other:________________________________________________________________    Thank you for this referral.    Glenetta Borg, PT 11/25/2013 3:56 PM  NOTE TO PHYSICIAN:  PLEASE COMPLETE THE ORDERS BELOW AND   FAX TO [facility]: [phone]  If you are unable to process this request in 24 hours please contact our office: [phone]    ? I have read the above report and request that my patient continue as recommended.  ? I have read the above report and request that my patient continue therapy with the following changes/special instructions:__________________________________________________________  ? I have read the above report and request that my patient be discharged from therapy.  Physician???s signature: ______________________________Date: ______Time:______

## 2013-11-28 NOTE — Progress Notes (Signed)
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  Pt was concerned that pain is increasing instead of getting better.  Therapist explained that stiffness creates increased pain over time  OBJECTIVE    20 min Therapeutic Exercise:  [x]  See flow sheet :   Rationale: increase ROM to improve the patient???s ability to control pain   10 min Manual Therapy:  Per flow sheet   Rationale: decrease pain, increase ROM and increase tissue extensibility to increase ease of ADLs            With   []  TE   []  TA   []  neuro   []  other: Patient Education: [x]  Review HEP    []  Progressed/Changed HEP based on:   []  positioning   []  body mechanics   []  transfers   []  heat/ice application    []  other:      Other Objective/Functional Measures: Pt continues to guard heavily during manual treatment with empty end feel.  ROM continues to be inconsistent with patient heavily guarding at different points in PROM each visit.  Today, PROM was better than Friday: scaption 115, abduction 130, ER 70     Pain Level (0-10 scale) post treatment: 7    ASSESSMENT/Changes in Function: inconsistent PROM measurements between sessions    Patient will continue to benefit from skilled PT services to modify and progress therapeutic interventions, address functional mobility deficits, address ROM deficits, address strength deficits, analyze and address soft  tissue restrictions and analyze and cue movement patterns to attain remaining goals.     []   See Plan of Care  []   See progress note/recertification  []   See Discharge Summary         Progress towards goals:  Updated Goals: to be achieved in 4 weeks:  1. Pt will increase FOTO to 50 to increase ease of ADLs  2. Pt will demo full PROM in all planes to progress to strengthening phase  3. Pt will demo 110 degrees of assisted shoulder flexion to increase eas e of ADLs.     PLAN  []   Upgrade activities as tolerated     [x]   Continue plan of care  []   Update interventions per flow sheet       []   Discharge due to:_  []   Other:_      Glenetta Borg, PT 11/28/2013  4:13 PM

## 2013-11-30 NOTE — Progress Notes (Signed)
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   Min Type Additional Details    []  Estim: [] Att   [] Unatt        [] TENS instruct                  [] IFC  [] Premod   [] NMES                     [] Other:  [] w/US   [] w/ice   [] w/heat  Position:  Location:    []   Traction: []  Cervical       [] Lumbar                       []  Prone          [] Supine                       [] Intermittent   [] Continuous Lbs:  []  before manual  []  after manual    []   Ultrasound: [] Continuous   []  Pulsed                           [] 1MHz   [] 3MHz Location:  W/cm2:    []   Iontophoresis with dexamethasone         Location: []  Take home patch   []  In clinic   10 [x]   Ice     []   heat  []   Ice massage Position: Seated  Location:(R) shoulder    []   Laser  []   Other: Position:  Location:    []   Vasopneumatic Device Pressure:       []  lo []  med []  hi   Temperature: []  lo []  med []  hi   []  Skin assessment post-treatment:  [] intact [] redness- no adverse reaction    [] redness ??? adverse reaction:     25 min Therapeutic Exercise:  [x]  See flow sheet :   Rationale: increase ROM, increase strength and increase proprioception to improve the patient???s ability to perform functional activities.     10 min Manual Therapy:  ST/GH joint mobs, PROM, STM/DTM (R) UT, lev scap.   Rationale:  decrease pain, increase ROM, increase tissue extensibility and decrease trigger points to improve activity tolerance.          With   [x]  TE   []  TA   []  neuro   []  other: Patient Education: [x]  Review HEP    []  Progressed/Changed HEP based on:   []  positioning   []  body mechanics   []  transfers   []  heat/ice application    []  other:      Other Objective/Functional Measures:  Performed exercises per flow  sheet.    Pain Level (0-10 scale) post treatment: 5/10    ASSESSMENT/Changes in Function:     Patient will continue to benefit from skilled PT services to modify and progress therapeutic interventions, address functional mobility deficits, address ROM deficits, address strength deficits and analyze and address soft tissue restrictions to attain remaining goals.     [x]   See Plan of Care  []   See progress note/recertification  []   See Discharge Summary         Progress towards goals / Updated goals:  Updated Goals: to be achieved in 4 weeks:   1. Pt will increase FOTO to 50 to increase ease of ADLs  2. Pt will demo full PROM in all planes to progress to strengthening phase  3. Pt will demo 110 degrees of assisted shoulder flexion to increase eas e of ADLs.     PLAN  []   Upgrade activities as tolerated     [x]   Continue plan of care  []   Update interventions per flow sheet       []   Discharge due to:_  []   Other:_      Lestine Box, PTA 11/30/2013  3:52 PM

## 2013-12-05 NOTE — Progress Notes (Signed)
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   Min Type Additional Details    []  Estim: [] Att   [] Unatt        [] TENS instruct                  [] IFC  [] Premod   [] NMES                     [] Other:  [] w/US   [] w/ice   [] w/heat  Position:  Location:    []   Traction: []  Cervical       [] Lumbar                       []  Prone          [] Supine                       [] Intermittent   [] Continuous Lbs:  []  before manual  []  after manual    []   Ultrasound: [] Continuous   []  Pulsed                           [] 1MHz   [] 3MHz Location:  W/cm2:    []   Iontophoresis with dexamethasone         Location: []  Take home patch   []  In clinic   5 [x]   Ice     []   heat  []   Ice massage Position: Seated  Location: (R) shoulder    []   Laser  []   Other: Position:  Location:    []   Vasopneumatic Device Pressure:       []  lo []  med []  hi   Temperature: []  lo []  med []  hi   []  Skin assessment post-treatment:  [] intact [] redness- no adverse reaction    [] redness ??? adverse reaction:     26 min Therapeutic Exercise:  [x]  See flow sheet :   Rationale: increase ROM, increase strength and increase proprioception to improve the patient???s ability to perform functional activities.    10 min Manual Therapy:  ST/GH joint mobs, PROM, DTM/TPR (R) UT, lev  scap, subscap.   Rationale: decrease pain, increase ROM, increase tissue extensibility and decrease trigger points to improve          With   [x]  TE   []  TA   []  neuro   []  other: Patient Education: [x]  Review HEP    []  Progressed/Changed HEP based on:   []  positioning   []  body mechanics   []  transfers   []  heat/ice application    []  other:      Other Objective/Functional Measures: Performed  exercises well per flow sheet.     Pain Level (0-10 scale) post treatment: 4/10    ASSESSMENT/Changes in Function:     Patient will continue to benefit from skilled PT services to modify and progress therapeutic interventions, address functional mobility deficits, address ROM deficits, address strength deficits, analyze and address soft tissue restrictions and analyze and cue movement patterns to attain remaining goals.     [x]   See Plan of Care  []   See progress note/recertification  []   See Discharge Summary         Progress towards goals / Updated goals:  Updated Goals: to be achieved in 4 weeks:   1. Pt will increase FOTO to 50 to increase ease of ADLs  2. Pt will demo full PROM in all planes to progress to strengthening phase - Progressing slowly. 12/05/2013  3. Pt will demo 110 degrees of assisted shoulder flexion to increase eas e of ADLs.     PLAN  []   Upgrade activities as tolerated     [x]   Continue plan of care  []   Update interventions per flow sheet       []   Discharge due to:_  []   Other:_      Lestine Box, PTA 12/05/2013  3:05 PM

## 2013-12-07 NOTE — Progress Notes (Signed)
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]    SUBJECTIVE  Pain Level (0-10 scale): 7/10  Any medication changes, allergies to medications, adverse drug reactions, diagnosis change, or new procedure performed?: [x]  No    []  Yes (see summary sheet for update)  Subjective functional status/changes:   []  No changes reported  "It's still really sore, feels like it's catching on something."    OBJECTIVE    Modality rationale: PD   Min Type Additional Details    []  Estim: [] Att   [] Unatt        [] TENS instruct                  [] IFC  [] Premod   [] NMES                     [] Other:  [] w/US   [] w/ice   [] w/heat  Position:  Location:    []   Traction: []  Cervical       [] Lumbar                       []  Prone          [] Supine                       [] Intermittent   [] Continuous Lbs:  []  before manual  []  after manual    []   Ultrasound: [] Continuous   []  Pulsed                           [] 1MHz   [] 3MHz Location:  W/cm2:    []   Iontophoresis with dexamethasone         Location: []  Take home patch   []  In clinic    []   Ice     []   heat  []   Ice massage Position:  Location:    []   Laser  []   Other: Position:  Location:    []   Vasopneumatic Device Pressure:       []  lo []  med []  hi   Temperature: []  lo []  med []  hi   []  Skin assessment post-treatment:  [] intact [] redness- no adverse reaction    [] redness ??? adverse reaction:     33 min Therapeutic Exercise:  [x]  See flow sheet :   Rationale: increase ROM and increase strength to improve the patient???s ability to perform ADL's.    10 min Manual Therapy:  ST/GH joint mobs, PROM, STM (R) UT, lev scap, subscap, deltoid. Scar massage.   Rationale: decrease pain, increase ROM, increase tissue extensibility and  decrease trigger points to improve activity tolerance.          With   [x]  TE   []  TA   []  neuro   []  other: Patient Education: [x]  Review HEP    []  Progressed/Changed HEP based on:   []  positioning   []  body mechanics   []  transfers   []  heat/ice application    []  other:      Other Objective/Functional Measures: Pt expressed severe pain Monday night, took an  hour rest along with medication to reduce the pain. Today tolerated exercises fair, held ER doorway stretch. Slight decrease in symptoms after treatment.     Pain Level (0-10 scale) post treatment: 5/10    ASSESSMENT/Changes in Function:     Patient will continue to benefit from skilled PT services to modify and progress therapeutic interventions, address functional mobility deficits, address ROM deficits, address strength deficits and analyze and address soft tissue restrictions to attain remaining goals.     [x]   See Plan of Care  []   See progress note/recertification  []   See Discharge Summary         Progress towards goals / Updated goals:  1. Pt will increase FOTO to 50 to increase ease of ADLs  2. Pt will demo full PROM in all planes to progress to strengthening phase - Progressing slowly. 12/05/2013  3. Pt will demo 110 degrees of assisted shoulder flexion to increase eas e of ADLs.     PLAN  []   Upgrade activities as tolerated     [x]   Continue plan of care  []   Update interventions per flow sheet       []   Discharge due to:_  []   Other:_      Lestine Box, PTA 12/07/2013  3:11 PM

## 2013-12-13 NOTE — Progress Notes (Signed)
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   Min Type Additional Details    []  Estim: [] Att   [] Unatt        [] TENS instruct                  [] IFC  [] Premod   [] NMES                     [] Other:  [] w/US   [] w/ice   [] w/heat  Position:  Location:    []   Traction: []  Cervical       [] Lumbar                       []  Prone          [] Supine                       [] Intermittent   [] Continuous Lbs:  []  before manual  []  after manual    []   Ultrasound: [] Continuous   []  Pulsed                           [] 1MHz   [] 3MHz Location:  W/cm2:    []   Iontophoresis with dexamethasone         Location: []  Take home patch   []  In clinic   10 [x]   Ice     []   heat  []   Ice massage Position:  Location:    []   Laser  []   Other: Position:  Location:    []   Vasopneumatic Device Pressure:       []  lo []  med []  hi   Temperature: []  lo []  med []  hi   [x]  Skin assessment post-treatment:  [x] intact [] redness- no adverse reaction    [] redness ??? adverse reaction:     24 min Therapeutic Exercise:  [x]  See flow sheet :   Rationale: increase ROM and increase strength to improve the patient???s ability to tolerate ADLs     min Therapeutic Activity:  []   See flow sheet :   Rationale:   to improve the patient???s ability to       min Neuromuscular Re-education:  []   See flow sheet :   Rationale:    to improve the patient???s ability to     8 min Manual Therapy:  GH/STJ mobs PROM (R) shld   Rationale: decrease pain, increase ROM and increase tissue extensibility to improve mobility     min Gait Training:  ___ feet with ___ device on level surfaces with ___ level of assist   Rationale:          With   []   TE   []  TA   []  neuro   []  other: Patient Education: [x]  Review HEP    []  Progressed/Changed HEP based on:   []  positioning   []  body mechanics   []  transfers   []  heat/ice application    []  other:      Other Objective/Functional Measures: Req'd VCs due to mod mm guarding during PROM.     Pain Level (0-10 scale) post treatment: 3    ASSESSMENT/Changes in Function:     Patient will continue to benefit from skilled PT services to address functional mobility deficits, address ROM deficits and address strength deficits to attain remaining goals.     [x]   See Plan of Care  []   See progress note/recertification  []   See Discharge Summary         Progress towards goals / Updated goals:  1. Pt will increase FOTO to 50 to increase ease of ADLs  2. Pt will demo full PROM in all planes to progress to strengthening phase - Progressing slowly. 12/05/2013  3. Pt will demo 110 degrees of assisted shoulder flexion to increase eas e of ADLs.     PLAN  []   Upgrade activities as tolerated     [x]   Continue plan of care  []   Update interventions per flow sheet       []   Discharge due to:_  []   Other:_      Don Perking, PTA 12/13/2013  3:08 PM

## 2013-12-14 NOTE — Progress Notes (Signed)
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]    SUBJECTIVE  Pain Level (0-10 scale): 2/10  Any medication changes, allergies to medications, adverse drug reactions, diagnosis change, or new procedure performed?: [x]  No    []  Yes (see summary sheet for update)  Subjective functional status/changes:   []  No changes reported    OBJECTIVE    Modality rationale: PD   Min Type Additional Details    []  Estim: [] Att   [] Unatt        [] TENS instruct                  [] IFC  [] Premod   [] NMES                     [] Other:  [] w/US   [] w/ice   [] w/heat  Position:  Location:    []   Traction: []  Cervical       [] Lumbar                       []  Prone          [] Supine                       [] Intermittent   [] Continuous Lbs:  []  before manual  []  after manual    []   Ultrasound: [] Continuous   []  Pulsed                           [] 1MHz   [] 3MHz Location:  W/cm2:    []   Iontophoresis with dexamethasone         Location: []  Take home patch   []  In clinic    []   Ice     []   heat  []   Ice massage Position:  Location:    []   Laser  []   Other: Position:  Location:    []   Vasopneumatic Device Pressure:       []  lo []  med []  hi   Temperature: []  lo []  med []  hi   []  Skin assessment post-treatment:  [] intact [] redness- no adverse reaction    [] redness ??? adverse reaction:     19 min Therapeutic Exercise:  [x]  See flow sheet :   Rationale: increase ROM and increase strength to improve the patient???s ability to perform ADL's.    10 min Manual Therapy:  ST/GH joint mobs, PROM, STM/DTM (R) UT, lev scap, subscap.   Rationale: decrease pain, increase ROM, increase tissue extensibility and decrease trigger points to improve AROM once in AROM phase.     min Gait Training:  ___  feet with ___ device on level surfaces with ___ level of assist   Rationale:          With   [x]  TE   []  TA   []  neuro   []  other: Patient Education: [x]  Review HEP    []  Progressed/Changed HEP based on:   []  positioning   []  body mechanics   []  transfers   []  heat/ice application    []  other:  Other Objective/Functional Measures: Performed exercises well per flow sheet. Add supine wand flex and ER and isometric 6-way next visit.    Pain Level (0-10 scale) post treatment: 3/10    ASSESSMENT/Changes in Function:     Patient will continue to benefit from skilled PT services to modify and progress therapeutic interventions, address functional mobility deficits, address ROM deficits, address strength deficits and analyze and address soft tissue restrictions to attain remaining goals.     [x]   See Plan of Care  []   See progress note/recertification  []   See Discharge Summary         Progress towards goals / Updated goals:  1. Pt will increase FOTO to 50 to increase ease of ADLs  2. Pt will demo full PROM in all planes to progress to strengthening phase - Progressing slowly. 12/05/2013  3. Pt will demo 110 degrees of assisted shoulder flexion to increase eas e of ADLs.     PLAN  []   Upgrade activities as tolerated     [x]   Continue plan of care  []   Update interventions per flow sheet       []   Discharge due to:_  []   Other:_      Lestine Box, PTA 12/14/2013  4:09 PM

## 2013-12-15 NOTE — Progress Notes (Signed)
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 I don't know what caused it. I called the office, and the PA called me back and said that if doesn't get any better they can see me Monday."    OBJECTIVE  Modality rationale: decrease inflammation, decrease pain and increase tissue extensibility to improve the patient???s ability to perform ADLs   Min Type Additional Details    []  Estim: [] Att   [] Unatt        [] TENS instruct                  [] IFC  [] Premod   [] NMES                     [] Other:  [] w/US   [] w/ice   [] w/heat  Position:  Location:    []   Traction: []  Cervical       [] Lumbar                       []  Prone          [] Supine                       [] Intermittent   [] Continuous Lbs:  []  before manual  []  after manual    []   Ultrasound: [] Continuous   []  Pulsed                           [] 1MHz   [] 3MHz Location:  W/cm2:    []   Iontophoresis with dexamethasone         Location: []  Take home patch   []  In clinic   10 [x]   Ice     [x]   heat  []   Ice massage Position: Seated  Location: Heat to UT/LS region with CP to R shoulder with strap and shoulder supported by pillow    []   Laser  []   Other: Position:  Location:    []   Vasopneumatic Device Pressure:       []  lo []  med []  hi   Temperature: []  lo []  med []  hi   []  Skin assessment post-treatment:  [] intact [] redness- no adverse  reaction    [] redness ??? adverse reaction:     33 min Therapeutic Exercise:  [x]  See flow sheet :   Rationale: increase ROM and increase strength to improve the patient???s ability to perform ADLs with greater ease.     min Therapeutic Activity:  []   See flow sheet :   Rationale:   to improve the patient???s ability to       min Neuromuscular Re-education:  []   See flow sheet :   Rationale:   to improve the patient???s  ability to     15 min Manual Therapy:  Gentle PROM, TPR of R UT/LS and cervical paraspinal STM   Rationale: decrease pain, increase ROM and increase tissue extensibility to perform ADLs with greater ease.     min Gait Training:  ___ feet with ___ device on level surfaces with ___ level of assist   Rationale:          With   []  TE   []  TA   []  neuro   []  other: Patient Education: [x]  Review HEP    []  Progressed/Changed HEP based on:   []  positioning   []  body mechanics   []  transfers   []  heat/ice application    []  other:      Other Objective/Functional Measures: The patient displayed guarding through R shoulder during PROM. Even in lower ranges indicating excruciating pain with empty end feel.  Performed gentle mobility of shoulder as tolerated by patient.    Pain Level (0-10 scale) post treatment: 3/10    ASSESSMENT/Changes in Function: Continue POC.    Patient will continue to benefit from skilled PT services to modify and progress therapeutic interventions, address functional mobility deficits, address ROM deficits, address strength deficits, analyze and address soft tissue restrictions, analyze and cue movement patterns, analyze and modify body mechanics/ergonomics, assess and modify postural abnormalities and instruct in home and community integration to attain remaining goals.     []   See Plan of Care  []   See progress note/recertification  []   See Discharge Summary         Progress towards goals / Updated goals:  1. Pt will increase FOTO to 50 to increase ease of ADLs  2. Pt will demo full PROM in all  planes to progress to strengthening phase - Progressing slowly. 12/05/2013  3. Pt will demo 110 degrees of assisted shoulder flexion to increase eas e of ADLs.     PLAN  []   Upgrade activities as tolerated     [x]   Continue plan of care  []   Update interventions per flow sheet       []   Discharge due to:_  []   Other:_      Gerilyn Nestle, PT 12/15/2013  7:13 PM

## 2013-12-19 NOTE — Progress Notes (Signed)
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    OBJECTIVE  32 min Therapeutic Exercise:  [x]  See flow sheet :   Rationale: increase ROM and increase strength to improve the patient???s ability to increase shoulder ROM   8 min Manual Therapy:  Per flow sheet   Rationale: decrease pain and increase ROM to increase ease of ADLs            With   []  TE   []  TA   []  neuro   []  other: Patient Education: [x]  Review HEP    []  Progressed/Changed HEP based on:   []  positioning   []  body mechanics   []  transfers   []  heat/ice application    []  other:      Other Objective/Functional Measures: Continues to muscle guard and demo empty end feel all planes     Pain Level (0-10 scale) post treatment: 3    ASSESSMENT/Changes in Function: no improvement in pain control    Patient will continue to benefit from skilled PT services to modify and progress therapeutic interventions, address functional mobility deficits, address ROM deficits, address strength deficits, analyze and address soft tissue restrictions, analyze and cue movement patterns and analyze and modify body mechanics/ergonomics to attain remaining goals.     []   See Plan of Care  []   See progress note/recertification  []   See Discharge Summary         Progress towards goals / Updated goals:  1. Pt will increase FOTO to 50 to increase ease of ADLs  2. Pt  will demo full PROM in all planes to progress to strengthening phase - Progressing slowly. 12/05/2013  3. Pt will demo 110 degrees of assisted shoulder flexion to increase eas e of ADLs.    PLAN  []   Upgrade activities as tolerated     [x]   Continue plan of care  []   Update interventions per flow sheet       []   Discharge due to:_  []   Other:_      Glenetta Borg, PT 12/19/2013  3:47 PM

## 2013-12-21 NOTE — Progress Notes (Signed)
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]    SUBJECTIVE  Pain Level (0-10 scale): 2.5/10  Any medication changes, allergies to medications, adverse drug reactions, diagnosis change, or new procedure performed?: [x]  No    []  Yes (see summary sheet for update)  Subjective functional status/changes:   [x]  No changes reported    OBJECTIVE    Modality rationale: PD   Min Type Additional Details    []  Estim: [] Att   [] Unatt        [] TENS instruct                  [] IFC  [] Premod   [] NMES                     [] Other:  [] w/US   [] w/ice   [] w/heat  Position:  Location:    []   Traction: []  Cervical       [] Lumbar                       []  Prone          [] Supine                       [] Intermittent   [] Continuous Lbs:  []  before manual  []  after manual    []   Ultrasound: [] Continuous   []  Pulsed                           [] 1MHz   [] 3MHz Location:  W/cm2:    []   Iontophoresis with dexamethasone         Location: []  Take home patch   []  In clinic    []   Ice     []   heat  []   Ice massage Position:  Location:    []   Laser  []   Other: Position:  Location:    []   Vasopneumatic Device Pressure:       []  lo []  med []  hi   Temperature: []  lo []  med []  hi   []  Skin assessment post-treatment:  [] intact [] redness- no adverse reaction    [] redness ??? adverse reaction:     39 min Therapeutic Exercise:  [x]  See flow sheet :   Rationale: increase ROM, increase strength and increase proprioception to improve the patient???s ability to perform functional activities.    10 min Manual Therapy:  ST/GH joint mobs, PROM, STM/DTM (L) UT, lev scap, deltoid, subscap, bicep mm belly.   Rationale: decrease pain, increase ROM, increase tissue extensibility and decrease trigger  points to prepare pt to be able to perform OH activities.     min Gait Training:  ___ feet with ___ device on level surfaces with ___ level of assist   Rationale:          With   [x]  TE   []  TA   []  neuro   []  other: Patient Education: [x]  Review HEP    []  Progressed/Changed HEP based on:   []  positioning   []  body mechanics   []  transfers   []   heat/ice application    []  other:      Other Objective/Functional Measures: Tolerated exercises fair per flow sheet. Pt reports significant pain at end ranges. Supine wand AROM flex 90 degrees.    Pain Level (0-10 scale) post treatment: 3.5/10    ASSESSMENT/Changes in Function:     Patient will continue to benefit from skilled PT services to modify and progress therapeutic interventions, address functional mobility deficits, address ROM deficits, address strength deficits, analyze and address soft tissue restrictions and analyze and cue movement patterns to attain remaining goals.     [x]   See Plan of Care  []   See progress note/recertification  []   See Discharge Summary         Progress towards goals / Updated goals:  Reassess LTG's next visit for progress note.  1. Pt will increase FOTO to 50 to increase ease of ADLs   2. Pt will demo full PROM in all planes to progress to strengthening phase - Progressing slowly. 12/05/2013  3. Pt will demo 110 degrees of assisted shoulder flexion to increase eas e of ADLs.    PLAN  []   Upgrade activities as tolerated     [x]   Continue plan of care  []   Update interventions per flow sheet       []   Discharge due to:_  []   Other:_      Lestine Box, PTA 12/21/2013  4:24 PM

## 2013-12-26 NOTE — Progress Notes (Signed)
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   Min Type Additional Details    []  Estim: [] Att   [] Unatt        [] TENS instruct                  [] IFC  [] Premod   [] NMES                     [] Other:  [] w/US   [] w/ice   [] w/heat  Position:  Location:    []   Traction: []  Cervical       [] Lumbar                       []  Prone          [] Supine                       [] Intermittent   [] Continuous Lbs:  []  before manual  []  after manual    []   Ultrasound: [] Continuous   []  Pulsed                           [] 1MHz   [] 3MHz Location:  W/cm2:    []   Iontophoresis with dexamethasone         Location: []  Take home patch   []  In clinic    []   Ice     []   heat  []   Ice massage Position:  Location:    []   Laser  []   Other: Position:  Location:   10 [x]   Vasopneumatic Device Pressure:       [x]  lo []  med []  hi   Temperature: [x]  lo []  med []  hi   []  Skin assessment post-treatment:  [] intact [] redness- no adverse reaction    [] redness ??? adverse reaction:     29 min Therapeutic Exercise:  [x]  See flow sheet :   Rationale: increase ROM, increase strength and increase proprioception to improve the patient???s ability to perform functional activities.    10 min Manual Therapy:  Gentle ST/GH joint mobs, PROM, STM/DTM (R) UT, lev scap.   Rationale: decrease pain,  increase ROM, increase tissue extensibility and decrease trigger points to improve AROM tolerance once able.     min Gait Training:  ___ feet with ___ device on level surfaces with ___ level of assist   Rationale:          With   [x]  TE   []  TA   []  neuro   []  other: Patient Education: [x]  Review HEP    []  Progressed/Changed HEP based on:   []  positioning   []  body mechanics   []   transfers   []  heat/ice application    []  other:      Other Objective/Functional Measures: Performed exercises well per flow sheet. (R) Shoulder PROM flex 137, abd 120, IR 64, ER 65 degrees.     Pain Level (0-10 scale) post treatment: 3/10    ASSESSMENT/Changes in Function:     []   See Plan of Care  [x]   See progress note/recertification  []   See Discharge Summary         Progress towards goals / Updated goals:  1. Pt will increase FOTO to 50 to increase ease of ADLs - 38%. 12/13/2013  2. Pt will demo full PROM in all planes to progress to strengthening phase - (R) Shoulder PROM flex 137, abd 120, IR 64, ER 65 degrees. 12/26/2013  3. Pt will demo 110 degrees of assisted shoulder flexion to increase ease of ADLs. - Not tested. 12/26/2013    PLAN  []   Upgrade activities as tolerated     [x]   Continue plan of care  []   Update interventions per flow sheet       []   Discharge due to:_  []   Other:_      Lestine Box, PTA 12/26/2013  4:15 PM

## 2013-12-27 NOTE — Progress Notes (Signed)
[facility] ??? 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 Had difficulty with active flexion and abduction prior to surgery secondary to severe pain    Visits from Start of Care: 19    Missed Visits: 0      Key Functional Changes: (R) Shoulder PROM flex 137, abd 120, IR 64, ER 65 degrees. Pt cont to guard excessively during treatment demonstrating an empty end feel.  Pt reports catching sensations - most notable during passive flexion but can occur during ER/IR and abduction.  Continue PT to begin to address strength and AROM    Progress towards goals   1. Pt will increase FOTO to 50 to increase ease of ADLs - 38%. 12/13/2013  2. Pt will demo full PROM in all planes to progress to strengthening phase - (R) Shoulder PROM flex 137, abd 120, IR 64, ER 65 degrees. 12/26/2013  3. Pt will demo 110 degrees of assisted shoulder flexion to increase ease of ADLs. - Not tested. 12/26/2013    Updated Goals: to be achieved in 4 weeks:  1.  Pt will improve FOTO to 50 to increase ease of ADLs  2.  Pt will improve active right shoulder elevation to 90 degrees to increase ease of ADLs  3.  Pt will improve right shoulder strength to 3/5 within available range to increase ease of ADls    ASSESSMENT/RECOMMENDATIONS:  [x] Continue therapy per initial plan/protocol at a frequency of  2-3 x per week for 4 weeks  [] Continue therapy with the following recommended changes:_____________________       _____________________________________________________________________  [] Discontinue therapy progressing towards or have reached established goals  [] Discontinue therapy due to lack of appreciable progress towards goals  [] Discontinue therapy due to lack of attendance or compliance  [] Await Physician's recommendations/decisions regarding therapy  [] Other:________________________________________________________________    Thank you for this referral.    Glenetta Borg, PT 12/27/2013 7:44 AM  NOTE TO PHYSICIAN:  PLEASE COMPLETE THE ORDERS BELOW AND   FAX TO [facility]: [phone]  If you are unable to process this request in 24 hours please contact our office: [phone]    ? I have read the above report and request that my patient continue as recommended.  ? I have read the above report and request that my patient continue therapy with the following changes/special instructions:__________________________________________________________  ? I have read the above report and request that my patient be discharged from therapy.    Physician???s signature: ______________________________Date: ______Time:______

## 2013-12-28 NOTE — Progress Notes (Signed)
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 Pt requested 5' only.   Min Type Additional Details    []  Estim: [] Att   [] Unatt        [] TENS instruct                  [] IFC  [] Premod   [] NMES                     [] Other:  [] w/US   [] w/ice   [] w/heat  Position:  Location:    []   Traction: []  Cervical       [] Lumbar                       []  Prone          [] Supine                       [] Intermittent   [] Continuous Lbs:  []  before manual  []  after manual    []   Ultrasound: [] Continuous   []  Pulsed                           [] 1MHz   [] 3MHz Location:  W/cm2:    []   Iontophoresis with dexamethasone         Location: []  Take home patch   []  In clinic   5 [x]   Ice     []   heat  []   Ice massage Position: Seated  Location: (R) Shoulder    []   Laser  []   Other: Position:  Location:    []   Vasopneumatic Device Pressure:       []  lo []  med []  hi   Temperature: []  lo []  med []  hi   []  Skin assessment post-treatment:  [] intact [] redness- no adverse reaction    [] redness ??? adverse reaction:     36 min Therapeutic Exercise:  [x]  See flow sheet :   Rationale: increase ROM, increase strength and increase proprioception to improve the patient???s ability to perform functional activities.    8 min Manual Therapy:  ST/GH joint mobs, PROM, STM/DTM (R) UT, lev scap, subscap.   Rationale:  decrease pain, increase ROM, increase tissue extensibility and decrease trigger points to improve activity tolerance.     min Gait Training:  ___ feet with ___ device on level surfaces with ___ level of assist   Rationale:          With   [x]  TE   []  TA   []  neuro   []  other: Patient Education: [x]  Review HEP    []  Progressed/Changed HEP based on:   []  positioning   []  body mechanics   []   transfers   []  heat/ice application    []  other:      Other Objective/Functional Measures: Added standing wand IR, ext and ER 10x each, pulleys, wall slides and side-lying PRE's. Pt reported pain with supine wand flex, required mod(A) to perform supine wand SA punches.     Pain Level (0-10 scale) post treatment: 2/10    ASSESSMENT/Changes in Function:     Patient will continue to benefit from skilled PT services to modify and progress therapeutic interventions, address functional mobility deficits, address ROM deficits, address strength deficits, analyze and address soft tissue restrictions and analyze and cue movement patterns to attain remaining goals.     [x]   See Plan of Care  []   See progress note/recertification  []   See Discharge Summary         Progress towards goals / Updated goals:  1. Pt will increase FOTO to 50 to increase ease of ADLs - 38%. 12/13/2013  2. Pt will demo full PROM in all planes to progress to strengthening phase - (R) Shoulder PROM flex 137, abd 120, IR 64, ER 65 degrees. 12/26/2013  3. Pt will demo 110 degrees of assisted shoulder flexion to increase ease of ADLs. - Not tested. 12/26/2013  Updated Goals: to be achieved in 4 weeks:  1. Pt will improve FOTO to 50 to increase ease of ADLs  2. Pt will improve active right shoulder elevation to 90 degrees to increase ease of ADLs  3. Pt will improve right shoulder strength to 3/5 within available range to increase ease of ADls    PLAN  []   Upgrade activities as tolerated     [x]   Continue plan of care  []   Update interventions per flow sheet       []   Discharge  due to:_  []   Other:_      Lestine Box, PTA 12/28/2013  3:54 PM

## 2014-01-04 NOTE — Progress Notes (Signed)
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   Min Type Additional Details    '[]'  Estim: '[]' Att   '[]' Unatt        '[]' TENS instruct                  '[]' IFC  '[]' Premod   '[]' NMES                     '[]' Other:  '[]' w/US   '[]' w/ice   '[]' w/heat  Position:  Location:    '[]'   Traction: '[]'  Cervical       '[]' Lumbar                       '[]'  Prone          '[]' Supine                       '[]' Intermittent   '[]' Continuous Lbs:  '[]'  before manual  '[]'  after manual    '[]'   Ultrasound: '[]' Continuous   '[]'  Pulsed                           '[]' 1MHz   '[]' 3MHz Location:  W/cm2:    '[]'   Iontophoresis with dexamethasone         Location: '[]'  Take home patch   '[]'  In clinic   5 '[x]'   Ice     '[]'   heat  '[]'   Ice massage Position: Seated  Location: (R) Shoulder    '[]'   Laser  '[]'   Other: Position:  Location:    '[]'   Vasopneumatic Device Pressure:       '[]'  lo '[]'  med '[]'  hi   Temperature: '[]'  lo '[]'  med '[]'  hi   '[]'  Skin assessment post-treatment:  '[]' intact '[]' redness- no adverse reaction    '[]' redness ??? adverse reaction:     37 min Therapeutic Exercise:  '[x]'  See flow sheet :   Rationale: increase ROM, increase strength and increase proprioception to improve the patient???s ability to perform functional activities.    8 min Manual Therapy:  ST/GH joint  mobs, PROM, DTM/TPR (R) UT, lev scap, subscap   Rationale: decrease pain, increase ROM, increase tissue extensibility and decrease trigger points to improve OH activity tolerance.     min Gait Training:  ___ feet with ___ device on level surfaces with ___ level of assist   Rationale:          With   '[x]'  TE   '[]'  TA   '[]'  neuro   '[]'  other: Patient Education: '[x]'  Review HEP    '[]'  Progressed/Changed HEP based on:   '[]'   positioning   '[]'  body mechanics   '[]'  transfers   '[]'  heat/ice application    '[]'  other:      Other Objective/Functional Measures: Tolerated exercises fair per flow sheet. Pt reports significant pain with AROM.     Pain Level (0-10 scale) post treatment: 2/10    ASSESSMENT/Changes in Function: FOTO improved to 51%.    Patient will continue to benefit from skilled PT services to modify and progress therapeutic interventions, address functional mobility deficits, address ROM deficits, address strength deficits, analyze and address soft tissue restrictions and analyze and cue movement patterns to attain remaining goals.     '[x]'   See Plan of Care  '[]'   See progress note/recertification  '[]'   See Discharge Summary         Progress towards goals / Updated goals:  1. Pt will increase FOTO to 50 to increase ease of ADLs - 38%. 12/13/2013  2. Pt will demo full PROM in all planes to progress to strengthening phase - (R) Shoulder PROM flex 137, abd 120, IR 64, ER 65 degrees. 12/26/2013  3. Pt will demo 110 degrees of assisted shoulder flexion to increase ease of ADLs. - Not tested. 12/26/2013  Updated Goals: to be achieved in 4 weeks:  1. Pt will improve FOTO to 50 to increase ease of ADLs - Met, 51%. 01/04/2014  2. Pt will improve active right shoulder elevation to 90 degrees to increase ease of ADLs  3. Pt will improve right shoulder strength to 3/5 within available range to increase ease of ADls    PLAN  '[]'   Upgrade activities as tolerated     '[x]'   Continue plan of care  '[]'   Update interventions per flow sheet       '[]'    Discharge due to:_  '[]'   Other:_      Lestine Box, PTA 01/04/2014  3:43 PM

## 2014-01-06 NOTE — Progress Notes (Signed)
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   Min Type Additional Details    '[]'  Estim: '[]' Att   '[]' Unatt        '[]' TENS instruct                  '[]' IFC  '[]' Premod   '[]' NMES                     '[]' Other:  '[]' w/US   '[]' w/ice   '[]' w/heat  Position:  Location:    '[]'   Traction: '[]'  Cervical       '[]' Lumbar                       '[]'  Prone          '[]' Supine                       '[]' Intermittent   '[]' Continuous Lbs:  '[]'  before manual  '[]'  after manual    '[]'   Ultrasound: '[]' Continuous   '[]'  Pulsed                           '[]' 1MHz   '[]' 3MHz Location:  W/cm2:    '[]'   Iontophoresis with dexamethasone         Location: '[]'  Take home patch   '[]'  In clinic   10 '[x]'   Ice     '[]'   heat  '[]'   Ice massage Position: Seated  Location: R shoulder    '[]'   Laser  '[]'   Other: Position:  Location:    '[]'   Vasopneumatic Device Pressure:       '[]'  lo '[]'  med '[]'  hi   Temperature: '[]'  lo '[]'  med '[]'  hi   '[]'  Skin assessment post-treatment:  '[]' intact '[]' redness- no adverse reaction    '[]' redness ??? adverse reaction:      24 min Therapeutic Exercise:  '[x]'  See flow sheet :   Rationale: increase ROM and increase strength to improve the patient???s ability to reach overhead with greater ease.    12 min Manual Therapy:  PROM all planes.   Rationale: decrease  pain, increase ROM and increase tissue extensibility to overhead reaching and ADL ease.      With   '[]'  TE   '[]'  TA   '[]'  neuro   '[]'  other: Patient Education: '[x]'  Review HEP    '[]'  Progressed/Changed HEP based on:   '[]'  positioning   '[]'  body mechanics   '[]'  transfers   '[]'  heat/ice application    '[]'  other:      Other Objective/Functional Measures:      Pain  Level (0-10 scale) post treatment: 2/10    ASSESSMENT/Changes in Function: Continue POC    Patient will continue to benefit from skilled PT services to modify and progress therapeutic interventions, address functional mobility deficits, address ROM deficits, address strength deficits, analyze and address soft tissue restrictions, analyze and cue movement patterns, analyze and modify body mechanics/ergonomics, assess and modify postural abnormalities and instruct in home and community integration to attain remaining goals.     '[]'   See Plan of Care  '[]'   See progress note/recertification  '[]'   See Discharge Summary         Progress towards goals / Updated goals:  1. Pt will increase FOTO to 50 to increase ease of ADLs - 38%. 12/13/2013  2. Pt will demo full PROM in all planes to progress to strengthening phase - (R) Shoulder PROM flex 137, abd 120, IR 64, ER 65 degrees. 12/26/2013  3. Pt will demo 110 degrees of assisted shoulder flexion to increase ease of ADLs. - Not tested. 12/26/2013  Updated Goals: to be achieved in 4 weeks:  1. Pt will improve FOTO to 50 to increase ease of ADLs - Met, 51%. 01/04/2014  2. Pt will improve active right shoulder elevation to 90 degrees to increase ease of ADLs  3. Pt will improve right shoulder strength to 3/5 within available range to increase ease of ADls    PLAN  '[]'   Upgrade activities as tolerated     '[x]'   Continue plan of care  '[]'   Update interventions per flow sheet       '[]'   Discharge due to:_  '[]'   Other:_      Gerilyn Nestle, PT 01/06/2014  5:53 PM

## 2014-01-09 NOTE — Progress Notes (Signed)
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   Min Type Additional Details   5 '[x]'   Ice     '[]'   heat  '[]'   Ice massage Position: seated  Location:   '[]'  Skin assessment post-treatment:  '[]' intact '[]' redness- no adverse reaction    '[]' redness ??? adverse reaction:      44 min Therapeutic Exercise:  '[x]'  See flow sheet : improve overhead reaching   Rationale: increase ROM and increase strength to improve the patient???s ability to improve overhead reaching.    10 min Manual Therapy:  Performed PROM all planes;    Rationale: decrease pain, increase ROM and increase tissue extensibility to improve          With   '[]'  TE   '[]'  TA   '[]'  neuro   '[]'  other: Patient Education: '[x]'  Review HEP    '[]'  Progressed/Changed HEP based on:   '[]'  positioning   '[]'  body mechanics   '[]'  transfers   '[]'  heat/ice application    '[]'  other:      Other Objective/Functional Measures: Progressing flexion PROM. Assess goals NV    Pain Level (0-10 scale) post treatment: 1/10    ASSESSMENT/Changes in Function:     Patient will continue to benefit from skilled PT services to modify and progress therapeutic interventions, address functional mobility deficits, address ROM deficits, address strength deficits, analyze and address soft tissue restrictions, analyze and cue  movement patterns, analyze and modify body mechanics/ergonomics, assess and modify postural abnormalities and instruct in home and community integration to attain remaining goals.     '[]'   See Plan of Care  '[]'   See progress note/recertification  '[]'   See Discharge Summary         Progress towards goals / Updated goals:  1. Pt will increase FOTO to 50 to increase ease of ADLs - 38%. 12/13/2013  2. Pt will demo full PROM in all planes to progress to strengthening phase - (R) Shoulder PROM flex 137, abd 120, IR 64, ER 65 degrees. 12/26/2013  3. Pt will demo 110 degrees of assisted shoulder flexion to increase ease of ADLs. - Not tested. 12/26/2013  Updated Goals: to be achieved in 4 weeks:  1. Pt will improve FOTO to 50 to increase ease of ADLs - Met, 51%. 01/04/2014  2. Pt will improve active right shoulder elevation to 90 degrees to increase ease of ADLs.   3. Pt will improve right shoulder strength to 3/5 within available range to increase ease of ADLs.    PLAN  '[]'   Upgrade activities as tolerated     '[x]'   Continue plan of care  '[]'   Update interventions per flow sheet       '[]'   Discharge  due to:_  '[]'   Other:_      Gerilyn Nestle, PT 01/09/2014  5:25 PM

## 2014-01-11 NOTE — Progress Notes (Signed)
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    OBJECTIVE    Modality rationale: TC per pt.   Min Type Additional Details    [] Estim: []Att   []Unatt        []TENS instruct                  []IFC  []Premod   []NMES                     []Other:  []w/US   []w/ice   []w/heat  Position:  Location:    []  Traction: [] Cervical       []Lumbar                       [] Prone          []Supine                       []Intermittent   []Continuous Lbs:  [] before manual  [] after manual    []  Ultrasound: []Continuous   [] Pulsed                           []1MHz   []3MHz Location:  W/cm2:    []  Iontophoresis with dexamethasone         Location: [] Take home patch   [] In clinic    []  Ice     []  heat  []  Ice massage Position:  Location:    []  Laser  []  Other: Position:  Location:    []  Vasopneumatic Device Pressure:       [] lo [] med [] hi   Temperature: [] lo [] med [] hi   [] Skin assessment post-treatment:  []intact []redness- no adverse reaction    []redness ??? adverse reaction:     18 min Therapeutic Exercise:  [x] See flow sheet :   Rationale: increase ROM, increase strength and increase proprioception to improve the patient???s ability to perform functional activities.    8 min Manual Therapy:  ST/GH joint mobs, PROM, STM/DTM (R) UT, lev scap and subscap.   Rationale: decrease pain, increase ROM, increase  tissue extensibility and decrease trigger points to improve OH activity tolerance.          With   [x] TE   [] TA   [] neuro   [] other: Patient Education: [x] Review HEP    [] Progressed/Changed HEP based on:   [] positioning   [] body mechanics   [] transfers   [] heat/ice application    [] other:      Other Objective/Functional Measures: Performed exercises well per flow  sheet. Held several exercises secondary to pt's TC. AROM (R) shoulder flex 85 degrees.    Pain Level (0-10 scale) post treatment: 3/10    ASSESSMENT/Changes in Function:     Patient will continue to benefit from skilled PT services to modify and progress therapeutic interventions, address functional mobility deficits, address ROM deficits, address strength deficits, analyze and address soft tissue restrictions and analyze and cue movement patterns to attain remaining goals.     [x]  See Plan of Care  []  See progress note/recertification  []  See Discharge Summary         Progress towards goals / Updated goals:  1. Pt will increase FOTO to 50 to increase ease of ADLs - 38%. 12/13/2013  2. Pt will demo full PROM in all planes to progress to strengthening phase - (R) Shoulder PROM flex 137, abd 120, IR 64, ER 65 degrees. 12/26/2013  3. Pt will demo 110 degrees of assisted shoulder flexion to increase ease of ADLs. - Not tested. 12/26/2013  Updated Goals: to be achieved in 4 weeks:  1. Pt will improve FOTO to 50 to increase ease of ADLs - Met, 51%. 01/04/2014  2. Pt will improve active right shoulder elevation to 90 degrees to increase ease of ADLs. - AROM (R) shoulder flex 85 degrees. 01/11/2014  3. Pt will improve right shoulder strength to 3/5 within available range to increase ease of ADLs.    PLAN  []  Upgrade activities as tolerated     [x]  Continue plan of care  []  Update interventions per flow sheet       []  Discharge due to:_  []  Other:_      Lestine Box, PTA 01/11/2014  4:23 PM

## 2014-01-16 NOTE — Progress Notes (Signed)
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]    SUBJECTIVE  Pain Level (0-10 scale): 1.5  Any medication changes, allergies to medications, adverse drug reactions, diagnosis change, or new procedure performed?: _1  No    _2  Yes (see summary sheet for update)  Subjective functional status/changes:   _3  No changes reported  "It hurts to move my arm behind my back like that" - pt reporting pain with IR movements    OBJECTIVE    Modality rationale: decrease pain to improve the patient???s ability to perform ADLs   Min Type Additional Details    _4  Estim: _5 Att   _6 Unatt        _7 TENS instruct                  _8 IFC  _9 Premod   _10 NMES                     _11 Other:  _12 w/US   _13 w/ice   _14 w/heat  Position:  Location:    _15   Traction: _16  Cervical       _17 Lumbar                       _18  Prone          _19 Supine                       _20 Intermittent   _21 Continuous Lbs:  _22  before manual  _23  after manual    _24   Ultrasound: _25 Continuous   _26  Pulsed                           _27 1MHz   _28 3MHz Location:  W/cm2:    _29   Iontophoresis with dexamethasone         Location: _30  Take home patch   _31  In clinic   8 _32   Ice     _33   heat  _34   Ice massage Position: seated  Location: R shoulder    _35   Laser  _36   Other: Position:  Location:    _37   Vasopneumatic Device Pressure:       _38  lo _39  med _40  hi   Temperature: _41  lo _42  med _43  hi   _44  Skin assessment post-treatment:  _45 intact _46 redness- no adverse reaction    _47 redness ??? adverse reaction:     14 min Therapeutic Exercise:  _48  See flow sheet :   Rationale: increase ROM and increase strength to improve the patient???s ability to perform ADLs    8 min Neuromuscular Re-education:  _49   See flow sheet :   Rationale:  increase ROM, increase strength and improve coordination  to improve the patient's GH/STJ mechanics    10 min Manual Therapy:  GH mobs, PROM   Rationale: decrease pain, increase ROM and increase tissue extensibility to increase ease of ADLs            With   _50  TE   _51  TA   _52  neuro   _53  other: Patient Education: _54  Review HEP    _55  Progressed/Changed  HEP based on:   _0  positioning   _1  body mechanics   _2  transfers   <[id number]>_0<\/FBPZWCHENIDPOEUM>_3  heat/ice application    <[id number]>_1<\/PJKDTOIZTIWPYKDX>_8  other:      Other Objective/Functional Measures: R shldr strength 3-/5; R shldr AAROM flexion= 130; R shldr PROM flex= 140, abd= 100, IR= 64 ER= 65; R shldr AROM flex= 120     Pain Level (0-10 scale) post treatment: 2    ASSESSMENT/Changes in Function: Pt demonstrates improvements with R shldr ROM in all planes but still lacking normal ranges without pain. Scapular and GH strength is improving as well, but still unable to hold against any resistance during break tests and demonstrates difficulty with movements against gravity.    Patient will continue to benefit from skilled PT services to modify and progress therapeutic interventions, address functional mobility deficits, address ROM deficits, address strength deficits, analyze and address soft tissue restrictions, analyze and cue movement patterns, analyze and modify body mechanics/ergonomics and instruct in home and community integration to attain remaining goals.     _5   See Plan of Care  _6   See progress note/recertification  <[id number]>_3<\/XTKWIOXBDZHGDJME>_2   See Discharge Summary         Progress towards goals / Updated goals:  1. Pt will increase FOTO to 50 to increase ease of ADLs - 38%. 12/13/2013  2. Pt will demo full PROM in all planes to progress to strengthening phase Progressing- R Shoulder PROM flex 140, abd 100, IR 64, ER 65 degrees. 01/16/14  3. Pt will demo 110 degrees of assisted shoulder flexion to increase ease of ADLs. Met- R shldr AAROM flex= 130 01/16/14  Updated Goals: to be achieved in 4 weeks:  1. Pt will improve FOTO to 50 to  increase ease of ADLs - Met, 51%. 01/04/2014  2. Pt will improve active right shoulder elevation to 90 degrees to increase ease of ADLs.  Met- R shldr AROM flex= 120 01/16/14  3. Pt will improve right shoulder strength to 3/5 within available range to increase ease of ADLs. Not met- R shldr strength flex/abd measured at 3-/5 01/16/14    PLAN  _8   Upgrade activities as tolerated     _9   Continue plan of care  _10   Update interventions per flow sheet       _11   Discharge due to:_  _12   Other:_      Mauricio Po 01/16/2014  3:45 PM

## 2014-01-18 NOTE — Progress Notes (Signed)
Pt did not see the doctor. To reschedule.

## 2014-01-18 NOTE — Progress Notes (Signed)
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   Min Type Additional Details    [] Estim: []Att   []Unatt        []TENS instruct                  []IFC  []Premod   []NMES                     []Other:  []w/US   []w/ice   []w/heat  Position:  Location:    []  Traction: [] Cervical       []Lumbar                       [] Prone          []Supine                       []Intermittent   []Continuous Lbs:  [] before manual  [] after manual    []  Ultrasound: []Continuous   [] Pulsed                           []1MHz   []3MHz Location:  W/cm2:    []  Iontophoresis with dexamethasone         Location: [] Take home patch   [] In clinic   5 [x]  Ice     []  heat  []  Ice massage Position: Seated  Location: (R) Shoulder    []  Laser  []  Other: Position:  Location:    []  Vasopneumatic Device Pressure:       [] lo [] med [] hi   Temperature: [] lo [] med [] hi   [] Skin assessment post-treatment:  []intact []redness- no adverse reaction    []redness ??? adverse reaction:     31 min Therapeutic Exercise:  [x] See flow sheet :   Rationale: increase ROM and increase strength to improve the patient???s ability to perform functional activities.    8 min Manual Therapy:  ST/GH joint mobs, PROM, STM/DTM (R) UT, lev scap, subscap.   Rationale: decrease pain,  increase ROM, increase tissue extensibility and decrease trigger points to improve OH activity tolerance.          With   [x] TE   [] TA   [] neuro   [] other: Patient Education: [x] Review HEP    [] Progressed/Changed HEP based on:   [] positioning   [] body mechanics   [] transfers   [] heat/ice application    [] other:      Other Objective/Functional Measures: Tolerated exercises well  per flow sheet.     Pain Level (0-10 scale) post treatment: 2/10    ASSESSMENT/Changes in Function: FOTO increased to 60%.    Patient will continue to benefit from skilled PT services to address functional mobility deficits, address ROM deficits, address strength deficits, analyze and address soft tissue restrictions, analyze and cue movement patterns and analyze and modify body mechanics/ergonomics to attain remaining goals.     [x]  See Plan of Care  []  See progress note/recertification  []  See Discharge Summary         Progress towards goals / Updated goals:  1. Pt will increase FOTO to 50 to increase ease of ADLs - Met, 60%. 01/18/2014  2. Pt will demo full PROM in all planes to progress to strengthening phase Progressing- R Shoulder PROM flex 140, abd 100, IR 64, ER 65 degrees. 01/16/14  3. Pt will demo 110 degrees of assisted shoulder flexion to increase ease of ADLs. Met- R shldr AAROM flex= 130 01/16/14  Updated Goals: to be achieved in 4 weeks:  1. Pt will improve FOTO to 50 to increase ease of ADLs - Met, 51%. 01/04/2014  2. Pt will improve active right shoulder elevation to 90 degrees to increase ease of ADLs.  Met- R shldr AROM flex= 120 01/16/14  3. Pt will improve right shoulder strength to 3/5 within available range to increase ease of ADLs. Not met- R shldr strength flex/abd measured at 3-/5 01/16/14     PLAN  []  Upgrade activities as tolerated     [x]  Continue plan of care  []  Update interventions per flow sheet       []  Discharge due to:_  []  Other:_      Lestine Box, PTA 01/18/2014  3:16 PM

## 2014-01-23 NOTE — Progress Notes (Signed)
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    OBJECTIVE    Modality rationale: PD   Min Type Additional Details    '[]'  Estim: '[]' Att   '[]' Unatt        '[]' TENS instruct                  '[]' IFC  '[]' Premod   '[]' NMES                     '[]' Other:  '[]' w/US   '[]' w/ice   '[]' w/heat  Position:  Location:    '[]'   Traction: '[]'  Cervical       '[]' Lumbar                       '[]'  Prone          '[]' Supine                       '[]' Intermittent   '[]' Continuous Lbs:  '[]'  before manual  '[]'  after manual    '[]'   Ultrasound: '[]' Continuous   '[]'  Pulsed                           '[]' 1MHz   '[]' 3MHz Location:  W/cm2:    '[]'   Iontophoresis with dexamethasone         Location: '[]'  Take home patch   '[]'  In clinic    '[]'   Ice     '[]'   heat  '[]'   Ice massage Position:  Location:    '[]'   Laser  '[]'   Other: Position:  Location:    '[]'   Vasopneumatic Device Pressure:       '[]'  lo '[]'  med '[]'  hi   Temperature: '[]'  lo '[]'  med '[]'  hi   '[]'  Skin assessment post-treatment:  '[]' intact '[]' redness- no adverse reaction    '[]' redness ??? adverse reaction:     25 min Therapeutic Exercise:  '[x]'  See flow sheet :   Rationale: increase ROM and increase strength to improve the patient???s ability to perform functional activities.    8 min Manual Therapy:  ST/GH joint mobs, PROM, STM/DTM (R)UT, lev scap and subscap.   Rationale: decrease pain, increase ROM, increase tissue extensibility and decrease trigger points to  improve OH activity tolerance.     min Gait Training:  ___ feet with ___ device on level surfaces with ___ level of assist   Rationale:          With   '[x]'  TE   '[]'  TA   '[]'  neuro   '[]'  other: Patient Education: '[x]'  Review HEP    '[]'  Progressed/Changed HEP based on:   '[]'  positioning   '[]'  body mechanics   '[]'  transfers   '[]'  heat/ice application    '[]'   other:      Other Objective/Functional Measures: Tolerated exercises well per flow sheet. Slow progress with AROM.     Pain Level (0-10 scale) post treatment: 3/10    ASSESSMENT/Changes in Function:     Patient will continue to benefit from skilled PT services to modify and progress therapeutic interventions, address functional mobility deficits, address ROM deficits, address strength deficits, analyze and address soft tissue restrictions and analyze and cue movement patterns to attain remaining goals.     '[x]'   See Plan of Care  '[]'   See progress note/recertification  '[]'   See Discharge Summary         Progress towards goals / Updated goals:  1. Pt will increase FOTO to 50 to increase ease of ADLs - Met, 60%. 01/18/2014  2. Pt will demo full PROM in all planes to progress to strengthening phase Progressing- R Shoulder PROM flex 140, abd 100, IR 64, ER 65 degrees. 01/16/14  3. Pt will demo 110 degrees of assisted shoulder flexion to increase ease of ADLs. Met- R shldr AAROM flex= 130 01/16/14  Updated Goals: to be achieved in 4 weeks:  1. Pt will improve FOTO to 50 to increase ease of ADLs - Met, 51%. 01/04/2014  2. Pt will improve active right shoulder elevation to 90 degrees to increase ease of ADLs. Met- R shldr AROM flex= 120 01/16/14  3. Pt will improve right shoulder strength to 3/5 within available range to increase ease of ADLs. Not met- R shldr strength flex/abd measured at 3-/5 01/16/14      PLAN  '[]'   Upgrade activities as tolerated     '[x]'   Continue plan of care  '[]'   Update interventions per flow sheet       '[]'   Discharge due to:_  '[]'   Other:_      Lestine Box, PTA  01/23/2014  4:29 PM

## 2014-01-26 NOTE — Progress Notes (Signed)
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]    SUBJECTIVE  Pain Level (0-10 scale): 1/10  Any medication changes, allergies to medications, adverse drug reactions, diagnosis change, or new procedure performed?: _1  No    _2  Yes (see summary sheet for update)  Subjective functional status/changes:   _3  No changes reported    OBJECTIVE  Modality rationale:  to improve the patient???s ability to    Min Type Additional Details    _4  Estim: _5 Att   _6 Unatt        _7 TENS instruct                  _8 IFC  _9 Premod   _10 NMES                     _11 Other:  _12 w/US   _13 w/ice   _14 w/heat  Position:  Location:    _15   Traction: _16  Cervical       _17 Lumbar                       _18  Prone          _19 Supine                       _20 Intermittent   _21 Continuous Lbs:  _22  before manual  _23  after manual    _24   Ultrasound: _25 Continuous   _26  Pulsed                           _27 1MHz   _28 3MHz Location:  W/cm2:    _29   Iontophoresis with dexamethasone         Location: _30  Take home patch   _31  In clinic    _32   Ice     _33   heat  _34   Ice massage Position:  Location:    _35   Laser  _36   Other: Position:  Location:    _37   Vasopneumatic Device Pressure:       _38  lo _39  med _40  hi   Temperature: _41  lo _42  med _43  hi   _44  Skin assessment post-treatment:  _45 intact _46 redness- no adverse reaction    _47 redness ??? adverse reaction:     20 min Therapeutic Exercise:  _48  See flow sheet :   Rationale: increase ROM and increase strength to improve the patient???s ability to reach overhead with greater ease.     min Therapeutic Activity:  _49   See flow sheet :   Rationale:   to improve the patient???s ability to       min Neuromuscular Re-education:  _50   See flow sheet :   Rationale:   to improve the  patient???s ability to     8 min Manual Therapy:  PROM - flexion, abduction, ER, IR   Rationale: decrease pain, increase ROM and increase tissue extensibility to improve overhead reaching.     min Gait Training:  ___ feet with ___ device on level surfaces with ___ level of assist   Rationale:          With   _51  TE   _52   TA   _0  neuro   _1  other: Patient Education: _2  Review HEP    _3  Progressed/Changed HEP based on:   _4  positioning   _5  body mechanics   _6  transfers   <IRWERXVQMGQQPYPP>_5<\/KDTOIZTIWPYKDXIP>_3  heat/ice application    <ASNKNLZJQBHALPFX>_9<\/KWIOXBDZHGDJMEQA>_8  other:      Other Objective/Functional Measures: The patient is slowly progressing with PROM. Responded well with long axis traction due to experiencing spasms and distal referral pain to elbow.    Pain Level (0-10 scale) post treatment: 2/10    ASSESSMENT/Changes in Function: Continue POC.    Patient will continue to benefit from skilled PT services to modify and progress therapeutic interventions, address functional mobility deficits, address ROM deficits, address strength deficits, analyze and address soft tissue restrictions, analyze and cue movement patterns, analyze and modify body mechanics/ergonomics, assess and modify postural abnormalities and instruct in home and community integration to attain remaining goals.     _9   See Plan of Care  _10   See progress note/recertification  <TMHDQQIWLNLGXQJJ>_9<\/ERDEYCXKGYJEHUDJ>_49   See Discharge Summary         Progress towards goals / Updated goals:  1. Pt will increase FOTO to 50 to increase ease of ADLs - Met, 60%. 01/18/2014  2. Pt will demo full PROM in all planes to progress to strengthening phase Progressing- R Shoulder PROM flex 140, abd 100, IR 64, ER 65 degrees. 01/16/14  3. Pt will demo 110 degrees of assisted shoulder flexion to increase ease of ADLs. Met- R shldr AAROM flex= 130 01/16/14  Updated Goals: to be achieved in 4 weeks:  1. Pt will improve FOTO to 50 to increase ease of ADLs - Met, 51%. 01/04/2014  2. Pt will improve active right shoulder elevation to 90 degrees to increase ease of ADLs. Met- R  shldr AROM flex= 120 01/16/14  3. Pt will improve right shoulder strength to 3/5 within available range to increase ease of ADLs. Not met- R shldr strength flex/abd measured at 3-/5 01/16/14     PLAN  _12   Upgrade activities as tolerated     _13   Continue plan of care  _14   Update interventions per flow sheet       _15   Discharge due to:_  _16   Other:_      Gerilyn Nestle, PT 01/26/2014  4:34 PM

## 2014-01-30 NOTE — Progress Notes (Signed)
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]    SUBJECTIVE  Pain Level (0-10 scale): 1/10  Any medication changes, allergies to medications, adverse drug reactions, diagnosis change, or new procedure performed?:  No     Yes (see summary sheet for update)  Subjective functional status/changes:    No changes reported    OBJECTIVE    Modality rationale: PD   Min Type Additional Details     Estim: Att   Unatt        TENS instruct                  IFC  Premod   NMES                     Other:  w/US   w/ice   w/heat  Position:  Location:      Traction:  Cervical       Lumbar                        Prone          Supine                       Intermittent   Continuous Lbs:   before manual   after manual      Ultrasound: Continuous    Pulsed                           1MHz   3MHz Location:  W/cm2:      Iontophoresis with dexamethasone         Location:  Take home patch    In clinic      Ice       heat    Ice massage Position:  Location:      Laser    Other: Position:  Location:      Vasopneumatic Device Pressure:        lo  med  hi   Temperature:  lo  med  hi    Skin assessment post-treatment:  intact redness- no adverse reaction    redness ??? adverse reaction:     57 min Therapeutic Exercise:   See flow sheet : 40' billable   Rationale: increase ROM, increase strength and increase proprioception to improve the patient???s ability to improve functional activities.    8 min Manual Therapy:  STM/DTM (R) UT, lev scap, ST/GH joint mobs,    Rationale: decrease pain, increase ROM, increase tissue extensibility and decrease trigger points to improve OH activity tolerance.     min Gait Training:  ___ feet with ___ device on level surfaces with ___  level of assist   Rationale:          With    TE    TA    neuro    other: Patient Education:  Review HEP     Progressed/Changed HEP based on:    positioning    body mechanics    transfers    heat/ice application     other:  Other Objective/Functional Measures: Tolerated exercises well per flow sheet. (R) Shoulder PROM flex 154 and abd 159 degrees, AROM flex 125 and scap 125 degrees. (R) shldr strength flex/abd measured at 3/5.     Pain Level (0-10 scale) post treatment: 2/10    ASSESSMENT/Changes in Function:        See Plan of Care    See progress note/recertification    See Discharge Summary         Progress towards goals / Updated goals:  1. Pt will increase FOTO to 50 to increase ease of ADLs - Met, 60%. 01/18/2014  2. Pt will demo full PROM in all planes to progress to strengthening phase Progressing-  (R) Shoulder PROM flex 154 and abd 159 degrees 01/30/2014  3. Pt will demo 110 degrees of assisted shoulder flexion to increase ease of ADLs. Met- R shldr AAROM flex= 130 01/16/14  Updated Goals: to be achieved in 4 weeks:  1. Pt will improve FOTO to 50 to increase ease of ADLs - Met, 51%. 01/04/2014  2. Pt will improve active right shoulder elevation to 90 degrees to increase ease of ADLs. Met- AROM flex 125 and scap 125 degrees. 01/30/2014  3. Pt will improve right shoulder strength to 3/5 within available range to increase ease of ADLs. - Met,   (R) shldr strength flex/abd measured at 3/5. 01/30/2014     PLAN    Upgrade activities as tolerated       Continue plan of care    Update interventions per flow sheet         Discharge due to:_    Other:_      Lestine Box, PTA 01/30/2014  4:55 PM

## 2014-01-31 NOTE — Progress Notes (Addendum)
[facility] ??? 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 Had difficulty with active flexion and abduction prior to surgery secondary to severe pain   Visits from Start of Care: 29    Missed Visits: 0      Established Goals:        Excellent         Good         Limited            None   Increased ROM            Increased Strength           Increased Mobility            Decreased Pain             Key Functional Changes:  The patient has made slow progress toward AROM and strength. Due to insurance reasons, she will benefit from 1 additional visit in order to teach AROM progression.  Objective DATA: Tolerated exercises well per flow sheet. (R) Shoulder PROM flex 154 and abd 159 degrees, AROM flex 125 and scap 125 degrees. (R) shldr strength flex/abd measured at 3/5.     1. Pt will increase FOTO to 50 to increase ease of ADLs - Met, 60%. 01/18/2014  2. Pt will demo full PROM in all planes to progress to strengthening phase Progressing- (R) Shoulder PROM flex 154 and abd 159 degrees 01/30/2014  3. Pt will demo 110 degrees of assisted shoulder flexion to increase ease of ADLs. Met- R shldr AAROM flex= 130 01/16/14  Updated Goals: to be achieved in 4 weeks:  1. Pt will improve FOTO to 50 to increase ease of ADLs - Met, 51%. 01/04/2014  2. Pt will improve active right shoulder elevation to 90 degrees to increase ease of ADLs. Met- AROM flex 125 and scap 125 degrees. 01/30/2014   3. Pt will improve right shoulder strength to 3/5 within available range to increase ease of ADLs. - Met,   (R) shldr strength flex/abd measured at 3/5. 01/30/2014     Updated Goals: to be achieved in 1-2 weeks:  1. The patient will display independence with progressed updated HEP.  2. The patient will attain 135 degrees AROM in order to reach into overhead cabinets.    ASSESSMENT/RECOMMENDATIONS:  Continue therapy per initial plan/protocol at a frequency of  2 x per week for 2-3 weeks  Continue therapy with the following recommended changes:_____________________      _____________________________________________________________________  Discontinue therapy progressing towards or have reached established goals  Discontinue therapy due to lack of appreciable progress towards goals  Discontinue therapy due to lack of attendance or compliance  Await Physician's recommendations/decisions regarding therapy  Other:________________________________________________________________    Thank you for this referral.    Gerilyn Nestle, PT 01/31/2014 6:36 PM  NOTE TO PHYSICIAN:  PLEASE COMPLETE THE ORDERS BELOW AND   FAX TO [facility]: [phone]  If you are unable to process this request in  24 hours please contact our office: [phone]    ? I have read the above report and request that my patient continue as recommended.  ? I have read the above report and request that my patient continue therapy with the following changes/special instructions:__________________________________________________________  ? I have read the above report and request that my patient be discharged from therapy.    Physician???s signature: ______________________________Date: ______Time:______

## 2014-02-01 NOTE — Progress Notes (Signed)
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   Min Type Additional Details     Estim: Att   Unatt        TENS instruct                  IFC  Premod   NMES                     Other:  w/US   w/ice   w/heat  Position:  Location:      Traction:  Cervical       Lumbar                        Prone          Supine                       Intermittent   Continuous Lbs:   before manual   after manual      Ultrasound: Continuous    Pulsed                           1MHz   3MHz Location:  W/cm2:      Iontophoresis with dexamethasone         Location:  Take home patch    In clinic   5   Ice       heat    Ice massage Position: Seated  Location:(R) Shoulder      Laser    Other: Position:  Location:      Vasopneumatic Device Pressure:        lo  med  hi   Temperature:  lo  med  hi    Skin assessment post-treatment:  intact redness- no adverse reaction    redness ??? adverse reaction:     34 min Therapeutic Exercise:   See flow sheet :   Rationale: increase ROM, increase strength and increase proprioception to improve the patient???s ability to perform functional activities.    8 min Manual Therapy:  ST/GH joint mobs, PROM, STM/DTM (R) UT, lev scap, subscap, deltoid.   Rationale: decrease pain, increase ROM, increase tissue extensibility and  decrease trigger points to improve OH activity tolerance.     min Gait Training:  ___ feet with ___ device on level surfaces with ___ level of assist   Rationale:          With    TE    TA    neuro    other: Patient Education:  Review HEP     Progressed/Changed HEP based on:  positioning    body mechanics    transfers    heat/ice application     other:      Other Objective/Functional Measures: Tolerated exercises well per flow sheet. Increased wall letters to 1/2 prone, 1# to side-lying PRE's, added UBE level 1, hoop 1x and prone "w" 10x with swiss ball. Pt had discomfort/pain with new prone exercise and fatigue after UBE and hoop.    Pain Level (0-10 scale) post treatment: 0/10    ASSESSMENT/Changes in Function:     Patient will continue to benefit from skilled PT services to modify and progress therapeutic interventions, address functional mobility deficits, address ROM deficits, address strength deficits, analyze and address soft tissue restrictions and analyze and cue movement patterns to attain remaining goals.       See Plan of Care    See progress note/recertification    See Discharge Summary         Progress towards goals / Updated goals:  1. The patient will display independence with progressed updated HEP.  2. The patient will attain 135 degrees AROM in order to reach into overhead cabinets.    PLAN    Upgrade activities as tolerated       Continue plan of care    Update interventions per flow sheet         Discharge due to:_    Other:_      Lestine Box, PTA 02/01/2014  6:06 PM

## 2014-02-06 MED ORDER — GADOPENTETATE DIMEGLUMINE 10 MMOL/20 ML (469.01 MG/ML) IV
10 mmol/20 mL (469.01 mg/mL) | Freq: Once | INTRAVENOUS | Status: AC
Start: 2014-02-06 — End: 2014-02-06
  Administered 2014-02-06: 22:00:00 via INTRAVENOUS

## 2014-02-06 MED FILL — MAGNEVIST 10 MMOL/20 ML (469.01 MG/ML) INTRAVENOUS SOLUTION: 10 mmol/20 mL (469.01 mg/mL) | INTRAVENOUS | Qty: 15

## 2014-02-06 NOTE — Progress Notes (Signed)
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]    SUBJECTIVE  Pain Level (0-10 scale): 1/10  Any medication changes, allergies to medications, adverse drug reactions, diagnosis change, or new procedure performed?:  No     Yes (see summary sheet for update)  Subjective functional status/changes:    No changes reported    OBJECTIVE    Modality rationale: PD   Min Type Additional Details     Estim: Att   Unatt        TENS instruct                  IFC  Premod   NMES                     Other:  w/US   w/ice   w/heat  Position:  Location:      Traction:  Cervical       Lumbar                        Prone          Supine                       Intermittent   Continuous Lbs:   before manual   after manual      Ultrasound: Continuous    Pulsed                           1MHz   3MHz Location:  W/cm2:      Iontophoresis with dexamethasone         Location:  Take home patch    In clinic      Ice       heat    Ice massage Position:  Location:      Laser    Other: Position:  Location:      Vasopneumatic Device Pressure:        lo  med  hi   Temperature:  lo  med  hi    Skin assessment post-treatment:  intact redness- no adverse reaction    redness ??? adverse reaction:     29 min Therapeutic Exercise:   See flow sheet :   Rationale: increase ROM, increase strength and increase proprioception to improve the patient???s ability to perform functional activities.    8 min Manual Therapy:  ST/GH joint mobs, PROM, DTM/TPR (R) UT, lev scap, subscap.   Rationale: decrease pain, increase ROM, increase tissue extensibility and decrease trigger points to improve OH activity tolerance.     min Gait Training:  ___ feet with ___ device on level surfaces with ___  level of assist   Rationale:          With    TE    TA    neuro    other: Patient Education:  Review HEP     Progressed/Changed HEP based on:    positioning    body mechanics    transfers    heat/ice application     other:  Other Objective/Functional Measures: Performed exercises well per flow sheet. Pt stated "I think the next visit will be my last."    Pain Level (0-10 scale) post treatment: 2.5/10    ASSESSMENT/Changes in Function:     Patient will continue to benefit from skilled PT services to modify and progress therapeutic interventions, address functional mobility deficits, address ROM deficits, address strength deficits, analyze and address soft tissue restrictions and analyze and modify body mechanics/ergonomics to attain remaining goals.       See Plan of Care    See progress note/recertification    See Discharge Summary         Progress towards goals / Updated goals:  1. The patient will display independence with progressed updated HEP.  2. The patient will attain 135 degrees AROM in order to reach into overhead cabinets.    PLAN    Upgrade activities as tolerated       Continue plan of care    Update interventions per flow sheet         Discharge due to:_    Other:_      Lestine Box, PTA 02/06/2014  3:07 PM

## 2014-02-07 LAB — CREATININE, POC
Creatinine, POC: 0.9 MG/DL (ref 0.6–1.3)
GFRAA, POC: >60 mL/min/{1.73_m2} (ref >60)
GFRNA, POC: >60 mL/min/{1.73_m2} (ref >60)

## 2014-02-08 NOTE — Progress Notes (Signed)
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]    SUBJECTIVE  Pain Level (0-10 scale): 2/10  Any medication changes, allergies to medications, adverse drug reactions, diagnosis change, or new procedure performed?:  No     Yes (see summary sheet for update)  Subjective functional status/changes:    No changes reported  "I'm going to do one more week of therapy."    OBJECTIVE    Modality rationale: PD   Min Type Additional Details     Estim: Att   Unatt        TENS instruct                  IFC  Premod   NMES                     Other:  w/US   w/ice   w/heat  Position:  Location:      Traction:  Cervical       Lumbar                        Prone          Supine                       Intermittent   Continuous Lbs:   before manual   after manual      Ultrasound: Continuous    Pulsed                           1MHz   3MHz Location:  W/cm2:      Iontophoresis with dexamethasone         Location:  Take home patch    In clinic      Ice       heat    Ice massage Position:  Location:      Laser    Other: Position:  Location:      Vasopneumatic Device Pressure:        lo  med  hi   Temperature:  lo  med  hi    Skin assessment post-treatment:  intact redness- no adverse reaction    redness ??? adverse reaction:     30 min Therapeutic Exercise:   See flow sheet :   Rationale: increase ROM, increase strength and increase proprioception to improve the patient???s ability to perform ADL's.    8 min Manual Therapy:  ST/GH joint mobs, PROM, STM/DTM (R) UT, lev scap, subscap.   Rationale: decrease pain, increase ROM, increase tissue extensibility and decrease trigger points to perform functional activities.      min Gait Training:  ___ feet with ___ device on level surfaces with ___ level of assist   Rationale:          With    TE    TA    neuro    other: Patient Education:  Review HEP     Progressed/Changed HEP based on:    positioning    body mechanics    transfers    heat/ice  application     other:      Other Objective/Functional Measures: Tolerated exercises well per flow sheet. AROM (R) shoulder flex 140, abd 145 with significant scapular elevation.     Pain Level (0-10 scale) post treatment: 2/10    ASSESSMENT/Changes in Function:     Patient will continue to benefit from skilled PT services to modify and progress therapeutic interventions, address functional mobility deficits, address ROM deficits, address strength deficits, analyze and address soft tissue restrictions and analyze and modify body mechanics/ergonomics to attain remaining goals.       See Plan of Care    See progress note/recertification    See Discharge Summary         Progress towards goals / Updated goals:  1. The patient will display independence with progressed updated HEP. - Reports HEP compliance. 02/08/2014  2. The patient will attain 135 degrees AROM in order to reach into overhead cabinets. - Flex 140 degrees. 02/08/2014    PLAN    Upgrade activities as tolerated       Continue plan of care    Update interventions per flow sheet         Discharge due to:_    Other:_      Lestine Box, PTA 02/08/2014  5:02 PM

## 2014-04-07 ENCOUNTER — Encounter

## 2014-05-04 MED ORDER — SODIUM BICARBONATE 8.4 % IV
1 mEq/mL (8.4 %) | Freq: Once | INTRAVENOUS | Status: AC
Start: 2014-05-04 — End: 2014-05-04
  Administered 2014-05-04: 19:00:00 via SUBCUTANEOUS

## 2014-05-04 MED ORDER — SODIUM BICARBONATE 8.4 % IV
1 mEq/mL (8.4 %) | Freq: Once | INTRAVENOUS | Status: AC
Start: 2014-05-04 — End: 2014-05-05
  Administered 2014-05-04: 18:00:00 via SUBCUTANEOUS

## 2014-05-04 MED ORDER — METHYLPREDNISOLONE 40 MG/ML SUSP FOR INJECTION
40 mg/mL | Freq: Once | INTRAMUSCULAR | Status: AC
Start: 2014-05-04 — End: 2014-05-05
  Administered 2014-05-04: 18:00:00 via INTRA_ARTICULAR

## 2014-05-04 MED ORDER — LIDOCAINE (PF) 10 MG/ML (1 %) IJ SOLN
10 mg/mL (1 %) | Freq: Once | INTRAMUSCULAR | Status: AC
Start: 2014-05-04 — End: 2014-05-04
  Administered 2014-05-04: 19:00:00 via SUBCUTANEOUS

## 2014-05-04 MED ORDER — METHYLPREDNISOLONE 40 MG/ML SUSP FOR INJECTION
40 mg/mL | Freq: Once | INTRAMUSCULAR | Status: AC
Start: 2014-05-04 — End: 2014-05-04
  Administered 2014-05-04: 19:00:00 via INTRA_ARTICULAR

## 2014-05-04 MED ORDER — IOPAMIDOL 41 % INTRATHECAL
200 mg iodine /mL (41 %) | Freq: Once | INTRATHECAL | Status: AC
Start: 2014-05-04 — End: 2014-05-04
  Administered 2014-05-04: 19:00:00 via INTRA_ARTICULAR

## 2014-05-04 MED ORDER — LIDOCAINE (PF) 10 MG/ML (1 %) IJ SOLN
10 mg/mL (1 %) | Freq: Once | INTRAMUSCULAR | Status: AC
Start: 2014-05-04 — End: 2014-05-05
  Administered 2014-05-04: 18:00:00 via SUBCUTANEOUS

## 2014-05-04 MED FILL — LIDOCAINE (PF) 10 MG/ML (1 %) IJ SOLN: 10 mg/mL (1 %) | INTRAMUSCULAR | Qty: 30

## 2014-05-04 MED FILL — DEPO-MEDROL 40 MG/ML SUSPENSION FOR INJECTION: 40 mg/mL | INTRAMUSCULAR | Qty: 1

## 2014-05-04 MED FILL — ISOVUE-M 200  41 % INTRATHECAL SOLUTION: 200 mg iodine /mL (41 %) | INTRATHECAL | Qty: 20

## 2014-05-04 MED FILL — SODIUM BICARBONATE 8.4 % IV: 1 mEq/mL (8.4 %) | INTRAVENOUS | Qty: 50

## 2014-05-15 NOTE — Progress Notes (Signed)
[facility] ??? 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 Had difficulty with active flexion and abduction prior to surgery secondary to severe pain     Visits from Start of Care: 32    Missed Visits: 2  Reporting Period : 01/31/2014 to 02/08/2014      Summary of Care:  AROM (R) shoulder flex 140, abd 145 with significant scapular elevation.     Progress towards goals:  1. The patient will display independence with progressed updated HEP. - Reports HEP compliance. 02/08/2014  2. The patient will attain 135 degrees AROM in order to reach into overhead cabinets. - Flex 140 degrees. 02/08/2014      ASSESSMENT/RECOMMENDATIONS:  Discontinue therapy: Patient has reached or is progressing toward set goals      Patient is non-compliant or has abdicated      Due to lack of appreciable progress towards set Graham, PT 05/15/2014 1:47 PM

## 2014-05-26 NOTE — Telephone Encounter (Signed)
LMOVM for patient to call me back.

## 2014-05-26 NOTE — Telephone Encounter (Signed)
I will not fill prescription. She got 100 tramadol with 2 refills from [place] on 9/9

## 2014-05-26 NOTE — Telephone Encounter (Signed)
Patient states that both hips are hurting and she is going on vacation next and will need if she does a lot of walking.    Last Visit: 04/04/2014   Next Appointment: none (F/U on prn basis)  Previous Refill Requests: Per [facility], last written by MD Domingo Pulse #[phone]    Pharmacy confirmed    Requested Prescriptions     Pending Prescriptions Disp Refills   ??? HYDROcodone-acetaminophen (NORCO) 5-325 mg per tablet 40 Tab 0     Sig: 1 po q 6-8 hrs prn pain

## 2014-05-31 ENCOUNTER — Encounter: Attending: Orthopaedic Surgery | Primary: Family Medicine

## 2014-05-31 ENCOUNTER — Encounter
Admit: 2014-05-31 | Discharge: 2014-05-31 | Payer: PRIVATE HEALTH INSURANCE | Attending: Orthopaedic Surgery | Primary: Family Medicine

## 2014-06-26 ENCOUNTER — Ambulatory Visit: Admit: 2014-06-26 | Payer: PRIVATE HEALTH INSURANCE | Attending: Orthopaedic Surgery | Primary: Family Medicine

## 2014-06-26 DIAGNOSIS — M24159 Other articular cartilage disorders, unspecified hip: Secondary | ICD-10-CM

## 2014-06-26 NOTE — Progress Notes (Signed)
Patient here today for follow on hips and right shoulder. Injection in right hip didn't help at all. Right shoulder is doing better since last visit.

## 2014-06-26 NOTE — Progress Notes (Signed)
Kestrel Mis [facility]  [birth year]   Chief Complaint   Patient presents with   ??? Hip Pain     bilateral        HISTORY OF PRESENT ILLNESS  Cassandra Daniels is a 53 y.o. female who presents today for reevaluation of bilateral hip pain. The patient had an episode of pain in the right hip last week, but it improved over the next few days.  Her left hip appears to be having some difficulty at times.          Allergies   Allergen Reactions   ??? Beta Blocker [Beta-Blockers (Beta-Adrenergic Blocking Agts)] Other (comments)     Fatigue   ??? Tetracycline Hives        Past Medical History   Diagnosis Date   ??? Arthritis    ??? Fibrocystic breast    ??? Mitral (valve) prolapse    ??? Chronic fatigue    ??? Vitamin B 12 deficiency    ??? HTN    ??? Meningioma nos    ??? Broken nose 1999     surgery   ??? History of echocardiogram 05/24/2010     EF 60-65%.  Increased wall thickness.  Normal diastolic function.  RVSP 25-30 mmHg.     ??? Myocardial perfusion 06/21/2004     No evidence of scarring or ischemia.  EF >80%.    ??? Essential hypertension    ??? MVP (mitral valve prolapse)    ??? Pancreatitis 2014   ??? Unspecified sleep apnea      uses cpap machine   ??? Unspecified adverse effect of anesthesia      difficulty waking up    ??? Paroxysmal atrial tachycardia ([place])    ??? Atrial fibrillation ([place])    ??? SVT (supraventricular tachycardia) ([place])    ??? PVC (premature ventricular contraction)    ??? Bilateral knee pain    ??? Hypothyroidism    ??? Atherosclerotic cardiovascular disease    ??? Carpal tunnel syndrome    ??? Left hip pain    ??? Right shoulder pain    ??? Right hip pain    ??? Left groin pain       History     Social History   ??? Marital Status: MARRIED     Spouse Name: N/A     Number of Children: N/A   ??? Years of Education: N/A     Occupational History   ??? Not on file.     Social History Main Topics   ??? Smoking status: Never Smoker    ??? Smokeless tobacco: Never Used   ??? Alcohol Use: No   ??? Drug Use: Yes     Special: Prescription, OTC    ??? Sexual Activity: Not on file     Other Topics Concern   ??? Not on file     Social History Narrative      Past Surgical History   Procedure Laterality Date   ??? Hx knee arthroscopy     ??? Hx rhinoplasty     ??? Hx hysterectomy     ??? 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 Hx bladder repair  1996   ??? Ablation of svt  2007     by Dr. Serita Sheller   ??? Mam biopsy breast stereotactic  april, 2013     right, benign   ??? Hx cholecystectomy  2014   ??? Hx shoulder arthroscopy        Family History  Problem Relation Age of Onset   ??? Asthma Mother    ??? Breast Cancer Mother 36   ??? Cancer Mother    ??? Other Father      cabg   ??? Heart Disease Father    ??? High Cholesterol Father    ??? Heart Attack Maternal Grandmother 96   ??? Breast Cancer Maternal Grandmother 14      Current Outpatient Prescriptions   Medication Sig   ??? venlafaxine-SR (EFFEXOR XR) 150 mg capsule Take  by mouth daily.   ??? traMADol (ULTRAM) 50 mg tablet    ??? HYDROcodone-acetaminophen (NORCO) 7.5-325 mg per tablet    ??? ascorbic acid (VITAMIN C) 250 mg tablet Take 250 mg by mouth daily.   ??? ferrous sulfate (IRON) 325 mg (65 mg iron) tablet Take 325 mg by mouth Daily (before breakfast).   ??? BENAZEPRIL HCL (LOTENSIN PO) Take 5 mg by mouth daily.   ??? estrogens, conjugated,-methylTESTOSTERone (ESTRATEST HS) 0.625-1.25 mg per tablet Take 1 Tab by mouth daily.   ??? primidone (MYSOLINE) 50 mg tablet TAKE 1 TABLET BY MOUTH EVERY MORNING AND 2 TABLETS BY MOUTH EVERY NIGHT AT BEDTIME   ??? hyoscyamine SL (LEVSIN/SL) 0.125 mg SL tablet Take 1 Tab by mouth every six (6) hours as needed for Cramping.   ??? Cholecalciferol, Vitamin D3, (VITAMIN D) 1,000 unit Cap Take 1,000 Int'l Units by mouth two (2) times a day.   ??? CYANOCOBALAMIN, VITAMIN B-12, (VITAMIN B-12 PO) Take 1 Tab by mouth daily.   ??? buPROPion SR (WELLBUTRIN SR) 150 mg SR tablet Take 150 mg by mouth two (2) times a day.   ??? clonazePAM (KLONOPIN) 0.5 mg tablet Take 1 Tab by mouth nightly as needed.      No current facility-administered medications for this visit.       REVIEW OF SYSTEM   Patient denies: Weight loss, Fever/Chills, HA, Visual changes, Fatigue, Chest pain, SOB, Abdominal pain, N/V/D/C, Blood in stool or urine, Edema.   Pertinent positive as above in HPI. All others were negative    PHYSICAL EXAM:   BP 119/82 mmHg   Pulse 106   Temp(Src) 98 ??F (36.7 ??C)   Ht 5' 2.5" (1.588 m)   Wt 174 lb (78.926 kg)   BMI 31.30 kg/m2  The patient is a well-developed, well-nourished female   in no acute distress.  The patient is alert and oriented times three.  The patient is alert and oriented times three. Mood and affect are normal.  LYMPHATIC: lymph nodes are not enlarged and are within normal limits  SKIN: normal in color and non tender to palpation. There are no bruises or abrasions noted.   NEUROLOGICAL: Motor sensory exam is within normal limits. Reflexes are equal bilaterally. There is normal sensation to pinprick and light touch  MUSCULOSKELETAL:  Examination Left hip Right hip   Skin Intact Intact   Flexion ROM 90 90   Extension ROM 20 20   External Rotation ROM 45 45   Internal Rotation ROM 20 20   Abduction ROM 45 45   Adduction ROM 30 30   FADDIR - -   FABER - -   Log roll test - -   Trochanteric tenderness - -   Ober test - -   Neurovascular Intact Intact         PROCEDURE: none    IMAGING: none    IMPRESSION:      ICD-10-CM ICD-9-CM    1. 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  So, we will continue symptomatic treatment for now.        Follow-up Disposition:  Return if symptoms worsen or fail to improve.    Scribed by Marjie Skiff ([place]) as dictated by Richardo Hanks, MD    Richardo Hanks, M.D.   [facility]

## 2014-08-31 ENCOUNTER — Encounter

## 2014-09-12 ENCOUNTER — Ambulatory Visit: Payer: PRIVATE HEALTH INSURANCE | Primary: Family Medicine

## 2014-09-15 ENCOUNTER — Inpatient Hospital Stay: Admit: 2014-09-15 | Payer: PRIVATE HEALTH INSURANCE | Attending: Gastroenterology | Primary: Family Medicine

## 2014-09-15 DIAGNOSIS — E7220 Disorder of urea cycle metabolism, unspecified: Secondary | ICD-10-CM

## 2014-09-18 ENCOUNTER — Ambulatory Visit
Admit: 2014-09-18 | Discharge: 2014-09-18 | Payer: PRIVATE HEALTH INSURANCE | Attending: Orthopaedic Surgery | Primary: Family Medicine

## 2014-09-18 DIAGNOSIS — M25561 Pain in right knee: Secondary | ICD-10-CM

## 2014-09-18 MED ORDER — LIDOCAINE HCL 1 % (10 MG/ML) IJ SOLN
10 mg/mL (1 %) | Freq: Once | INTRAMUSCULAR | Status: AC
Start: 2014-09-18 — End: 2014-09-18

## 2014-09-18 MED ORDER — TRIAMCINOLONE ACETONIDE 40 MG/ML SUSP FOR INJECTION
40 mg/mL | Freq: Once | INTRAMUSCULAR | Status: AC
Start: 2014-09-18 — End: 2014-09-18

## 2014-09-18 NOTE — Patient Instructions (Signed)
Knee Pain: Care Instructions  Your Care Instructions     Overuse is often the cause of knee pain. Other causes are climbing stairs, kneeling, or other activities that use the knee. Everyday wear and tear, especially as you get older, also can cause knee pain.  Rest, along with home treatment, often relieves pain and allows your knee to heal. If you have a serious knee injury, you may need tests and treatment.  Follow-up care is a key part of your treatment and safety. Be sure to make and go to all appointments, and call your doctor if you are having problems. It???s also a good idea to know your test results and keep a list of the medicines you take.  How can you care for yourself at home?  ?? Take pain medicines exactly as directed.  ?? If the doctor gave you a prescription medicine for pain, take it as prescribed.  ?? If you are not taking a prescription pain medicine, ask your doctor if you can take an over-the-counter medicine.  ?? Do not take two or more pain medicines at the same time unless the doctor told you to. Many pain medicines contain acetaminophen, which is Tylenol. Too much acetaminophen (Tylenol) can be harmful.  ?? Rest and protect your knee. Take a break from any activity that may cause pain.  ?? Put ice or a cold pack on your knee for 10 to 20 minutes at a time. Put a thin cloth between the ice and your skin.  ?? Prop up a sore knee on a pillow when you ice it or anytime you sit or lie down for the next 3 days. Try to keep it above the level of your heart. This will help reduce swelling.  ?? If your doctor recommends an elastic bandage, sleeve, or other type of support for your knee, wear it as directed.  ?? If your knee is not swollen, you can put moist heat, a heating pad, or a warm cloth on your knee.  ?? After several days of rest, you can begin gentle exercise of your knee.  ?? Reach and stay at a healthy weight. Extra weight can strain the joints,  especially the knees and hips, and make the pain worse. Losing even a few pounds may help.  When should you call for help?  Call 911 anytime you think you may need emergency care. For example, call if:  ?? You have sudden chest pain and shortness of breath, or you cough up blood.  Call your doctor now or seek immediate medical care if:  ?? You have severe or increasing pain.  ?? Your leg or foot turns cold or changes color.  ?? You cannot stand or put weight on your knee.  ?? Your knee looks twisted or bent out of shape.  ?? You cannot move your knee.  ?? You have signs of infection, such as:  ?? Increased pain, swelling, warmth, or redness.  ?? Red streaks leading from the sore area.  ?? Pus draining from a place on your knee.  ?? A fever.  ?? You have signs of a blood clot in your leg, such as:  ?? Pain in your calf, back of the knee, thigh, or groin.  ?? Redness and swelling in your leg or groin.  Watch closely for changes in your health, and be sure to contact your doctor if:  ?? Your knee feels numb or tingly.  ?? You do not get better as expected.  ??   You have any new symptoms, such as swelling.  ?? You have bruises from a knee injury that last longer than 2 weeks.   Where can you learn more?   Go to http://www.healthwise.net/[facility]  Enter K195 in the search box to learn more about "Knee Pain: Care Instructions."   ?? 2006-2015 Healthwise, Incorporated. Care instructions adapted under license by [employer] (which disclaims liability or warranty for this information). This care instruction is for use with your licensed healthcare professional. If you have questions about a medical condition or this instruction, always ask your healthcare professional. Healthwise, Incorporated disclaims any warranty or liability for your use of this information.  Content Version: 10.7.482551; Current as of: June 24, 2013

## 2014-09-18 NOTE — Progress Notes (Signed)
PATIENT PRESENTS FOR RIGHT KNEE PAIN 5/10.

## 2014-09-18 NOTE — Progress Notes (Signed)
Patient: Cassandra Daniels                MRN: [id number]       SSN: [id number]  Date of Birth: Jun 24, 1961        AGE: 54 y.o.        SEX: female  There is no weight on file to calculate BMI.    PCP: Georgianne Fick, MD  09/18/2014      No chief complaint on file.      HISTORY OF PRESENT ILLNESS: Cassandra Daniels is a 54 y.o. female who presents to the office for R knee pain.    Review of Systems   Constitutional: Negative.    HENT: Negative.    Eyes: Negative.    Respiratory: Negative.    Cardiovascular: Negative.    Gastrointestinal: Negative.    Genitourinary: Negative.    Musculoskeletal: Positive for joint pain (R knee).   Skin: Negative.    Neurological: Negative.    Endo/Heme/Allergies: Negative.    Psychiatric/Behavioral: Negative.            History     Social History   ??? Marital Status: MARRIED     Spouse Name: N/A     Number of Children: N/A   ??? Years of Education: N/A     Occupational History   ??? Not on file.     Social History Main Topics   ??? Smoking status: Never Smoker    ??? Smokeless tobacco: Never Used   ??? Alcohol Use: No   ??? Drug Use: Yes     Special: Prescription, OTC   ??? Sexual Activity: Not on file     Other Topics Concern   ??? Not on file     Social History Narrative        Past Medical History   Diagnosis Date   ??? Arthritis    ??? Fibrocystic breast    ??? Mitral (valve) prolapse    ??? Chronic fatigue    ??? Vitamin B 12 deficiency    ??? HTN    ??? Meningioma nos    ??? Broken nose 1999     surgery   ??? History of echocardiogram 05/24/2010     EF 60-65%.  Increased wall thickness.  Normal diastolic function.  RVSP 25-30 mmHg.     ??? Myocardial perfusion 06/21/2004     No evidence of scarring or ischemia.  EF >80%.    ??? Essential hypertension    ??? MVP (mitral valve prolapse)    ??? Pancreatitis 2014   ??? Unspecified sleep apnea      uses cpap machine   ??? Unspecified adverse effect of anesthesia      difficulty waking up    ??? Paroxysmal atrial tachycardia ([place])     ??? Atrial fibrillation ([place])    ??? SVT (supraventricular tachycardia)    ??? PVC (premature ventricular contraction)    ??? Bilateral knee pain    ??? Hypothyroidism    ??? Atherosclerotic cardiovascular disease    ??? Carpal tunnel syndrome    ??? Left hip pain    ??? Right shoulder pain    ??? Right hip pain    ??? Left groin pain         Allergies   Allergen Reactions   ??? Beta Blocker [Beta-Blockers (Beta-Adrenergic Blocking Agts)] Other (comments)     Fatigue   ??? Tetracycline Hives         Current Outpatient Prescriptions  Medication Sig   ??? venlafaxine-SR (EFFEXOR XR) 150 mg capsule Take  by mouth daily.   ??? traMADol (ULTRAM) 50 mg tablet    ??? HYDROcodone-acetaminophen (NORCO) 7.5-325 mg per tablet    ??? buPROPion SR (WELLBUTRIN SR) 150 mg SR tablet Take 150 mg by mouth two (2) times a day.   ??? ascorbic acid (VITAMIN C) 250 mg tablet Take 250 mg by mouth daily.   ??? ferrous sulfate (IRON) 325 mg (65 mg iron) tablet Take 325 mg by mouth Daily (before breakfast).   ??? BENAZEPRIL HCL (LOTENSIN PO) Take 5 mg by mouth daily.   ??? estrogens, conjugated,-methylTESTOSTERone (ESTRATEST HS) 0.625-1.25 mg per tablet Take 1 Tab by mouth daily.   ??? primidone (MYSOLINE) 50 mg tablet TAKE 1 TABLET BY MOUTH EVERY MORNING AND 2 TABLETS BY MOUTH EVERY NIGHT AT BEDTIME   ??? clonazePAM (KLONOPIN) 0.5 mg tablet Take 1 Tab by mouth nightly as needed.   ??? hyoscyamine SL (LEVSIN/SL) 0.125 mg SL tablet Take 1 Tab by mouth every six (6) hours as needed for Cramping.   ??? Cholecalciferol, Vitamin D3, (VITAMIN D) 1,000 unit Cap Take 1,000 Int'l Units by mouth two (2) times a day.   ??? CYANOCOBALAMIN, VITAMIN B-12, (VITAMIN B-12 PO) Take 1 Tab by mouth daily.     No current facility-administered medications for this visit.        Physical Exam  Constitutional: she is oriented to person, place, and time and well-developed, well-nourished, and in no distress. No distress.   HENT:   Head: Normocephalic and atraumatic.   Right Ear: Hearing normal.    Left Ear: Hearing normal.   Nose: Nose normal.   Eyes: Conjunctivae, EOM and lids are normal. Pupils are equal, round, and reactive to light.   Neck: Trachea normal.   Pulmonary/Chest: Effort normal and breath sounds normal. No respiratory distress.   Abdominal: Soft.   Neurological: she is alert and oriented to person, place, and time.   Skin: Skin is warm, dry and intact. she is not diaphoretic.   Psychiatric: Affect normal.   Nursing note and vitals reviewed.    Ortho Exam  R knee- No swelling, redness, or increased warmth of R knee.  Sensation, circulation, ROM, and motor strength are normal for R knee. No effusion.  Collateral and cruciate ligaments intact.  Performance of deep knee bend did not cause pain and despite previously known chondromalacia of patellofemoral joint there was no crepitus at the patellofemoral joint with deep knee bend.    Procedure: After timeout and under sterile conditions, patient's R knee was injected with 6 cc of Xylocaine and 1.5 cc of Kenalog.    [place]  OFFICE PROCEDURE PROGRESS NOTE        Chart reviewed for the following:   I, Darrin Nipper, MD, have reviewed the History, Physical and updated the Allergic reactions for [place] performed immediately prior to start of procedure:   I, Darrin Nipper, MD, have performed the following reviews on Shantese Raven prior to the start of the procedure:            * Patient was identified by name and date of birth   * Agreement on procedure being performed was verified  * Risks and Benefits explained to the patient  * Procedure site verified and marked as necessary  * Patient was positioned for comfort  * Consent  was signed and verified     Time: 11:10 AM       Date of procedure: 09/18/2014    Procedure performed by:  Darrin Nipper, MD    Provider assisted by: None     Patient assisted by: self     How tolerated by patient: tolerated the procedure well with no complications    Comments: none     RADIOGRAPHS: Previous x-rays unremarkable for any joint space narrowing of either compartment of the symptomatic R knee and asymptomatic L knee.    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 I will give cortisone injection in R knee with Pt permissoin. I will see her back PRN.      Scribed by Marikay Alar ([place]) as dictated by Cipriano Bunker, MD.

## 2014-10-12 ENCOUNTER — Ambulatory Visit
Admit: 2014-10-12 | Discharge: 2014-10-12 | Payer: PRIVATE HEALTH INSURANCE | Attending: Internal Medicine | Primary: Family Medicine

## 2014-10-12 DIAGNOSIS — R002 Palpitations: Secondary | ICD-10-CM

## 2014-10-12 NOTE — Progress Notes (Signed)
History of Present Illness:   This is a 54 year-old female here for followup.  I last saw her in 01/2012.  At that time, she had some palpitations and presyncope.  Holter monitor showed PVCs at less than 1% burden.  She recently saw Dr. Marianna Fuss and there was concern for repeating another Holter monitor.  She had some funny spells and fatigue in 08/2014.  She actually had her ammonia levels checked and they were elevated.  It was unclear the exact etiology.  Ultrasound of the liver was unremarkable by report.  She does not have any significant dyspnea or chest pain.  She continues to have ongoing palpitations, which are really unchanged she tells me over the past ten years.  She also tells me that at least on two episodes she had a low blood pressure of unclear reasons.  She does take low dose ACE inhibitor, as well as Ultram and Hydrocodone and Klonopin intermittently.      Impression:   1. Recent low blood pressure, as well as increased ammonia levels of unclear etiology.    2. History of recurrent palpitations with PVCs by Holter monitor in 03/2012.  She tells me that her palpitations really are unchanged over the past ten years.    3. History of labile blood pressure.    4. History of mitral valve prolapse with echocardiogram in 2011 without any significant regurgitation.   5. Chronic pain on multiple medications.      Recommendations:  At this point, I discussed her labile blood pressure and the need to stay hydrated and close monitoring.  If her blood pressure continues to run on the low side, I would go ahead and stop her ACE inhibitor.     I discussed potentially repeating another Holter monitor, but she tells me that her palpitations are really unchanged over the past ten years and she does not seem too concerned.  What she is describing seems very much like PVCs.  At this point, we will hold off and continue to monitor.      In regards to her mitral valve prolapse; however, given the fact that she  could develop regurgitation and her vague symptoms over the past few months, I recommended repeating to make sure there is no evidence of worsening prolapse or regurgitation.  I will tentatively see her back in a year.  All questions were answered.         Past Medical History   Diagnosis Date   ??? Arthritis    ??? Fibrocystic breast    ??? Mitral (valve) prolapse    ??? Chronic fatigue    ??? Vitamin B 12 deficiency    ??? HTN    ??? Meningioma nos    ??? Broken nose 1999     surgery   ??? Essential hypertension    ??? MVP (mitral valve prolapse)    ??? Pancreatitis 2014   ??? Unspecified sleep apnea      uses cpap machine   ??? Unspecified adverse effect of anesthesia      difficulty waking up    ??? Paroxysmal atrial tachycardia ([place])    ??? Atrial fibrillation ([place])    ??? SVT (supraventricular tachycardia)    ??? PVC (premature ventricular contraction)    ??? Bilateral knee pain    ??? Hypothyroidism    ??? Atherosclerotic cardiovascular disease    ??? Carpal tunnel syndrome    ??? Left hip pain    ??? Right shoulder pain    ???  Right hip pain    ??? Left groin pain    ??? Echocardiogram 05/24/2010     EF 60-65%.  Increased wall thickness.  Normal diastolic function.  RVSP 25-30 mmHg.     ??? Holter monitor study 03/24/2012     Sinus rhythm, avg HR 95 bpm.  Rare PVCs.  No sustained arrhythmias, heart block, or pauses.   ??? Normal nuclear cardiac imaging test 07/07/2012     Low risk.  No ischemia or prior infarction.  EF >80%.  No WMA.  Neg EKG on pharm stress test.       Current Outpatient Prescriptions   Medication Sig Dispense Refill   ??? venlafaxine-SR (EFFEXOR XR) 150 mg capsule Take  by mouth daily.     ??? traMADol (ULTRAM) 50 mg tablet   0   ??? HYDROcodone-acetaminophen (NORCO) 7.5-325 mg per tablet   0   ??? ascorbic acid (VITAMIN C) 250 mg tablet Take 250 mg by mouth daily.     ??? ferrous sulfate (IRON) 325 mg (65 mg iron) tablet Take 325 mg by mouth Daily (before breakfast).     ??? BENAZEPRIL HCL (LOTENSIN PO) Take 5 mg by mouth daily.      ??? estrogens, conjugated,-methylTESTOSTERone (ESTRATEST HS) 0.625-1.25 mg per tablet Take 1 Tab by mouth daily.     ??? primidone (MYSOLINE) 50 mg tablet TAKE 1 TABLET BY MOUTH EVERY MORNING AND 2 TABLETS BY MOUTH EVERY NIGHT AT BEDTIME 270 Tab 3   ??? clonazePAM (KLONOPIN) 0.5 mg tablet Take 1 Tab by mouth nightly as needed. 90 Tab 0   ??? hyoscyamine SL (LEVSIN/SL) 0.125 mg SL tablet Take 1 Tab by mouth every six (6) hours as needed for Cramping. 30 Tab 3   ??? Cholecalciferol, Vitamin D3, (VITAMIN D) 1,000 unit Cap Take 1,000 Int'l Units by mouth two (2) times a day.     ??? CYANOCOBALAMIN, VITAMIN B-12, (VITAMIN B-12 PO) Take 1 Tab by mouth daily.     ??? buPROPion SR (WELLBUTRIN SR) 150 mg SR tablet Take 150 mg by mouth two (2) times a day.         Social History   reports that she has never smoked. She has never used smokeless tobacco.   reports that she does not drink alcohol.    Family History  family history includes Asthma in her mother; Breast Cancer (age of onset: 18) in her maternal grandmother; Breast Cancer (age of onset: 81) in her mother; Cancer in her mother; Heart Attack (age of onset: 55) in her maternal grandmother; Heart Disease in her father; High Cholesterol in her father; Other in her father.    Review of Systems  Except as stated above include:  Constitutional: Negative for fever, chills and malaise/fatigue.   HEENT: Negative.   Gastrointestinal: Negative.  Musculoskeletal: Negative.  Neurological:  No localized symptoms.    PHYSICAL EXAM  BP Readings from Last 3 Encounters:   10/12/14 120/72   09/18/14 102/78   06/26/14 119/82     Pulse Readings from Last 3 Encounters:   10/12/14 104   09/18/14 85   06/26/14 106     Wt Readings from Last 3 Encounters:   10/12/14 79.379 kg (175 lb)   09/18/14 77.111 kg (170 lb)   06/26/14 78.926 kg (174 lb)     General:   Well developed, well groomed.    Head/Neck:   No jugular venous distention     No carotid bruits.    No  evidence of xanthelasma.   Lungs:   No respiratory distress.      Clear bilaterally.  Heart:   Regular rate and rhythm.  Normal S1/S2.      Palpation of heart with normal point of maximum impulse.    No significant murmurs, rubs or gallops.  Abdomen:   Soft and nontender.      No palpable abdominal mass or bruits.  Extremities:   Intact peripheral pulses.      No significant edema.    Neurological:   Alert and oriented to person, place, time.      No focal neurological deficit visually.

## 2014-10-12 NOTE — Progress Notes (Signed)
1. Have you been to the ER, urgent care clinic since your last visit?  Hospitalized since your last visit? No     2. Have you seen or consulted any other health care providers outside of the [facility] since your last visit?  Include any pap smears or colon screening. No

## 2014-10-13 MED ORDER — TRAMADOL 50 MG TAB
50 mg | ORAL_TABLET | Freq: Three times a day (TID) | ORAL | Status: DC | PRN
Start: 2014-10-13 — End: 2014-11-14

## 2014-10-13 NOTE — Telephone Encounter (Signed)
RX called in and patient contacted.

## 2014-10-13 NOTE — Telephone Encounter (Signed)
Please call in per jrm

## 2014-10-13 NOTE — Telephone Encounter (Signed)
PHONE IN RX    Last Visit: 09/18/2014 with MD Wynetta Emery    Next Appointment: noted to f/u prn   Previous Refill Encounters: 05/31/2014 per MD [place] #[phone]     Requested Prescriptions     Pending Prescriptions Disp Refills   ??? traMADol (ULTRAM) 50 mg tablet 100 Tab 0     Sig: Take 1-2 Tabs by mouth three (3) times daily as needed for Pain. Max Daily Amount: 300 mg.

## 2014-10-17 ENCOUNTER — Inpatient Hospital Stay: Admit: 2014-10-17 | Payer: PRIVATE HEALTH INSURANCE | Attending: Internal Medicine | Primary: Family Medicine

## 2014-10-17 DIAGNOSIS — I341 Nonrheumatic mitral (valve) prolapse: Secondary | ICD-10-CM

## 2014-10-17 NOTE — Progress Notes (Signed)
Echocardiogram completed. Report to follow. Armband removed and shredded.

## 2014-10-18 NOTE — Telephone Encounter (Addendum)
Pt aware.Cassandra Albee, MA      ----- Message from Geoffery Lyons, MD sent at 10/18/2014 12:00 PM EST -----  Please let her know echo is fine.  Normal function and no leaky valve.  thx    ----- Message -----     From: Florian Buff     Sent: 10/18/2014  10:29 AM       To: Geoffery Lyons, MD    Echo done for MVP and PVC's.Cassandra Albee, MA

## 2014-10-18 NOTE — Progress Notes (Signed)
Quick Note:        Echo done for MVP and PVC's.Rosealee Albee, MA        ______

## 2014-11-14 MED ORDER — TRAMADOL 50 MG TAB
50 mg | ORAL_TABLET | Freq: Three times a day (TID) | ORAL | Status: DC | PRN
Start: 2014-11-14 — End: 2015-03-12

## 2014-11-14 NOTE — Telephone Encounter (Signed)
Approved; may call in

## 2014-11-14 NOTE — Telephone Encounter (Signed)
PHONE IN RX    Last Visit: 09/18/2014 with MD Wynetta Emery    Next Appointment: noted to f/u prn   Previous Refill Encounters: 10/13/2014 per PA Sabra Heck #[id number]     Requested Prescriptions     Pending Prescriptions Disp Refills   ??? traMADol (ULTRAM) 50 mg tablet 60 Tab 0     Sig: Take 1-2 Tabs by mouth three (3) times daily as needed for Pain. Max Daily Amount: 300 mg.

## 2014-11-14 NOTE — Telephone Encounter (Signed)
GAMION FROM [facility] CALLED TO VERIFY THE PATIENT RX DIRECTIONS FOR TRAMADOL 50 MG. PLEASE ADVISE.

## 2014-11-14 NOTE — Telephone Encounter (Signed)
Spoke with Megan at [facility] and verified tramadol 50 mg 1-2 po tid prn pain #60 no refills written on 11-14-14.

## 2014-11-14 NOTE — Telephone Encounter (Signed)
Rx. Called in to [facility] today

## 2014-12-06 ENCOUNTER — Encounter: Attending: Orthopaedic Surgery | Primary: Family Medicine

## 2014-12-12 ENCOUNTER — Ambulatory Visit
Admit: 2014-12-12 | Discharge: 2014-12-12 | Payer: PRIVATE HEALTH INSURANCE | Attending: Orthopaedic Surgery | Primary: Family Medicine

## 2014-12-12 DIAGNOSIS — M1811 Unilateral primary osteoarthritis of first carpometacarpal joint, right hand: Secondary | ICD-10-CM

## 2014-12-12 MED ORDER — HYDROCODONE-ACETAMINOPHEN 5 MG-325 MG TAB
5-325 mg | ORAL_TABLET | Freq: Three times a day (TID) | ORAL | Status: DC | PRN
Start: 2014-12-12 — End: 2016-01-17

## 2014-12-12 MED ORDER — BETAMETHASONE ACET & SOD PHOS 6 MG/ML SUSP FOR INJECTION
6 mg/mL | Freq: Once | INTRAMUSCULAR | Status: AC
Start: 2014-12-12 — End: 2014-12-12

## 2014-12-12 MED ORDER — TRAMADOL 50 MG TAB
50 mg | ORAL_TABLET | Freq: Four times a day (QID) | ORAL | Status: DC | PRN
Start: 2014-12-12 — End: 2015-01-30

## 2014-12-12 MED ORDER — LIDOCAINE HCL 1 % (10 MG/ML) IJ SOLN
10 mg/mL (1 %) | Freq: Once | INTRAMUSCULAR | Status: AC
Start: 2014-12-12 — End: 2014-12-12

## 2014-12-12 NOTE — Progress Notes (Signed)
Chief Complaint   Patient presents with   ??? Hand Pain     right   ??? Knee Pain     right   ??? Hip Pain     bilat

## 2014-12-12 NOTE — Progress Notes (Signed)
Patient: Cassandra Daniels                MRN: [id number]       SSN: [id number]  Date of Birth: [birth year]        AGE: 54 y.o.        SEX: female  Body mass index is 32 kg/(m^2).    PCP: Georgianne Fick, MD  12/12/2014      Chief Complaint   Patient presents with   ??? Hand Pain     right       HISTORY OF PRESENT ILLNESS: Cassandra Daniels is a 54 y.o. female who presents to the office for R hand pain (6/10). Pt has had recurrent episodes of pain at the [facility] joint of her R thumb which have responded to cortisone injections in the past. She is requesting an injection today.  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     Review of Systems   Constitutional: Negative.    HENT: Negative.    Eyes: Negative.    Respiratory: Negative.    Cardiovascular: Negative.    Gastrointestinal: Negative.    Genitourinary: Negative.    Musculoskeletal: Positive for myalgias (R hand).   Skin: Negative.    Neurological: Negative.    Endo/Heme/Allergies: Negative.    Psychiatric/Behavioral: Negative.            History     Social History   ??? Marital Status: MARRIED     Spouse Name: N/A   ??? Number of Children: N/A   ??? Years of Education: N/A     Occupational History   ??? Not on file.     Social History Main Topics   ??? Smoking status: Never Smoker    ??? Smokeless tobacco: Never Used   ??? Alcohol Use: No   ??? Drug Use: Yes     Special: Prescription, OTC   ??? Sexual Activity: Not on file     Other Topics Concern   ??? Not on file     Social History Narrative        Past Medical History   Diagnosis Date   ??? Arthritis    ??? Fibrocystic breast    ??? Mitral (valve) prolapse    ??? Chronic fatigue    ??? Vitamin B 12 deficiency    ??? HTN    ??? Meningioma nos    ??? Broken nose 1999     surgery   ??? Essential hypertension    ??? MVP (mitral valve prolapse)    ??? Pancreatitis 2014   ??? Unspecified sleep apnea      uses cpap machine    ??? Unspecified adverse effect of anesthesia      difficulty waking up    ??? Paroxysmal atrial tachycardia ([place])    ??? Atrial fibrillation ([place])    ??? SVT (supraventricular tachycardia)    ??? PVC (premature ventricular contraction)    ??? Bilateral knee pain    ??? Hypothyroidism    ??? Atherosclerotic cardiovascular disease    ??? Carpal tunnel syndrome    ??? Left hip pain    ??? Right shoulder pain    ??? Right hip pain    ??? Left groin pain    ??? Echocardiogram 05/24/2010     EF 60-65%.  Increased wall thickness.  Normal diastolic function.  RVSP  25-30 mmHg.     ??? Holter monitor study 03/24/2012     Sinus rhythm, avg HR 95 bpm.  Rare PVCs.  No sustained arrhythmias, heart block, or pauses.   ??? Normal nuclear cardiac imaging test 07/07/2012     Low risk.  No ischemia or prior infarction.  EF >80%.  No WMA.  Neg EKG on pharm stress test.        Allergies   Allergen Reactions   ??? Beta Blocker [Beta-Blockers (Beta-Adrenergic Blocking Agts)] Other (comments)     Fatigue   ??? Tetracycline Hives         Current Outpatient Prescriptions   Medication Sig   ??? traMADol (ULTRAM) 50 mg tablet Take 1-2 Tabs by mouth three (3) times daily as needed for Pain. Max Daily Amount: 300 mg.   ??? venlafaxine-SR (EFFEXOR XR) 150 mg capsule Take  by mouth daily.   ??? traMADol (ULTRAM) 50 mg tablet    ??? HYDROcodone-acetaminophen (NORCO) 7.5-325 mg per tablet    ??? ascorbic acid (VITAMIN C) 250 mg tablet Take 250 mg by mouth daily.   ??? ferrous sulfate (IRON) 325 mg (65 mg iron) tablet Take 325 mg by mouth Daily (before breakfast).   ??? BENAZEPRIL HCL (LOTENSIN PO) Take 5 mg by mouth daily.   ??? primidone (MYSOLINE) 50 mg tablet TAKE 1 TABLET BY MOUTH EVERY MORNING AND 2 TABLETS BY MOUTH EVERY NIGHT AT BEDTIME   ??? clonazePAM (KLONOPIN) 0.5 mg tablet Take 1 Tab by mouth nightly as needed.   ??? hyoscyamine SL (LEVSIN/SL) 0.125 mg SL tablet Take 1 Tab by mouth every six (6) hours as needed for Cramping.    ??? Cholecalciferol, Vitamin D3, (VITAMIN D) 1,000 unit Cap Take 1,000 Int'l Units by mouth two (2) times a day.   ??? CYANOCOBALAMIN, VITAMIN B-12, (VITAMIN B-12 PO) Take 1 Tab by mouth daily.   ??? buPROPion SR (WELLBUTRIN SR) 150 mg SR tablet Take 150 mg by mouth two (2) times a day.   ??? estrogens, conjugated,-methylTESTOSTERone (ESTRATEST HS) 0.625-1.25 mg per tablet Take 1 Tab by mouth daily.     No current facility-administered medications for this visit.        Physical Exam  Constitutional: she is oriented to person, place, and time and well-developed, well-nourished, and in no distress. No distress.   HENT:   Head: Normocephalic and atraumatic.   Right Ear: Hearing normal.   Left Ear: Hearing normal.   Nose: Nose normal.   Eyes: Conjunctivae, EOM and lids are normal. Pupils are equal, round, and reactive to light.   Neck: Trachea normal.   Pulmonary/Chest: Effort normal and breath sounds normal. No respiratory distress.   Abdominal: Soft.   Neurological: she is alert and oriented to person, place, and time.   Skin: Skin is warm, dry and intact. she is not diaphoretic.   Psychiatric: Affect normal.   Nursing note and vitals reviewed.    Ortho Exam  R hand- ttp at the [facility] joint of the R thumb as well as a mildly positive Finkelstein's test. Otherwise sensation and circulation to the hand was normal and there was no increased warmth or skin discoloration.    Procedure: After timeout and under sterile conditions, patient's R thumb CMC joint was injected with 0.25 cc of Xylocaine and 1 cc of [place].    [facility]  OFFICE PROCEDURE PROGRESS NOTE        Chart reviewed for the following:  I, Darrin Nipper, MD, have reviewed the History, Physical and updated the Allergic reactions for [place] performed immediately prior to start of procedure:   I, Darrin Nipper, MD, have performed the following reviews on Cassandra Daniels prior to the start of the procedure:            * Patient was identified by name and date of birth   * Agreement on procedure being performed was verified  * Risks and Benefits explained to the patient  * Procedure site verified and marked as necessary  * Patient was positioned for comfort  * Consent was signed and verified     Time: 3:15 PM       Date of procedure: 12/12/2014    Procedure performed by:  Darrin Nipper, MD    Provider assisted by: None     Patient assisted by: self    How tolerated by patient: tolerated the procedure well with no complications    Comments: none   RADIOGRAPHS: no XR taken today.    IMPRESSION & PLAN: I feel her R hand pain is most likely due to recurrent pain from [facility] arthritis of the R thumb. With Pt permission I will give cortisone injection in the R thumb CMC joint. If this is not effective we will consider injecting her first dorsal radial compartment. I will also refer Pt to pain management to better control her chronic pain given the new guidelines for narcotic prescriptions. I will see her back PRN.      Scribed by Marikay Alar ([place]) as dictated by Cipriano Bunker, MD.

## 2014-12-12 NOTE — Patient Instructions (Signed)
Hand Pain: Care Instructions  Your Care Instructions  Common causes of hand pain are overuse and injuries, such as might happen during sports or home repair projects. Everyday wear and tear, especially as you get older, also can cause hand pain.  Most minor hand injuries will heal on their own, and home treatment is usually all you need to do. If you have sudden and severe pain, you may need tests and treatment.  Follow-up care is a key part of your treatment and safety. Be sure to make and go to all appointments, and call your doctor if you are having problems. It???s also a good idea to know your test results and keep a list of the medicines you take.  How can you care for yourself at home?  ?? Take pain medicines exactly as directed.  ?? If the doctor gave you a prescription medicine for pain, take it as prescribed.  ?? If you are not taking a prescription pain medicine, ask your doctor if you can take an over-the-counter medicine.  ?? Rest and protect your hand. Take a break from any activity that may cause pain.  ?? Put ice or a cold pack on your hand for 10 to 20 minutes at a time. Put a thin cloth between the ice and your skin.  ?? Prop up the sore hand on a pillow when you ice it or anytime you sit or lie down during the next 3 days. Try to keep it above the level of your heart. This will help reduce swelling.  ?? If your doctor recommends a sling, splint, or elastic bandage to support your hand, wear it as directed.  When should you call for help?  Call 911 anytime you think you may need emergency care. For example, call if:  ?? Your hand turns cool or pale or changes color.  Call your doctor now or seek immediate medical care if:  ?? You cannot move your hand.  ?? Your hand pops, moves out of its normal position, and then returns to its normal position.  ?? You have signs of infection, such as:  ?? Increased pain, swelling, warmth, or redness.  ?? Red streaks leading from the sore area.   ?? Pus draining from a place on your hand.  ?? A fever.  ?? Your hand feels numb or tingly.  Watch closely for changes in your health, and be sure to contact your doctor if:  ?? Your hand feels unstable when you try to use it.  ?? You do not get better as expected.  ?? You have any new symptoms, such as swelling.  ?? Bruises from an injury to your hand last longer than 2 weeks.   Where can you learn more?   Go to [employer]  Enter R273 in the search box to learn more about "Hand Pain: Care Instructions."   ?? 2006-2016 Healthwise, Incorporated. Care instructions adapted under license by [employer] (which disclaims liability or warranty for this information). This care instruction is for use with your licensed healthcare professional. If you have questions about a medical condition or this instruction, always ask your healthcare professional. [place] any warranty or liability for your use of this information.  Content Version: 10.8.513193; Current as of: Dec 30, 2013

## 2014-12-26 NOTE — Telephone Encounter (Signed)
PT CALLING IN TO REQUEST TO A NEW MEDICATION AS THE TRAMADOL DOESN'T SEEM TO BE WORKING ANY LONGER UNLESS SHE TAKES EXTRA. PT WOULD ALSO LIKE TO BE SET UP WITH PAIN MANAGEMENT TOO AND HASN'T BEEN. PLEASE CALL PT. @ [phone]

## 2014-12-26 NOTE — Telephone Encounter (Signed)
Left VM with the responses below.

## 2014-12-26 NOTE — Telephone Encounter (Signed)
Can`t give any narcotics

## 2014-12-26 NOTE — Telephone Encounter (Signed)
The request was sent to [facility] for review. They will either contact her directly to schedule or I will contact her if I receive a denial.

## 2014-12-26 NOTE — Telephone Encounter (Signed)
Janett Billow, will you check on the status of her ref.? Thanks.

## 2014-12-28 NOTE — Telephone Encounter (Signed)
PATIENT CALLED AND SAID SHE WOULD LIKE TO SPEAK WITH DR.JOHNSON DIRECTLY.     IT IS IN REGARDS TO HER BEING DENIED FOR MEDICATION AND ABOUT PAIN MANAGEMENT.    PATIENT IS REQUESTING A CALL BACK AT TEL. [phone] . SHE SAID SHE WILL BE AT THAT NUMBER UNTIL 3:00P.M. TODAY .

## 2014-12-28 NOTE — Telephone Encounter (Signed)
Left VM with the same instructions per WTJ as the last time.

## 2015-01-09 ENCOUNTER — Ambulatory Visit: Admit: 2015-01-09 | Payer: PRIVATE HEALTH INSURANCE | Attending: Orthopaedic Surgery | Primary: Family Medicine

## 2015-01-09 DIAGNOSIS — S86819A Strain of other muscle(s) and tendon(s) at lower leg level, unspecified leg, initial encounter: Secondary | ICD-10-CM

## 2015-01-09 MED ORDER — PREDNISONE 10 MG TAB
10 mg | ORAL_TABLET | ORAL | Status: DC
Start: 2015-01-09 — End: 2015-02-23

## 2015-01-09 NOTE — Patient Instructions (Signed)
Knee Pain or Injury: Care Instructions  Your Care Instructions     Injuries are a common cause of knee problems. Sudden (acute) injuries may be caused by a direct blow to the knee. They can also be caused by abnormal twisting, bending, or falling on the knee. Pain, bruising, or swelling may be severe, and may start within minutes of the injury.  Overuse is another cause of knee pain. Other causes are climbing stairs, kneeling, and other activities that use the knee. Everyday wear and tear, especially as you get older, also can cause knee pain.  Rest, along with home treatment, often relieves pain and allows your knee to heal. If you have a serious knee injury, you may need tests and treatment.  Follow-up care is a key part of your treatment and safety. Be sure to make and go to all appointments, and call your doctor if you are having problems. It's also a good idea to know your test results and keep a list of the medicines you take.  How can you care for yourself at home?  ?? Be safe with medicines. Read and follow all instructions on the label.  ?? If the doctor gave you a prescription medicine for pain, take it as prescribed.  ?? If you are not taking a prescription pain medicine, ask your doctor if you can take an over-the-counter medicine.  ?? Rest and protect your knee. Take a break from any activity that may cause pain.  ?? Put ice or a cold pack on your knee for 10 to 20 minutes at a time. Put a thin cloth between the ice and your skin.  ?? Prop up a sore knee on a pillow when you ice it or anytime you sit or lie down for the next 3 days. Try to keep it above the level of your heart. This will help reduce swelling.  ?? If your knee is not swollen, you can put moist heat, a heating pad, or a warm cloth on your knee.  ?? If your doctor recommends an elastic bandage, sleeve, or other type of support for your knee, wear it as directed.  ?? Follow your doctor's instructions about how much weight you can put on  your leg. Use a cane, crutches, or a walker as instructed.  ?? Follow your doctor's instructions about activity during your healing process. If you can do mild exercise, slowly increase your activity.  ?? Reach and stay at a healthy weight. Extra weight can strain the joints, especially the knees and hips, and make the pain worse. Losing even a few pounds may help.  When should you call for help?  Call 911 anytime you think you may need emergency care. For example, call if:  ?? You have symptoms of a blood clot in your lung (called a pulmonary embolism). These may include:  ?? Sudden chest pain.  ?? Trouble breathing.  ?? Coughing up blood.  Call your doctor now or seek immediate medical care if:  ?? You have severe or increasing pain.  ?? Your leg or foot turns cold or changes color.  ?? You cannot stand or put weight on your knee.  ?? Your knee looks twisted or bent out of shape.  ?? You cannot move your knee.  ?? You have signs of infection, such as:  ?? Increased pain, swelling, warmth, or redness.  ?? Red streaks leading from the knee.  ?? Pus draining from a place on your knee.  ?? A   fever.  ?? You have signs of a blood clot in your leg (called a deep vein thrombosis), such as:  ?? Pain in your calf, back of the knee, thigh, or groin.  ?? Redness and swelling in your leg or groin.  Watch closely for changes in your health, and be sure to contact your doctor if:  ?? You have tingling, weakness, or numbness in your knee.  ?? You have any new symptoms, such as swelling.  ?? You have bruises from a knee injury that last longer than 2 weeks.  ?? You do not get better as expected.  Where can you learn more?  Go to http://www.healthwise.net/GoodHelpConnections  Enter K195 in the search box to learn more about "Knee Pain or Injury: Care Instructions."  ?? 2006-2016 Healthwise, Incorporated. Care instructions adapted under license by [employer] (which disclaims liability or warranty  for this information). This care instruction is for use with your licensed healthcare professional. If you have questions about a medical condition or this instruction, always ask your healthcare professional. Healthwise, Incorporated disclaims any warranty or liability for your use of this information.  Content Version: 10.9.538570; Current as of: September 22, 2014

## 2015-01-09 NOTE — Progress Notes (Signed)
Chief Complaint   Patient presents with   ??? Leg Pain     right

## 2015-01-09 NOTE — Progress Notes (Signed)
Patient: Cassandra Daniels                MRN: [id number]       SSN: [id number]  Date of Birth: May 07, 1961        AGE: 54 y.o.        SEX: female  Body mass index is 31.09 kg/(m^2).    PCP: Georgianne Fick, MD  01/09/2015      Chief Complaint   Patient presents with   ??? Leg Pain     right       HISTORY OF PRESENT ILLNESS: Cassandra Daniels is a 54 y.o. female who presents to the office for f/u of RLE pain. Pt notes 5 days ago she noted cramping sensation in her L leg and then several days later noticed the same discomfort in her R posterior lateral calf region.  She denies any numbness or paresthesias in either leg. She denies any SOB or hx of this type of pain.    Review of Systems   Constitutional: Negative.    HENT: Negative.    Eyes: Negative.    Respiratory: Negative.    Cardiovascular: Negative.    Gastrointestinal: Negative.    Genitourinary: Negative.    Musculoskeletal: Positive for myalgias (RLE ).   Skin: Negative.    Neurological: Negative.    Endo/Heme/Allergies: Negative.    Psychiatric/Behavioral: Negative.            History     Social History   ??? Marital Status: MARRIED     Spouse Name: N/A   ??? Number of Children: N/A   ??? Years of Education: N/A     Occupational History   ??? Not on file.     Social History Main Topics   ??? Smoking status: Never Smoker    ??? Smokeless tobacco: Never Used   ??? Alcohol Use: No   ??? Drug Use: Yes     Special: Prescription, OTC   ??? Sexual Activity: Not on file     Other Topics Concern   ??? Not on file     Social History Narrative        Past Medical History   Diagnosis Date   ??? Arthritis    ??? Fibrocystic breast    ??? Mitral (valve) prolapse    ??? Chronic fatigue    ??? Vitamin B 12 deficiency    ??? HTN    ??? Meningioma nos    ??? Broken nose 1999     surgery   ??? Essential hypertension    ??? MVP (mitral valve prolapse)    ??? Pancreatitis 2014   ??? Unspecified sleep apnea      uses cpap machine   ??? Unspecified adverse effect of anesthesia      difficulty waking up     ??? Paroxysmal atrial tachycardia ([place])    ??? Atrial fibrillation ([place])    ??? SVT (supraventricular tachycardia)    ??? PVC (premature ventricular contraction)    ??? Bilateral knee pain    ??? Hypothyroidism    ??? Atherosclerotic cardiovascular disease    ??? Carpal tunnel syndrome    ??? Left hip pain    ??? Right shoulder pain    ??? Right hip pain    ??? Left groin pain    ??? Echocardiogram 05/24/2010     EF 60-65%.  Increased wall thickness.  Normal diastolic function.  RVSP 25-30 mmHg.     ??? Holter monitor study 03/24/2012  Sinus rhythm, avg HR 95 bpm.  Rare PVCs.  No sustained arrhythmias, heart block, or pauses.   ??? Normal nuclear cardiac imaging test 07/07/2012     Low risk.  No ischemia or prior infarction.  EF >80%.  No WMA.  Neg EKG on pharm stress test.        Allergies   Allergen Reactions   ??? Beta Blocker [Beta-Blockers (Beta-Adrenergic Blocking Agts)] Other (comments)     Fatigue   ??? Tetracycline Hives         Current Outpatient Prescriptions   Medication Sig   ??? metFORMIN ER (GLUCOPHAGE XR) 500 mg tablet    ??? HYDROcodone-acetaminophen (NORCO) 5-325 mg per tablet Take 1 Tab by mouth every eight (8) hours as needed for Pain. Max Daily Amount: 3 Tabs.   ??? traMADol (ULTRAM) 50 mg tablet Take 1 Tab by mouth every six (6) hours as needed for Pain. Max Daily Amount: 200 mg.   ??? traMADol (ULTRAM) 50 mg tablet Take 1-2 Tabs by mouth three (3) times daily as needed for Pain. Max Daily Amount: 300 mg.   ??? venlafaxine-SR (EFFEXOR XR) 150 mg capsule Take  by mouth daily.   ??? traMADol (ULTRAM) 50 mg tablet    ??? HYDROcodone-acetaminophen (NORCO) 7.5-325 mg per tablet    ??? buPROPion SR (WELLBUTRIN SR) 150 mg SR tablet Take 150 mg by mouth two (2) times a day.   ??? ascorbic acid (VITAMIN C) 250 mg tablet Take 250 mg by mouth daily.   ??? ferrous sulfate (IRON) 325 mg (65 mg iron) tablet Take 325 mg by mouth Daily (before breakfast).   ??? BENAZEPRIL HCL (LOTENSIN PO) Take 5 mg by mouth daily.    ??? estrogens, conjugated,-methylTESTOSTERone (ESTRATEST HS) 0.625-1.25 mg per tablet Take 1 Tab by mouth daily.   ??? primidone (MYSOLINE) 50 mg tablet TAKE 1 TABLET BY MOUTH EVERY MORNING AND 2 TABLETS BY MOUTH EVERY NIGHT AT BEDTIME   ??? clonazePAM (KLONOPIN) 0.5 mg tablet Take 1 Tab by mouth nightly as needed.   ??? hyoscyamine SL (LEVSIN/SL) 0.125 mg SL tablet Take 1 Tab by mouth every six (6) hours as needed for Cramping.   ??? Cholecalciferol, Vitamin D3, (VITAMIN D) 1,000 unit Cap Take 1,000 Int'l Units by mouth two (2) times a day.   ??? CYANOCOBALAMIN, VITAMIN B-12, (VITAMIN B-12 PO) Take 1 Tab by mouth daily.     No current facility-administered medications for this visit.        Physical Exam  Constitutional: she is oriented to person, place, and time and well-developed, well-nourished, and in no distress. No distress.   HENT:   Head: Normocephalic and atraumatic.   Right Ear: Hearing normal.   Left Ear: Hearing normal.   Nose: Nose normal.   Eyes: Conjunctivae, EOM and lids are normal. Pupils are equal, round, and reactive to light.   Neck: Trachea normal.   Pulmonary/Chest: Effort normal and breath sounds normal. No respiratory distress.   Abdominal: Soft.   Neurological: she is alert and oriented to person, place, and time.   Skin: Skin is warm, dry and intact. she is not diaphoretic.   Psychiatric: Affect normal.   Nursing note and vitals reviewed.    Ortho Exam  BLE- no redness, swelling, or ttp of either calf. Knee extension full and dorsiflexion of each ankle is full for BLE without pain. I was able to recreate her pain by having her perform a deep knee bend and having her do calf  raises standing by the exam table.  RADIOGRAPHS: no XR taken today.    IMPRESSION & PLAN: I feel her BLE pain is due to B/L calf strains with no evidence of blood clot or PAD. I will prescribe Pt a modified MDP. I also instructed she treat her pain with heat PRN. I will see her back PRN.       Scribed by Marikay Alar ([place]) as dictated by Cipriano Bunker, MD.

## 2015-01-17 ENCOUNTER — Encounter

## 2015-01-23 NOTE — Telephone Encounter (Signed)
No refills of Norco per WT[facility], see 12/26/14 message

## 2015-01-23 NOTE — Telephone Encounter (Signed)
Last Visit: 01/09/2015 with MD Wynetta Emery    Next Appointment: noted to f/u prn   Previous Refill Encounters: 12/12/2014 per MD Wynetta Emery #[id number]     Requested Prescriptions     Pending Prescriptions Disp Refills   ??? HYDROcodone-acetaminophen (NORCO) 5-325 mg per tablet 15 Tab 0     Sig: Take 1 Tab by mouth every eight (8) hours as needed for Pain. Max Daily Amount: 3 Tabs.

## 2015-01-29 ENCOUNTER — Encounter

## 2015-01-29 NOTE — Telephone Encounter (Signed)
Pt requesting refill on Tramadol since she can't have any narcotics.  Pt uses [facility] ([address]

## 2015-01-30 MED ORDER — TRAMADOL 50 MG TAB
50 mg | ORAL_TABLET | Freq: Four times a day (QID) | ORAL | Status: DC | PRN
Start: 2015-01-30 — End: 2015-02-23

## 2015-01-30 NOTE — Telephone Encounter (Signed)
Rx. called in and patient notified

## 2015-01-30 NOTE — Telephone Encounter (Signed)
PATIENT CALLED FOR DR.JOHNSON.    PATIENT SAID DR.JOHNSON WAS GOING TO REFER HER TO PAIN MANAGEMENT. THAT THIS WAS OVER A MONTH AGO AND SHE STILL HAS NOT HEARD BACK FROM ANYONE.   PATIENT IS REQUESTING A CALL BACK AS SOON AS POSSIBLE IN REGARDS TO THIS.    PATIENT CAN BE REACHED AT WORK # [phone] UNTIL 3:00 PM DURING THE WEEK.     AFTER 3:00PM CALL CELL # [phone].

## 2015-01-30 NOTE — Telephone Encounter (Signed)
PHONE IN RX     Last Visit: 01/09/2015 with MD Wynetta Emery    Next Appointment: noted to f/u prn   Previous Refill Encounters: 12/12/2014 per MD Wynetta Emery #[id number] with 2 refills     Requested Prescriptions     Pending Prescriptions Disp Refills   ??? traMADol (ULTRAM) 50 mg tablet 60 Tab 2     Sig: Take 1 Tab by mouth every six (6) hours as needed for Pain. Max Daily Amount: 200 mg.

## 2015-01-30 NOTE — Telephone Encounter (Signed)
Please phone in the prescription(s), then notify the patient. Medication(s) has/have been approved per Dr. Johnson. Thanks.

## 2015-01-30 NOTE — Telephone Encounter (Signed)
Per documenation on order patient has an apt 7-8 with Dr. Justice Rocher @ 1:40, called patient & gave her the info, she states she did not remember making that appt

## 2015-01-31 ENCOUNTER — Encounter

## 2015-02-07 ENCOUNTER — Inpatient Hospital Stay: Payer: PRIVATE HEALTH INSURANCE | Attending: Orthopaedic Surgery | Primary: Family Medicine

## 2015-02-15 NOTE — Telephone Encounter (Signed)
Patient called to request a refill for Ultram to be sure she has enough medication for her upcoming vacation. After researching the patient's chart, I noticed a prescription was approved on 01/30/2015 for this medication and I see documentation where this prescription was phoned in. However, the pharmacy does not have this prescription on file. Per Lawerance Bach at [facility], the last prescription they have on file dates 12/12/2014 for a 15 day supply. Could you please phone in this prescription again then notify the patient once you have done so? Thanks in advance.

## 2015-02-15 NOTE — Telephone Encounter (Signed)
Rx. was called in again today and left on pharmacy VM as was done on 01/30/15.

## 2015-02-16 ENCOUNTER — Encounter: Attending: Pain Medicine | Primary: Family Medicine

## 2015-02-23 ENCOUNTER — Ambulatory Visit: Attending: Pain Medicine | Primary: Family Medicine

## 2015-02-23 ENCOUNTER — Ambulatory Visit
Admit: 2015-02-23 | Discharge: 2015-02-23 | Payer: PRIVATE HEALTH INSURANCE | Attending: Pain Medicine | Primary: Family Medicine

## 2015-02-23 DIAGNOSIS — M1811 Unilateral primary osteoarthritis of first carpometacarpal joint, right hand: Secondary | ICD-10-CM

## 2015-02-23 LAB — AMB POC DRUG SCREEN (G0477)
AMPHETAMINES UR POC: NEGATIVE
BARBITURATES UR POC: NEGATIVE
BENZODIAZEPINES UR POC: NEGATIVE
CANNABINOIDS UR POC: NEGATIVE
COCAINE UR POC: NEGATIVE
MDMA/ECSTASY UR POC: NEGATIVE
METHADONE UR POC: NEGATIVE
METHAMPHETAMINE UR POC: NEGATIVE
METHYLPHENIDATE UR POC: NEGATIVE
OPIATES UR POC: NEGATIVE
OXYCODONE UR POC: NEGATIVE
PHENCYCLIDINE UR POC: NEGATIVE
TRICYCLICS UR POC: NEGATIVE

## 2015-02-23 MED ORDER — HYDROCODONE-ACETAMINOPHEN 7.5 MG-325 MG TAB
ORAL_TABLET | Freq: Two times a day (BID) | ORAL | Status: DC | PRN
Start: 2015-02-23 — End: 2015-04-18

## 2015-02-23 NOTE — Progress Notes (Signed)
Order for compounded cream faxed to [facility]. They will schedule directly with pt.

## 2015-02-23 NOTE — Progress Notes (Signed)
Pt in office for New Pt Appt. Contract reviewed and signed copy given to pt. UDS completed. Opioid Risk Tool Completed.   Pain Level 2/10  Hector Shade LPN

## 2015-02-23 NOTE — Progress Notes (Signed)
Pt here for NP appt. UDS with genetics done. Contract reviewed, signed and copy given to pt.

## 2015-02-26 NOTE — Progress Notes (Signed)
HISTORY OF PRESENT ILLNESS  Cassandra Daniels is a 54 y.o. female.  HPI She is [facility]. She is referred for evaluation and treatment of chronic, severe pain involving the right CMC joint, right knee, and both hips, and, to a lesser extent, polyarticular pain related to generalized osteoarthritis.  Extensive external medical records pertaining to her prior treatment were reviewed.    She has long-standing related to the joints in right above and has been followed regularly by her orthopedist who has been performing periodic injections prescribing tramadol and occasional hydrocodone.  She is quite satisfied with her current treatment regimen which is providing excellent pain relief.    Pain has been averaging 6/10 and she is reporting up to 70% overall relief.    Associated symptoms: Weight gain, insomnia, excessive daytime sleepiness, snoring, blurry vision, frequent sore throats, cough, palpitations, GERD, headache, difficulty walking, muscle pain and cramping, joint pain.    Past history is remarkable for fibrocystic breast disease, mitral valve prolapse, chronic fatigue, hypertension, vitamin B12 deficiency, hypertension, pancreatitis, obstructive sleep apnea, PhD, atrial fibrillation, SVT, PVCs, hypothyroidism, ASCVD,.  Past surgery: Gastric bypass in 1996.  Family history: Tension, coronary disease, myocardial infarction, headache.  Social history: She is employed full time as an Scientist, physiological. She is a nonsmoker and a rare drinker. She denies any current or recent recreational substance usage.    Low risk as identified on the opioid screening tool.    A score of 1 on the PHQ-9 is consistent with no affective distress.    PCS Score: 5. No significant catastrophising.    A review of the [place] prescription monitoring program does not identify any inconsistency.   UDS obtained and reviewed; formal confirmation from laboratory is pending.     A salivary fluid specimen was obtained and forwarded to the laboratory for cytogenetic analysis        Review of Systems   Constitutional: Positive for malaise/fatigue.   Gastrointestinal: Positive for heartburn, nausea and constipation.   Musculoskeletal: Positive for myalgias, back pain, joint pain (polyarticular) and neck pain.   Neurological: Positive for weakness (generalized).   Psychiatric/Behavioral: The patient has insomnia.    All other systems reviewed and are negative.      Physical Exam   Constitutional: She is oriented to person, place, and time. She appears cachectic. She has a sickly appearance. She appears distressed.   HENT:   Head: Normocephalic and atraumatic.   Right Ear: External ear normal.   Left Ear: External ear normal.   Nose: Nose normal.   Mouth/Throat: Oropharynx is clear and moist. No oropharyngeal exudate.   Eyes: Conjunctivae and EOM are normal. Pupils are equal, round, and reactive to light. Right eye exhibits no discharge. Left eye exhibits no discharge. No scleral icterus.   Neck: Muscular tenderness: spasm. Decreased range of motion present. No Brudzinski's sign and no Kernig's sign noted.   Cardiovascular: Normal rate, regular rhythm and normal heart sounds.    Pulmonary/Chest: Effort normal and breath sounds normal. No respiratory distress. She has no wheezes. She has no rales.   Abdominal: Soft. She exhibits no distension. There is no tenderness. There is no rebound and no guarding.   Musculoskeletal: She exhibits tenderness.        Right shoulder: She exhibits decreased range of motion and tenderness.        Left shoulder: She exhibits decreased range of motion and tenderness.  Right elbow: She exhibits decreased range of motion. Tenderness found.        Left elbow: She exhibits decreased range of motion. Tenderness found.        Right wrist: She exhibits decreased range of motion and tenderness.         Left wrist: She exhibits decreased range of motion and tenderness.        Right hip: She exhibits decreased range of motion and tenderness.        Left hip: She exhibits decreased range of motion and tenderness.        Right knee: She exhibits decreased range of motion. Tenderness found.        Left knee: She exhibits decreased range of motion. Tenderness found.        Right ankle: She exhibits decreased range of motion. Tenderness.        Left ankle: She exhibits decreased range of motion. Tenderness.        Lumbar back: She exhibits decreased range of motion, tenderness, pain and spasm.   Neurological: She is alert and oriented to person, place, and time. She has normal reflexes. No cranial nerve deficit or sensory deficit. She exhibits normal muscle tone. Gait abnormal. Coordination normal.   Reflex Scores:       Tricep reflexes are 2+ on the right side and 2+ on the left side.       Bicep reflexes are 2+ on the right side and 2+ on the left side.       Brachioradialis reflexes are 2+ on the right side and 2+ on the left side.       Patellar reflexes are 2+ on the right side and 2+ on the left side.       Achilles reflexes are 2+ on the right side and 2+ on the left side.  Skin: Skin is warm. No rash noted.   Psychiatric: Her speech is normal and behavior is normal. Judgment and thought content normal. She exhibits a depressed mood.       ASSESSMENT and PLAN  Encounter Diagnoses   Name Primary?   ??? Primary osteoarthritis of first carpometacarpal joint of right hand Yes   ??? Encounter for long-term (current) use of medications    ??? Chronic pain of right knee    ??? Bilateral hip pain    ??? Enthesopathy of hip region on both sides    ??? Enthesopathy, knee    ??? Chronic pain syndrome      As she has been doing quite well on her current regimen she will continue to receive tramadol and injection therapies from her orthopedist. A prescription for hydrocodone, 7.5/325, #40 will be given. As she has been  using this quite sparingly it is expected that this will last for 2 months.  She will be reassessed in 2 months time.    1. Pain medications are prescribed with the objective of pain relief and improved physical and psychosocial function in this patient.  2. Counseled patient on proper use of prescribed medications and reviewed opioid contract.  3. Counseled patient about chronic medical conditions and their relationship to anxiety and depression and recommended mental health support as needed.  4. Reviewed with patient self-help tools, home exercise, and lifestyle changes to assist the patient in self-management of symptoms.  5. Advised patient to have a primary care provider to continue care for health maintenance and general medical conditions and support for referral to specialty care as needed.  6. Reviewed with patient the treatment plan, goals of treatment plan, and limitations of treatment plan, to include the potential for side effects from medications and procedures. If side effects occur, it is the responsibility of the patient to inform the clinic so that a change in the treatment plan can be made in a safe manner. The patient is advised that stopping prescribed medication may cause an increase in symptoms and possible medication withdrawal symptoms. The patient is informed an emergency room evaluation may be necessary if this occurs.  DISPOSITION: The patient???s condition and plan were discussed at length and all questions were answered. The patient agrees with the plan.    Counseling occupied > 50% of visit:  Total time: 50 minutes

## 2015-02-26 NOTE — Patient Instructions (Signed)
Current health maintenance issues were reviewed and the patient was advised to followup with his/her PCP for completion of these items.

## 2015-03-06 NOTE — Telephone Encounter (Signed)
Left VM, per WTJ, no more refills on Tramadol, patient is getting pain meds from another provider.

## 2015-03-06 NOTE — Telephone Encounter (Signed)
Refill was given on 02/15/15, too soon for refill

## 2015-03-06 NOTE — Telephone Encounter (Signed)
NO   No                                    MORE TRAMADOL  No  NOOOOOOOOO

## 2015-03-06 NOTE — Telephone Encounter (Signed)
PHONE IN RX    Patient reports to being seen by [facility]. She states Dr. Justice Rocher advised her to continue Ultram and she should continue to receive refills from orthopedics (confirmed in office note).     Last Visit: 01/09/2015 with MD Wynetta Emery  Next Appointment: noted to f/u prn   Previous Refill Encounters: 11/14/2014 per MD Wynetta Emery #[id number]     Requested Prescriptions     Pending Prescriptions Disp Refills   ??? traMADol (ULTRAM) 50 mg tablet 60 Tab 0     Sig: Take 1-2 Tabs by mouth three (3) times daily as needed for Pain. Max Daily Amount: 300 mg.

## 2015-03-06 NOTE — Telephone Encounter (Signed)
PATIENT CALLED BACK IN REGARDS TO BEING DECLINED FOR A REFILL ON TRAMADOL MEDICATION FROM DR. Wynetta Emery DUE TO IT WAS TO EARLY FOR A REFILL; SINCE THE PATIENT HAD THE LAST REFILL ON 02/15/15.  NOTES IN [facility] FROM 03/06/15.      PATIENT SAID THE REASON SHE IS REQUESTING THE REFILL ON TRAMADOL IS BECAUSE SINCE SHE WAS AWAY ON VACATION SHE WAS TAKING THE TRAMADOL TWICE OR EVEN THREE TIMES A DAY . SO  NOW SHE IS RUNNING LOW ON MEDICATION.    PATIENT IS REQUESTING TO SPEAK TO ETHER DR.JOHNSON OR HIS NURSE IN REGARDS TO THIS . PATIENT SAID SHE WANTS TO EXPLAIN WHY SHE NEEDS A REFILL ON THE TRAMADOL MEDICATION.

## 2015-03-07 NOTE — Telephone Encounter (Signed)
Pt called and left a voice mail requesting [facility] prescribe her Tramadol 50 mg. Chart was reviewed and shows pt had a new pt appt with Dr. Justice Rocher and this medication was not prescribed, 02/23/15. Dr. Cipriano Bunker has previously prescribed this medication. This new medication request will have to be brought up at next OV. Return call was made and voice mail was left. PMP reviewed pt last filled 02/15/15 Tramadol 50 mg #60.

## 2015-03-08 ENCOUNTER — Telehealth

## 2015-03-08 NOTE — Telephone Encounter (Addendum)
PATIENT HAS ALSO CONTACTED DR Gypsy Balsam OFFICE REGARDING THIS SITUATION. PER DR HOLZERS 02-23-15 OFFICE NOTE, HE WAS OK WITH DR Wynetta Emery CONTINUING TO GIVE THIS PATIENT TRAMADOL. PER 03-08-15 PHONE MESSAGE WITH DR Gypsy Balsam OFFICE, IT STATES THIS AS WELL. PATIENT IS AWARE THAT DR Wynetta Emery IS OUT OF THE OFFICE TODAY AND TOMORROW AND SOMEONE WILL CALL HER Monday.  SHE ASKED WHAT OFFICE HE WAS IN ON Monday AND I TOLD HER HV IN THE MORNING AND MEADE IN THE AFTERNOON, SHE STATED THAT SHE WOULD BE SITTING IN THE LOBBY AT 8:00 WAITING FOR HIM. I TOLD HER THAT IF SHE WANTED TO SEE DR Wynetta Emery THAT SHE NEEDED AN APPOINTMENT AND I TRANSFERRED HER TO THE FRONT DESK AND SHE HUNG UP AFTER AGREEING TO MAKE THE APPOINTMENT.      DO NOT CLOSE MESSAGE UNTIL DR McIntire

## 2015-03-08 NOTE — Telephone Encounter (Signed)
The pt called the office upset. She states that she has been trying to contact someone in our office for 2 days and has not received a call back. A chart review was done and it was noted that a message was received and a return call was documented on 03/07/15. A message was left for the pt. She was advised of this and became argumentative and stated that she gave specific instructions on her voicemail on when and where to call her and what number. She said the message that was left for her was on the wrong number. An apology was given to the pt. She stated that she was getting tramadol from Dr. Wynetta Emery and he will not give it to her now because she comes to our office. She was told by Dr. Justice Rocher that she could still get this medication from him and that he was going to send them this notice. A review of the 02/23/15 shows that the provider did document this is in his treatment plan and instructions. While the pt was on hold, the call accidentally got disconnected. I called all 3 numbers to reach the pt and was not able to reach her. She did call back soon after that and an apology was issued to the pt for the disconnection. She was made aware that the information was documented by the provider and that Dr. Durenda Age office can access this information from [facility]. The pt was irate and wanted to talk to the manager of our practice. She was provided the number to the nursing supervisor and was told to ask for [place]. The pt then requested that we call Dr. Durenda Age office to make him aware of this. I informed the pt that we can do this for her. However, we cannot make them prescribe this medication if they do not feel comfortable doing so. Pt said she was also going to call their office. [facility] was called and I spoke to one of the office staff over there. There were made aware of the ability for them to continue prescribing the tramadol and I was told that  he will not be doing this for her anymore because she is getting meds from our office. They were told to reference the note from 02/23/15. She said she would let him know but she does not think he will.

## 2015-03-08 NOTE — Telephone Encounter (Signed)
PATIENT CALLED UPSET AND IS REQUESTING TO SPEAK TO SOMEONE ASAP DUE TO THE TRAMADOL MEDICATION THAT WAS DENIED ON 03/07/15.    PATIENT IS REQUESTING TO SPEAK TO EITHER A DOCTOR OR A NURSE.     SHE SAID SHE HAS CALLED SEVERAL TIMES WANTING TO SPEAK WITH DR.JOHNSON OR A NURSE. THAT SHE JUST WANTS TO SPEAK TO ANYONE TO EXPLAIN WHY SHE IS IN NEED OF THE TRAMADOL MEDICATION.    PATIENT WOULD LIKE A CALL BACK ON HER CELL # [phone].

## 2015-03-12 MED ORDER — TRAMADOL 50 MG TAB
50 mg | ORAL_TABLET | Freq: Three times a day (TID) | ORAL | Status: DC | PRN
Start: 2015-03-12 — End: 2015-07-25

## 2015-03-12 NOTE — Telephone Encounter (Signed)
Spoke with patient and advised WTJ's instructions below and Rx. called in to [facility] today and left on the pharmacy VM.

## 2015-03-12 NOTE — Telephone Encounter (Signed)
90 Tramadol; max 1 po TID; 3 refills; must last her 3 months

## 2015-03-16 ENCOUNTER — Ambulatory Visit: Admit: 2015-03-16 | Discharge: 2015-03-16 | Payer: PRIVATE HEALTH INSURANCE | Primary: Family Medicine

## 2015-03-16 DIAGNOSIS — Z7189 Other specified counseling: Secondary | ICD-10-CM

## 2015-03-16 NOTE — Telephone Encounter (Signed)
[facility] pharmacy called on behalf of pt.  Pt filled rx for Tramadol 8/3 and adv pharmacy there should have been 3 refills.  Pharmacy adv they show no refills.  Pt is not ready for refills yet, but wanted to clarify prior to actually needing a refill.  Please adv pt if any issues at [phone].

## 2015-03-16 NOTE — Telephone Encounter (Signed)
Pharmacy contacted and told that there are 3 refills on this RX.

## 2015-03-16 NOTE — Progress Notes (Signed)
[name] attended the Education Class facilitated by Hurshel Keys, LPN. Objectives of this class are to review practice policies, protocols and the Controlled Substances Consent and Treatment Agreement, discuss "what if" scenarios, introduce staff, and provide an opportunity for questions and answers. Ultimately, goals for this class are for Cassandra Daniels to:    ?? Be educated - Learn as much as possible about her pain and related treatment, our policies and the Controlled Substances Agreement.  ?? Be responsible - Follow the provider???s advice regarding treatment recommendations, medications, and prescription information.  ?? Be confident - Better manage her pain and return to a more functional lifestyle.    At least 45 minutes was spent with the patient in face-to-face contact today.

## 2015-03-28 ENCOUNTER — Ambulatory Visit: Admit: 2015-03-28 | Payer: PRIVATE HEALTH INSURANCE | Attending: Orthopaedic Surgery | Primary: Family Medicine

## 2015-03-28 DIAGNOSIS — M79641 Pain in right hand: Secondary | ICD-10-CM

## 2015-03-28 MED ORDER — LIDOCAINE HCL 1 % (10 MG/ML) IJ SOLN
10 mg/mL (1 %) | Freq: Once | INTRAMUSCULAR | 0 refills | Status: AC
Start: 2015-03-28 — End: 2015-03-28

## 2015-03-28 MED ORDER — TRIAMCINOLONE ACETONIDE 40 MG/ML SUSP FOR INJECTION
40 mg/mL | Freq: Once | INTRAMUSCULAR | 0 refills | Status: AC
Start: 2015-03-28 — End: 2015-03-28

## 2015-03-28 NOTE — Patient Instructions (Signed)
Joint Pain: Care Instructions  Your Care Instructions  Many people have small aches and pains from overuse or injury to muscles and joints. Joint injuries often happen during sports or recreation, work tasks, or projects around the home. An overuse injury can happen when you put too much stress on a joint or when you do an activity that stresses the joint over and over, such as using the computer or rowing a boat.  You can take action at home to help your muscles and joints get better. You should feel better in 1 to 2 weeks, but it can take 3 months or more to heal completely.  Follow-up care is a key part of your treatment and safety. Be sure to make and go to all appointments, and call your doctor if you are having problems. It's also a good idea to know your test results and keep a list of the medicines you take.  How can you care for yourself at home?  ?? Do not put weight on the injured joint for at least a day or two.  ?? For the first day or two after an injury, do not take hot showers or baths, and do not use hot packs. The heat could make swelling worse.  ?? Put ice or a cold pack on the sore joint for 10 to 20 minutes at a time. Try to do this every 1 to 2 hours for the next 3 days (when you are awake) or until the swelling goes down. Put a thin cloth between the ice and your skin.  ?? Wrap the injury in an elastic bandage. Do not wrap it too tightly because this can cause more swelling.  ?? Prop up the sore joint on a pillow when you ice it or anytime you sit or lie down during the next 3 days. Try to keep it above the level of your heart. This will help reduce swelling.  ?? Take an over-the-counter pain medicine, such as acetaminophen (Tylenol), ibuprofen (Advil, Motrin), or naproxen (Aleve). Read and follow all instructions on the label.  ?? After 1 or 2 days of rest, begin moving the joint gently. While the joint is still healing, you can begin to exercise using activities that do  not strain or hurt the painful joint.  When should you call for help?  Call your doctor now or seek immediate medical care if:  ?? You have signs of infection, such as:  ?? Increased pain, swelling, warmth, and redness.  ?? Red streaks leading from the joint.  ?? A fever.  Watch closely for changes in your health, and be sure to contact your doctor if:  ?? Your movement or symptoms are not getting better after 1 to 2 weeks of home treatment.  Where can you learn more?  Go to [employer]  Enter P205 in the search box to learn more about "Joint Pain: Care Instructions."  ?? 2006-2016 [employer]. Care instructions adapted under license by [employer] (which disclaims liability or warranty for this information). This care instruction is for use with your licensed healthcare professional. If you have questions about a medical condition or this instruction, always ask your healthcare professional. [place] any warranty or liability for your use of this information.  Content Version: 10.9.538570; Current as of: Dec 30, 2013

## 2015-03-28 NOTE — Progress Notes (Signed)
Patient: Cassandra Daniels                MRN: [id number]       SSN: [id number]  Date of Birth: [birth year]        AGE: 54 y.o.        SEX: female  Body mass index is 32.37 kg/(m^2).    PCP: Georgianne Fick, MD  03/28/15      Chief Complaint   Patient presents with   ??? Knee Pain     Right   ??? Wrist Pain     Right       HISTORY OF PRESENT ILLNESS: Cassandra Daniels is a 54 y.o. female who presents to the office for f/u R hand pain, as well as R knee pain. She reports one episode where she was standing and it felt as though her R hip gave out. I suspect labral tear of hip, she will follow up with Dr. Domingo Pulse for this issue. She steas that her hand pain is located at carpal-metacarpal joint of thumb as well as palm of hand. She has had some occasional tingling in little finger of R hand. We also discussed the fact that for her hydrocodone she will need to obtain that from pain management since we are not set up to do that on a long term basis.    Review of Systems   Constitutional: Negative.    HENT: Negative.    Eyes: Negative.    Respiratory: Negative.    Cardiovascular: Negative.    Gastrointestinal: Negative.    Genitourinary: Negative.    Musculoskeletal: Joint pain: R hand; R knee.   Skin: Negative.    Neurological: Negative.    Endo/Heme/Allergies: Negative.    Psychiatric/Behavioral: Negative.          Social History     Social History   ??? Marital status: MARRIED     Spouse name: N/A   ??? Number of children: N/A   ??? Years of education: N/A     Occupational History   ??? Not on file.     Social History Main Topics   ??? Smoking status: Never Smoker   ??? Smokeless tobacco: Never Used   ??? Alcohol use No   ??? Drug use: No   ??? Sexual activity: Not on file     Other Topics Concern   ??? Not on file     Social History Narrative        Past Medical History   Diagnosis Date   ??? Arthritis    ??? Atherosclerotic cardiovascular disease    ??? Atrial fibrillation ([place])    ??? Bilateral knee pain    ??? Broken nose 1999      surgery   ??? Carpal tunnel syndrome    ??? Chronic fatigue    ??? Echocardiogram 05/24/2010     EF 60-65%.  Increased wall thickness.  Normal diastolic function.  RVSP 25-30 mmHg.     ??? Essential hypertension    ??? Fibrocystic breast    ??? Holter monitor study 03/24/2012     Sinus rhythm, avg HR 95 bpm.  Rare PVCs.  No sustained arrhythmias, heart block, or pauses.   ??? HTN    ??? Hypothyroidism    ??? Left groin pain    ??? Left hip pain    ??? Meningioma nos    ??? Mitral (valve) prolapse    ??? MVP (mitral valve prolapse)    ??? Normal nuclear  cardiac imaging test 07/07/2012     Low risk.  No ischemia or prior infarction.  EF >80%.  No WMA.  Neg EKG on pharm stress test.   ??? Pancreatitis 2014   ??? Paroxysmal atrial tachycardia ([place])    ??? PVC (premature ventricular contraction)    ??? Right hip pain    ??? Right shoulder pain    ??? SVT (supraventricular tachycardia)    ??? Unspecified adverse effect of anesthesia      difficulty waking up    ??? Unspecified sleep apnea      uses cpap machine   ??? Vitamin B 12 deficiency         Allergies   Allergen Reactions   ??? Beta Blocker [Beta-Blockers (Beta-Adrenergic Blocking Agts)] Other (comments)     Fatigue   ??? Tetracycline Hives         Current Outpatient Prescriptions   Medication Sig   ??? traMADol (ULTRAM) 50 mg tablet Take 1 Tab by mouth every eight (8) hours as needed for Pain. Max Daily Amount: 150 mg.   ??? CYCLOBENZAPRINE HCL (FLEXERIL PO) Take 10 mg by mouth.   ??? zolpidem CR (AMBIEN CR) 6.25 mg tablet Take 6.25 mg by mouth nightly as needed for Sleep.   ??? HYDROcodone-acetaminophen (NORCO) 5-325 mg per tablet Take 1 Tab by mouth every eight (8) hours as needed for Pain. Max Daily Amount: 3 Tabs.   ??? venlafaxine-SR (EFFEXOR XR) 150 mg capsule Take  by mouth daily.   ??? ascorbic acid (VITAMIN C) 250 mg tablet Take 250 mg by mouth daily.   ??? ferrous sulfate (IRON) 325 mg (65 mg iron) tablet Take 325 mg by mouth Daily (before breakfast).   ??? BENAZEPRIL HCL (LOTENSIN PO) Take 5 mg by mouth daily.    ??? primidone (MYSOLINE) 50 mg tablet TAKE 1 TABLET BY MOUTH EVERY MORNING AND 2 TABLETS BY MOUTH EVERY NIGHT AT BEDTIME   ??? clonazePAM (KLONOPIN) 0.5 mg tablet Take 1 Tab by mouth nightly as needed.   ??? hyoscyamine SL (LEVSIN/SL) 0.125 mg SL tablet Take 1 Tab by mouth every six (6) hours as needed for Cramping.   ??? Cholecalciferol, Vitamin D3, (VITAMIN D) 1,000 unit Cap Take 1,000 Int'l Units by mouth two (2) times a day.   ??? CYANOCOBALAMIN, VITAMIN B-12, (VITAMIN B-12 PO) Take 1 Tab by mouth daily.     No current facility-administered medications for this visit.         Physical Exam  Constitutional: she is oriented to person, place, and time and well-developed, well-nourished, and in no distress. No distress.   HENT:   Head: Normocephalic and atraumatic.   Right Ear: Hearing normal.   Left Ear: Hearing normal.   Nose: Nose normal.   Eyes: Conjunctivae, EOM and lids are normal. Pupils are equal, round, and reactive to light.   Neck: Trachea normal.   Pulmonary/Chest: Effort normal and breath sounds normal. No respiratory distress.   Abdominal: Soft.   Neurological: she is alert and oriented to person, place, and time.   Skin: Skin is warm, dry and intact. she is not diaphoretic.   Psychiatric: Affect normal.   Nursing note and vitals reviewed.    Ortho Exam  R wrist: No numbness of little finger. Interosseous function and grip strength was normal. Pain to palpation at thumb carpal-metacarpal joint as well as pain with flexion/extension at that joint when preformed against resistance. No tenderness to palpation or swelling of the palm. Negative Tinel's  sign when tapping over ulnar nerve.  R knee: No swelling, redness, or increased warmth of R knee.  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 No effusion or stasis changes.     Procedure: After timeout and under sterile conditions, patient's R knee  was injected with 6 cc of Xylocaine and 1.5 cc of Kenalog.    [place]  OFFICE PROCEDURE PROGRESS NOTE        Chart reviewed for the following:   I, Darrin Nipper, MD, have reviewed the History, Physical and updated the Allergic reactions for [place] performed immediately prior to start of procedure:   I, Darrin Nipper, MD, have performed the following reviews on Kila Godina prior to the start of the procedure:            * Patient was identified by name and date of birth   * Agreement on procedure being performed was verified  * Risks and Benefits explained to the patient  * Procedure site verified and marked as necessary  * Patient was positioned for comfort  * Consent was signed and verified     Time: 8:22 AM    Date of procedure: 03/28/2015    Procedure performed by:  Darrin Nipper, MD    Provider assisted by: None     Patient assisted by: self    How tolerated by patient: tolerated the procedure well with no complications    Comments: none    RADIOGRAPHS: No x-rays were taken today.    IMPRESSION & PLAN: I feel her R little finger may be due to ulnar nerve compression. Her thumb pain is due to OA at the carpal-metacarpal joint. I feel her knee pain is due to patellofemoral OA. With pt's permission, I gave her a cortisone injection today for her R knee. She will obtain an EMG and neural conduction velocity to look for ulnar nerve entrapment of the R elbow. I will see her back PRN.      Scribed by Jenna Luo ([place]) as dictated by Cipriano Bunker, MD.

## 2015-04-02 NOTE — Telephone Encounter (Signed)
[name] calling regarding her EMG RUE. Please enter the EMG as a procedure. This is required for a [facility] physician to complete the testing request. Please inform Beth once the procedure is in [place]. Thank you.

## 2015-04-02 NOTE — Addendum Note (Signed)
Addended by: Jenna Luo on: 04/02/2015 03:55 PM      Modules accepted: Orders

## 2015-04-02 NOTE — Addendum Note (Signed)
Addended by: Cipriano Bunker T on: 04/02/2015 04:37 PM      Modules accepted: Orders

## 2015-04-18 MED ORDER — HYDROCODONE-ACETAMINOPHEN 7.5 MG-325 MG TAB
ORAL_TABLET | Freq: Two times a day (BID) | ORAL | 0 refills | Status: DC | PRN
Start: 2015-04-18 — End: 2015-05-02

## 2015-04-18 NOTE — Telephone Encounter (Signed)
Pt called requesting refills on her norco. She was given #40 back in July and does not think this will last until her f/u later this month. DH approved and pt notified of approval.

## 2015-04-24 ENCOUNTER — Telehealth

## 2015-04-24 NOTE — Telephone Encounter (Signed)
Ok for mri °

## 2015-04-24 NOTE — Telephone Encounter (Signed)
Patient is requesting an MRI on right knee.  Please advise if this can be ordered without another office visit.  Last seen 8/17 for wrist and knee pain.  Patient can be reached at [phone].

## 2015-04-25 ENCOUNTER — Encounter: Attending: Physician Assistant | Primary: Family Medicine

## 2015-05-02 ENCOUNTER — Ambulatory Visit: Attending: Pain Medicine | Primary: Family Medicine

## 2015-05-02 ENCOUNTER — Inpatient Hospital Stay: Payer: PRIVATE HEALTH INSURANCE | Attending: Orthopaedic Surgery | Primary: Family Medicine

## 2015-05-02 ENCOUNTER — Ambulatory Visit
Admit: 2015-05-02 | Discharge: 2015-05-02 | Payer: PRIVATE HEALTH INSURANCE | Attending: Pain Medicine | Primary: Family Medicine

## 2015-05-02 DIAGNOSIS — M25551 Pain in right hip: Secondary | ICD-10-CM

## 2015-05-02 MED ORDER — DICLOFENAC 1 % TOPICAL GEL
1 % | Freq: Four times a day (QID) | CUTANEOUS | 5 refills | Status: AC
Start: 2015-05-02 — End: 2015-06-01

## 2015-05-02 MED ORDER — HYDROCODONE-ACETAMINOPHEN 7.5 MG-325 MG TAB
ORAL_TABLET | Freq: Two times a day (BID) | ORAL | 0 refills | Status: DC | PRN
Start: 2015-05-02 — End: 2015-07-25

## 2015-05-02 MED ORDER — TRAMADOL 50 MG TAB
50 mg | ORAL_TABLET | Freq: Four times a day (QID) | ORAL | 5 refills | Status: AC | PRN
Start: 2015-05-02 — End: 2015-06-01

## 2015-05-02 NOTE — Patient Instructions (Signed)
Current health maintenance issues were reviewed and the patient was advised to followup with his/her PCP for completion of these items.

## 2015-05-02 NOTE — Progress Notes (Signed)
HISTORY OF PRESENT ILLNESS  Cassandra Daniels is a 54 y.o. female.  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  In addition, she suffers from chronic right knee pain related to osteoarthritis.     she is scheduled to undergo MRI studies of her right knee and right hip per her orthopedist.    She was unable to obtain the compound ointment as this was too costly.  Voltaren gel will be prescribed as a possible alternative.    Pain has been under good control overall, averaging 5/10 with 70% overall relief.  Physical activity and mobility, mood, and sleep are all fair.  No reported side effects.  She is also scheduled to undergo electrodiagnostic testing of the right upper extremity to assess the severity of her clinically apparent ulnar entrapment neuropathy at cubital tunnel.    Review of Systems   Constitutional: Positive for malaise/fatigue.   Gastrointestinal: Positive for constipation, heartburn and nausea.   Musculoskeletal: Positive for back pain, joint pain (polyarticular), myalgias and neck pain.   Neurological: Positive for weakness (generalized).   Psychiatric/Behavioral: The patient has insomnia.    All other systems reviewed and are negative.      Physical Exam   Constitutional: She is oriented to person, place, and time. She appears cachectic. She has a sickly appearance. She appears distressed.   HENT:   Head: Normocephalic and atraumatic.   Right Ear: External ear normal.   Left Ear: External ear normal.   Nose: Nose normal.   Mouth/Throat: Oropharynx is clear and moist. No oropharyngeal exudate.   Eyes: Conjunctivae and EOM are normal. Pupils are equal, round, and reactive to light. Right eye exhibits no discharge. Left eye exhibits no discharge. No scleral icterus.    Neck: Muscular tenderness: spasm. Decreased range of motion present. No Brudzinski's sign and no Kernig's sign noted.   Cardiovascular: Normal rate, regular rhythm and normal heart sounds.    Pulmonary/Chest: Effort normal and breath sounds normal. No respiratory distress. She has no wheezes. She has no rales.   Abdominal: Soft. She exhibits no distension. There is no tenderness. There is no rebound and no guarding.   Musculoskeletal: She exhibits tenderness.        Right shoulder: She exhibits decreased range of motion and tenderness.        Left shoulder: She exhibits decreased range of motion and tenderness.        Right elbow: She exhibits decreased range of motion. Tenderness found.        Left elbow: She exhibits decreased range of motion. Tenderness found.        Right wrist: She exhibits decreased range of motion and tenderness.        Left wrist: She exhibits decreased range of motion and tenderness.        Right hip: She exhibits decreased range of motion and tenderness.        Left hip: She exhibits decreased range of motion and tenderness.        Right knee: She exhibits decreased range of motion. Tenderness found.        Left knee: She exhibits decreased range of motion. Tenderness found.        Right ankle: She exhibits decreased range of motion. Tenderness.        Left ankle: She exhibits decreased range of motion. Tenderness.  Lumbar back: She exhibits decreased range of motion, tenderness, pain and spasm.   Neurological: She is alert and oriented to person, place, and time. She has normal reflexes. No cranial nerve deficit or sensory deficit. She exhibits normal muscle tone. Gait abnormal. Coordination normal.   Reflex Scores:       Tricep reflexes are 2+ on the right side and 2+ on the left side.       Bicep reflexes are 2+ on the right side and 2+ on the left side.       Brachioradialis reflexes are 2+ on the right side and 2+ on the left side.        Patellar reflexes are 2+ on the right side and 2+ on the left side.       Achilles reflexes are 2+ on the right side and 2+ on the left side.  Skin: Skin is warm. No rash noted.   Psychiatric: Her speech is normal and behavior is normal. Judgment and thought content normal. She exhibits a depressed mood.       ASSESSMENT and PLAN  Encounter Diagnoses   Name Primary?   ??? Right hip pain Yes   ??? Primary osteoarthritis of first carpometacarpal joint of right hand    ??? Thumb tendonitis    ??? Ulnar nerve entrapment, right    ??? De Quervain's tenosynovitis, right    ??? Chronic pain syndrome    ??? Encounter for long-term (current) use of high-risk medication      Treatment plan as noted above.  She will continue on her current analgesic regimen as this is providing adequate pain control with improve functionality and minimal side effects.    No concerns are raised for misuse, abuse, or diversion.    1. Pain medications are prescribed with the objective of pain relief and improved physical and psychosocial function in this patient.  2. Counseled patient on proper use of prescribed medications and reviewed opioid contract.  3. Counseled patient about chronic medical conditions and their relationship to anxiety and depression and recommended mental health support as needed.  4. Reviewed with patient self-help tools, home exercise, and lifestyle changes to assist the patient in self-management of symptoms.  5. Advised patient to have a primary care provider to continue care for health maintenance and general medical conditions and support for referral to specialty care as needed.  6. Reviewed with patient the treatment plan, goals of treatment plan, and limitations of treatment plan, to include the potential for side effects from medications and procedures. If side effects occur, it is the responsibility of the patient to inform the clinic so that a change in the  treatment plan can be made in a safe manner. The patient is advised that stopping prescribed medication may cause an increase in symptoms and possible medication withdrawal symptoms. The patient is informed an emergency room evaluation may be necessary if this occurs.  DISPOSITION: The patient???s condition and plan were discussed at length and all questions were answered. The patient agrees with the plan.    Counseling occupied > 50% of visit:  Total time: 30 minutes

## 2015-05-02 NOTE — Progress Notes (Signed)
Nursing Notes    Patient presents to the office today in follow-up.   Patient rates her pain at 3/10 on the numerical pain scale.     Reviewed medications with counts as follows:    Rx Date filled Qty Dispensed Pill Count Last Dose Short   norco 7.5 04/20/15 40 24  no       Comments: pt would like [place] to take over her Tramadol.     POC UDS was not performed in office today    Any new labs or imaging since last appointment? NO    Have you been to an emergency room (ER) or urgent care clinic since your last visit?  NO            Have you been hospitalized since your last visit? NO     If yes, where, when, and reason for visit?     Have you seen or consulted any other health care providers outside of the [facility]  since your last visit?  NO     If yes, where, when, and reason for visit?

## 2015-05-07 NOTE — Telephone Encounter (Signed)
Voltaren gel was approved by [facility] from 05/04/15. 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

## 2015-05-17 ENCOUNTER — Ambulatory Visit: Admit: 2015-05-17 | Payer: PRIVATE HEALTH INSURANCE | Attending: Pain Medicine | Primary: Family Medicine

## 2015-05-17 DIAGNOSIS — G5601 Carpal tunnel syndrome, right upper limb: Secondary | ICD-10-CM

## 2015-05-17 MED ORDER — TRIAMCINOLONE ACETONIDE 10 MG/ML SUSP FOR INJECTION
10 mg/mL | Freq: Once | INTRAMUSCULAR | 0 refills | Status: AC
Start: 2015-05-17 — End: 2015-05-17

## 2015-05-17 MED ORDER — BUPIVACAINE (PF) 0.25 % (2.5 MG/ML) IJ SOLN
0.25 % (2.5 mg/mL) | Freq: Once | INTRAMUSCULAR | 0 refills | Status: AC
Start: 2015-05-17 — End: 2015-05-17

## 2015-05-17 NOTE — Progress Notes (Signed)
EMG/NCV RUE    1. MILD CTS  2.MILD ULNAR ENTRAPMENT AT THE CUBITAL TUNNEL

## 2015-05-17 NOTE — Progress Notes (Signed)
See scanned report    [facility]  OFFICE PROCEDURE PROGRESS NOTE        Chart reviewed for the following:   I, Steva Colder, MD, have reviewed the History, Physical and updated the Allergic reactions for [place] performed immediately prior to start of procedure:   I, Steva Colder, MD, have performed the following reviews on Cassandra Daniels prior to the start of the procedure:            * Patient was identified by name and date of birth   * Agreement on procedure being performed was verified  * Risks and Benefits explained to the patient  * Procedure site verified and marked as necessary  * Patient was positioned for comfort  * Consent was signed and verified     Time: 7124      Date of procedure: 05/17/2015    Procedure performed by:  Steva Colder, MD    Assisted PY:[id number]    How tolerated by patient: tolerated the procedure well with no complications    Comments: none     Patient Label  Signature:_____________________________    Time: 2505    Indications:   Symptom relief from osteoarthritis    Procedure:  After consent was obtained, using sterile technique the right carpometacarpal joint  was prepped using Chlorprep. Local anesthetic used: ethyl chloride topical.     Kenalog 20 mg was mixed with 0.5% marcaine 3 ml  and injected into the joint and the needle withdrawn.  The procedure was well tolerated.  The patient is asked to continue to rest the joint for a few more days before resuming regular activities.  It may be more painful for the first 1-2 days.  Watch for fever, or increased swelling or persistent pain in the joint. Call or return to clinic prn if such symptoms occur or there is failure to improve as anticipated.  --------------------------------------------------------------------------------------------------------------------------------------------------------------------------------------------------------     PAIN LEVEL AT CONCLUSION OF PROCEDURE:  5/10

## 2015-05-17 NOTE — Progress Notes (Signed)
Pt here for EMG. Pt will also get thumb injection. Pre injection pain scale rated at 4. Consent form signed.         EMG report faxed to Dr. Wynetta Emery at the [facility]. Location as requested. Given to [facility] for scanning.

## 2015-05-17 NOTE — Patient Instructions (Addendum)
Post Injection Instructions    If you develop any abnormal symptoms, such as itching, swelling, redness, rash, or shortness of breath, please call our office. Normally, these are temporary symptoms, which resolve within several hours to a day, but our office is more than happy to answer any questions you may have.    The provider would like you to observe the injection area for redness, swelling, or increased heat. If any or all of these reactions occur, please call our office as soon as possible. This reaction may indicate the first signs of infection. Although very rare, it is  best if caught early.    I have reviewed these instructions and the patient verbalizes understanding.    Current health maintenance issues were reviewed and the patient was advised to followup with his/her PCP for completion of these items.

## 2015-05-21 ENCOUNTER — Encounter: Attending: Orthopaedic Surgery | Primary: Family Medicine

## 2015-05-22 ENCOUNTER — Encounter: Attending: Orthopaedic Surgery | Primary: Family Medicine

## 2015-05-23 ENCOUNTER — Encounter: Attending: Orthopaedic Surgery | Primary: Family Medicine

## 2015-06-01 ENCOUNTER — Ambulatory Visit: Payer: PRIVATE HEALTH INSURANCE | Primary: Family Medicine

## 2015-06-04 ENCOUNTER — Ambulatory Visit
Admit: 2015-06-04 | Discharge: 2015-06-04 | Payer: PRIVATE HEALTH INSURANCE | Attending: Orthopaedic Surgery | Primary: Family Medicine

## 2015-06-04 DIAGNOSIS — G8929 Other chronic pain: Secondary | ICD-10-CM

## 2015-06-04 NOTE — Patient Instructions (Signed)
Knee Pain or Injury: Care Instructions  Your Care Instructions     Injuries are a common cause of knee problems. Sudden (acute) injuries may be caused by a direct blow to the knee. They can also be caused by abnormal twisting, bending, or falling on the knee. Pain, bruising, or swelling may be severe, and may start within minutes of the injury.  Overuse is another cause of knee pain. Other causes are climbing stairs, kneeling, and other activities that use the knee. Everyday wear and tear, especially as you get older, also can cause knee pain.  Rest, along with home treatment, often relieves pain and allows your knee to heal. If you have a serious knee injury, you may need tests and treatment.  Follow-up care is a key part of your treatment and safety. Be sure to make and go to all appointments, and call your doctor if you are having problems. It's also a good idea to know your test results and keep a list of the medicines you take.  How can you care for yourself at home?  ?? Be safe with medicines. Read and follow all instructions on the label.  ?? If the doctor gave you a prescription medicine for pain, take it as prescribed.  ?? If you are not taking a prescription pain medicine, ask your doctor if you can take an over-the-counter medicine.  ?? Rest and protect your knee. Take a break from any activity that may cause pain.  ?? Put ice or a cold pack on your knee for 10 to 20 minutes at a time. Put a thin cloth between the ice and your skin.  ?? Prop up a sore knee on a pillow when you ice it or anytime you sit or lie down for the next 3 days. Try to keep it above the level of your heart. This will help reduce swelling.  ?? If your knee is not swollen, you can put moist heat, a heating pad, or a warm cloth on your knee.  ?? If your doctor recommends an elastic bandage, sleeve, or other type of support for your knee, wear it as directed.  ?? Follow your doctor's instructions about how much weight you can put on  your leg. Use a cane, crutches, or a walker as instructed.  ?? Follow your doctor's instructions about activity during your healing process. If you can do mild exercise, slowly increase your activity.  ?? Reach and stay at a healthy weight. Extra weight can strain the joints, especially the knees and hips, and make the pain worse. Losing even a few pounds may help.  When should you call for help?  Call 911 anytime you think you may need emergency care. For example, call if:  ?? You have symptoms of a blood clot in your lung (called a pulmonary embolism). These may include:  ?? Sudden chest pain.  ?? Trouble breathing.  ?? Coughing up blood.  Call your doctor now or seek immediate medical care if:  ?? You have severe or increasing pain.  ?? Your leg or foot turns cold or changes color.  ?? You cannot stand or put weight on your knee.  ?? Your knee looks twisted or bent out of shape.  ?? You cannot move your knee.  ?? You have signs of infection, such as:  ?? Increased pain, swelling, warmth, or redness.  ?? Red streaks leading from the knee.  ?? Pus draining from a place on your knee.  ?? A   fever.  ?? You have signs of a blood clot in your leg (called a deep vein thrombosis), such as:  ?? Pain in your calf, back of the knee, thigh, or groin.  ?? Redness and swelling in your leg or groin.  Watch closely for changes in your health, and be sure to contact your doctor if:  ?? You have tingling, weakness, or numbness in your knee.  ?? You have any new symptoms, such as swelling.  ?? You have bruises from a knee injury that last longer than 2 weeks.  ?? You do not get better as expected.  Where can you learn more?  Go to http://www.healthwise.net/[employer]Help[employer]ctions  Enter K195 in the search box to learn more about "Knee Pain or Injury: Care Instructions."  ?? 2006-2016 [employer]. Care instructions adapted under license by [employer] (which disclaims liability or warranty  for this information). This care instruction is for use with your licensed healthcare professional. If you have questions about a medical condition or this instruction, always ask your healthcare professional. Healthwise, Incorporated disclaims any warranty or liability for your use of this information.  Content Version: 11.0.578772; Current as of: Jan 05, 2015

## 2015-06-04 NOTE — Progress Notes (Signed)
PATIENT PRESENTS FOR RIGHT KNEE F/U.

## 2015-06-04 NOTE — Progress Notes (Signed)
Patient: Cassandra Daniels                MRN: [id number]       SSN: [id number]  Date of Birth: 29-Nov-1960        AGE: 54 y.o.        SEX: female  There is no height or weight on file to calculate BMI.    PCP: Georgianne Fick, MD  06/04/15      No chief complaint on file.      HISTORY OF PRESENT ILLNESS: Cassandra Daniels is a 54 y.o. female who presents to the office for management of her R knee pain. Even though she had an MRI ordered she decided not to have it performed. Her main complaints are swelling in the back of the knee, a limp, and pain. She has had no locking or giving way.    Review of Systems   Constitutional: Negative.    HENT: Negative.    Eyes: Negative.    Respiratory: Negative.    Cardiovascular: Negative.    Gastrointestinal: Negative.    Genitourinary: Negative.    Musculoskeletal: Positive for joint pain (R knee).   Skin: Negative.    Neurological: Negative.    Endo/Heme/Allergies: Negative.    Psychiatric/Behavioral: Negative.            Social History     Social History   ??? Marital status: MARRIED     Spouse name: N/A   ??? Number of children: N/A   ??? Years of education: N/A     Occupational History   ??? Not on file.     Social History Main Topics   ??? Smoking status: Never Smoker   ??? Smokeless tobacco: Never Used   ??? Alcohol use No   ??? Drug use: No   ??? Sexual activity: Not on file     Other Topics Concern   ??? Not on file     Social History Narrative        Past Medical History   Diagnosis Date   ??? Arthritis    ??? Atherosclerotic cardiovascular disease    ??? Atrial fibrillation ([place])    ??? Bilateral knee pain    ??? Broken nose 1999     surgery   ??? Carpal tunnel syndrome    ??? Chronic fatigue    ??? Echocardiogram 05/24/2010     EF 60-65%.  Increased wall thickness.  Normal diastolic function.  RVSP 25-30 mmHg.     ??? Essential hypertension    ??? Fibrocystic breast    ??? Holter monitor study 03/24/2012     Sinus rhythm, avg HR 95 bpm.  Rare PVCs.  No sustained arrhythmias,  heart block, or pauses.   ??? HTN    ??? Hypothyroidism    ??? Left groin pain    ??? Left hip pain    ??? Meningioma nos    ??? Mitral (valve) prolapse    ??? MVP (mitral valve prolapse)    ??? Normal nuclear cardiac imaging test 07/07/2012     Low risk.  No ischemia or prior infarction.  EF >80%.  No WMA.  Neg EKG on pharm stress test.   ??? Pancreatitis 2014   ??? Paroxysmal atrial tachycardia ([place])    ??? PVC (premature ventricular contraction)    ??? Right hip pain    ??? Right shoulder pain    ??? SVT (supraventricular tachycardia)    ??? Unspecified adverse effect of anesthesia  difficulty waking up    ??? Unspecified sleep apnea      uses cpap machine   ??? Vitamin B 12 deficiency         Allergies   Allergen Reactions   ??? Beta Blocker [Beta-Blockers (Beta-Adrenergic Blocking Agts)] Other (comments)     Fatigue   ??? Tetracycline Hives         Current Outpatient Prescriptions   Medication Sig   ??? MONTELUKAST SODIUM (SINGULAIR PO) Take  by mouth.   ??? [START ON 06/16/2015] HYDROcodone-acetaminophen (NORCO) 7.5-325 mg per tablet Take 1 Tab by mouth two (2) times daily as needed for Pain for up to 30 days. Max Daily Amount: 2 Tabs. Indications: PAIN   ??? HYDROcodone-acetaminophen (NORCO) 7.5-325 mg per tablet Take 1 Tab by mouth two (2) times daily as needed for Pain for up to 30 days. Max Daily Amount: 2 Tabs. Indications: PAIN   ??? traMADol (ULTRAM) 50 mg tablet Take 1 Tab by mouth every eight (8) hours as needed for Pain. Max Daily Amount: 150 mg.   ??? CYCLOBENZAPRINE HCL (FLEXERIL PO) Take 10 mg by mouth.   ??? zolpidem CR (AMBIEN CR) 6.25 mg tablet Take 6.25 mg by mouth nightly as needed for Sleep.   ??? HYDROcodone-acetaminophen (NORCO) 5-325 mg per tablet Take 1 Tab by mouth every eight (8) hours as needed for Pain. Max Daily Amount: 3 Tabs.   ??? venlafaxine-SR (EFFEXOR XR) 150 mg capsule Take  by mouth daily.   ??? ascorbic acid (VITAMIN C) 250 mg tablet Take 250 mg by mouth daily.    ??? ferrous sulfate (IRON) 325 mg (65 mg iron) tablet Take 325 mg by mouth Daily (before breakfast).   ??? BENAZEPRIL HCL (LOTENSIN PO) Take 5 mg by mouth daily.   ??? primidone (MYSOLINE) 50 mg tablet TAKE 1 TABLET BY MOUTH EVERY MORNING AND 2 TABLETS BY MOUTH EVERY NIGHT AT BEDTIME (Patient taking differently: daily.)   ??? clonazePAM (KLONOPIN) 0.5 mg tablet Take 1 Tab by mouth nightly as needed.   ??? hyoscyamine SL (LEVSIN/SL) 0.125 mg SL tablet Take 1 Tab by mouth every six (6) hours as needed for Cramping.   ??? Cholecalciferol, Vitamin D3, (VITAMIN D) 1,000 unit Cap Take 1,000 Int'l Units by mouth two (2) times a day.   ??? CYANOCOBALAMIN, VITAMIN B-12, (VITAMIN B-12 PO) Take 1 Tab by mouth daily.     No current facility-administered medications for this visit.         Physical Exam  Constitutional: she is oriented to person, place, and time and well-developed, well-nourished, and in no distress. No distress.   HENT:   Head: Normocephalic and atraumatic.   Right Ear: Hearing normal.   Left Ear: Hearing normal.   Nose: Nose normal.   Eyes: Conjunctivae, EOM and lids are normal. Pupils are equal, round, and reactive to light.   Neck: Trachea normal.   Pulmonary/Chest: Effort normal and breath sounds normal. No respiratory distress.   Abdominal: Soft.   Neurological: she is alert and oriented to person, place, and time.   Skin: Skin is warm, dry and intact. she is not diaphoretic.   Psychiatric: Affect normal.   Nursing note and vitals reviewed.    Ortho Exam  R knee: No obvious difference in regards to swelling. As before ligaments were stable and ROM was normal.     RADIOGRAPHS: X-rays show medial joint space narrowing of B/L knees with no dramatic difference between the symptomatic R knee and asymptomatic  L knee on the AP view but on the tunnel view narrowing seems to be more distinct.    IMPRESSION & PLAN: I feel her R knee pain is due to medial compartment OA  which is also causing the swelling and stiffness.  I explained to her that because of her young age she is not a good candidate for a knee replacement. However, I assured her that if she had any doubts she should see Dr. Luther Redo. I will see her back PRN.      Scribed by Myer Peer ([place]) as dictated by Cipriano Bunker, MD.

## 2015-06-21 ENCOUNTER — Ambulatory Visit: Payer: PRIVATE HEALTH INSURANCE | Primary: Family Medicine

## 2015-07-19 ENCOUNTER — Ambulatory Visit: Attending: Orthopaedic Surgery | Primary: Family Medicine

## 2015-07-19 ENCOUNTER — Ambulatory Visit
Admit: 2015-07-19 | Discharge: 2015-07-19 | Payer: PRIVATE HEALTH INSURANCE | Attending: Orthopaedic Surgery | Primary: Family Medicine

## 2015-07-19 DIAGNOSIS — M1711 Unilateral primary osteoarthritis, right knee: Secondary | ICD-10-CM

## 2015-07-19 NOTE — Progress Notes (Signed)
Patient: Cassandra Daniels                MRN: [id number]       SSN: [id number]  Date of Birth: 1960/10/06        AGE: 54 y.o.        SEX: female  Body mass index is 32.94 kg/(m^2).    PCP: Georgianne Fick, MD  07/19/15    HISTORY: I met her probably 10 years ago.  She has had several scopes on each knee, back problems, labral tears in each hip, which is occasionally symptomatic, and a previous rotator cuff by Dr. Marlis Edelson doing very well.      She is thinking about knee replacement surgery.  We had a lengthy discussion regarding risks and benefits, including but not limited to infection, DVT, pulmonary embolism, anesthetic complications, blood loss requiring transfusion, pain, stiffness, and other complications. She is able to wear a 3.5 inch heel, and she can walk, some days, up to an hour or an hour and a half, other days, 10-15 minutes.  Night pain is a feature and just some days are worse than others.  She has exhausted all nonoperative management.  She has had viscosupplementation, therapy, cortisone, and anti-inflammatories.      PHYSICAL EXAMINATION:  On examination today, she is a very nice lady.  She appears younger than her stated age.  The hips rotate well.  There is almost full motion in both knees.  She has a positive impingement test for labral tear of the right hip.  The calf is nontender.  Homan's sign is negative.  There is just mild evidence of neuropathy distally.      RADIOGRAPHS:  Review of her x-rays, including AP, tunnel, lateral, and skyline, confirms severe end-staged arthritis involving the right knee.     PLAN:  I would not hesitate to recommend a knee replacement when she would like.  Having said that, she is still very functional.  It is really up to her.  We also discussed chronic pain in knee replacements, implant longevity, and advances in arthroplasty.  It is really up to her as to when she would like surgery, and it has been an absolute pleasure to share  in her care.           REVIEW OF SYSTEMS:      CON: negative for weight loss, fever  EYE: negative for double vision  ENT: negative for hoarseness  RS:   negative for Tb  GI:    negative for blood in stool  GU:  negative for blood in urine  Other systems reviewed and noted below.          Past Medical History   Diagnosis Date   ??? Arthritis    ??? Atherosclerotic cardiovascular disease    ??? Atrial fibrillation ([place])    ??? Bilateral knee pain    ??? Broken nose 1999     surgery   ??? Carpal tunnel syndrome    ??? Chronic fatigue    ??? Echocardiogram 05/24/2010     EF 60-65%.  Increased wall thickness.  Normal diastolic function.  RVSP 25-30 mmHg.     ??? Essential hypertension    ??? Fibrocystic breast    ??? Holter monitor study 03/24/2012     Sinus rhythm, avg HR 95 bpm.  Rare PVCs.  No sustained arrhythmias, heart block, or pauses.   ??? HTN    ??? Hypothyroidism    ??? Left groin  pain    ??? Left hip pain    ??? Meningioma nos    ??? Mitral (valve) prolapse    ??? MVP (mitral valve prolapse)    ??? Normal nuclear cardiac imaging test 07/07/2012     Low risk.  No ischemia or prior infarction.  EF >80%.  No WMA.  Neg EKG on pharm stress test.   ??? Pancreatitis 2014   ??? Paroxysmal atrial tachycardia ([place])    ??? PVC (premature ventricular contraction)    ??? Right hip pain    ??? Right shoulder pain    ??? SVT (supraventricular tachycardia)    ??? Unspecified adverse effect of anesthesia      difficulty waking up    ??? Unspecified sleep apnea      uses cpap machine   ??? Vitamin B 12 deficiency        Family History   Problem Relation Age of Onset   ??? Asthma Mother    ??? Breast Cancer Mother 91   ??? Cancer Mother    ??? Other Father      cabg   ??? Heart Disease Father    ??? High Cholesterol Father    ??? Heart Attack Maternal Grandmother 34   ??? Breast Cancer Maternal Grandmother 56       Current Outpatient Prescriptions   Medication Sig Dispense Refill   ??? traMADol (ULTRAM) 50 mg tablet Take 1 Tab by mouth every eight (8) hours  as needed for Pain. Max Daily Amount: 150 mg. 60 Tab 3   ??? CYCLOBENZAPRINE HCL (FLEXERIL PO) Take 10 mg by mouth.     ??? zolpidem CR (AMBIEN CR) 6.25 mg tablet Take 6.25 mg by mouth nightly as needed for Sleep.     ??? HYDROcodone-acetaminophen (NORCO) 5-325 mg per tablet Take 1 Tab by mouth every eight (8) hours as needed for Pain. Max Daily Amount: 3 Tabs. 15 Tab 0   ??? venlafaxine-SR (EFFEXOR XR) 150 mg capsule Take  by mouth daily.     ??? ascorbic acid (VITAMIN C) 250 mg tablet Take 250 mg by mouth daily.     ??? ferrous sulfate (IRON) 325 mg (65 mg iron) tablet Take 325 mg by mouth Daily (before breakfast).     ??? primidone (MYSOLINE) 50 mg tablet TAKE 1 TABLET BY MOUTH EVERY MORNING AND 2 TABLETS BY MOUTH EVERY NIGHT AT BEDTIME (Patient taking differently: daily.) 270 Tab 3   ??? clonazePAM (KLONOPIN) 0.5 mg tablet Take 1 Tab by mouth nightly as needed. 90 Tab 0   ??? hyoscyamine SL (LEVSIN/SL) 0.125 mg SL tablet Take 1 Tab by mouth every six (6) hours as needed for Cramping. 30 Tab 3   ??? Cholecalciferol, Vitamin D3, (VITAMIN D) 1,000 unit Cap Take 1,000 Int'l Units by mouth two (2) times a day.     ??? CYANOCOBALAMIN, VITAMIN B-12, (VITAMIN B-12 PO) Take 1 Tab by mouth daily.     ??? diclofenac (VOLTAREN) 1 % gel Apply  to affected area four (4) times daily.     ??? MONTELUKAST SODIUM (SINGULAIR PO) Take  by mouth.     ??? BENAZEPRIL HCL (LOTENSIN PO) Take 5 mg by mouth daily.         Allergies   Allergen Reactions   ??? Beta Blocker [Beta-Blockers (Beta-Adrenergic Blocking Agts)] Other (comments)     Fatigue   ??? Tetracycline Hives       Past Surgical History   Procedure Laterality Date   ???  Hx knee arthroscopy     ??? Hx rhinoplasty     ??? Hx hysterectomy     ??? 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 Hx bladder repair  1996   ??? Ablation of svt  2007     by Dr. Serita Sheller   ??? Mam biopsy breast stereotactic  april, 2013     right, benign   ??? Hx cholecystectomy  2014   ??? Hx shoulder arthroscopy         Social History     Social History    ??? Marital status: MARRIED     Spouse name: N/A   ??? Number of children: N/A   ??? Years of education: N/A     Occupational History   ??? Not on file.     Social History Main Topics   ??? Smoking status: Never Smoker   ??? Smokeless tobacco: Never Used   ??? Alcohol use No   ??? Drug use: No   ??? Sexual activity: Not on file     Other Topics Concern   ??? Not on file     Social History Narrative       Visit Vitals   ??? BP 135/90   ??? Pulse 81   ??? Temp 97 ??F (36.1 ??C) (Oral)   ??? Ht 5' 2.5" (1.588 m)   ??? Wt 183 lb (83 kg)   ??? BMI 32.94 kg/m2         PHYSICAL EXAMINATION:  GENERAL: Alert and oriented x3, in no acute distress, well-developed, well-nourished, afebrile.  HEART: No JVD.  EYES: No scleral icterus   NECK: No significant lymphadenopathy   LUNGS: No respiratory compromise or indrawing  ABDOMEN: Soft, non-tender, non-distended.           Electronically signed by: Shirlee Latch, MD

## 2015-07-19 NOTE — Progress Notes (Signed)
Chief Complaint   Patient presents with   ??? Knee Pain     right

## 2015-07-19 NOTE — Patient Instructions (Addendum)
The patient is instructed to follow up as needed. They were advised to contact us if their condition worsens.      Deciding About Knee Replacement Surgery  What is knee replacement surgery?     Knee replacement surgery replaces the worn ends of the thighbone (femur) and the lower leg bone (tibia) where they meet at the knee. Your doctor will take out the damaged bone and replace it with plastic and metal parts. These new parts may be attached to your bones with cement.  You will need a lot of rehabilitation, or rehab, after surgery. This takes several weeks. But you should be able to start to walk, climb stairs, sit in and get up from chairs, and do other daily movements within a few days.  What are key points about this decision?  ?? The decision you and your doctor make depends on how much pain and disability you have. It also depends on your age, health, and activity level.  ?? Most people have this surgery only when they can no longer control knee pain with other treatments and when the pain disrupts their lives.  ?? Rehab after this surgery means doing daily exercises for several weeks.  ?? Most knee replacements last for at least 15 years. You may need to have the knee replaced again.  Why might you choose to replace your knee?  ?? You are not able to do most of your activities without pain.  ?? You have very bad arthritis pain. Other treatments have not helped.  ?? You do not have other health problems that would make surgery too risky.  ?? You are not worried that you may need to have another knee replacement later in life.  ?? You aren't worried about side effects. These may include infections, blood clots, and loss of range of motion.  ?? You are willing to do weeks of rehab.  ?? You want to have the surgery before you lose too much of your ability to be active. If you are not able to stay active, strong, and flexible before the surgery, it can be harder to return to your normal activities after the surgery.   Why might you choose not to replace your knee?  ?? You can still do most of your activities.  ?? You have other health problems that may make surgery too risky.  ?? You want to try all other treatments first.  ?? You want to avoid the side effects of surgery.  ?? You can't do rehab after surgery.  ?? You worry that you may need another knee replacement later in life.  Your decision  Thinking about the facts and your feelings can help you make a decision that is right for you. Be sure you understand the benefits and risks of your options, and think about what else you need to do before you make the decision.  Where can you learn more?  Go to [employer]  Enter X279 in the search box to learn more about "Deciding About Knee Replacement Surgery."  ?? 2006-2016 [employer]. Care instructions adapted under license by [employer] (which disclaims liability or warranty for this information). This care instruction is for use with your licensed healthcare professional. If you have questions about a medical condition or this instruction, always ask your healthcare professional. [place] any warranty or liability for your use of this information.  Content Version: 11.0.578772; Current as of: Jan 01, 2015

## 2015-07-25 ENCOUNTER — Ambulatory Visit: Attending: Pain Medicine | Primary: Family Medicine

## 2015-07-25 ENCOUNTER — Ambulatory Visit
Admit: 2015-07-25 | Discharge: 2015-07-25 | Payer: PRIVATE HEALTH INSURANCE | Attending: Pain Medicine | Primary: Family Medicine

## 2015-07-25 DIAGNOSIS — M1711 Unilateral primary osteoarthritis, right knee: Secondary | ICD-10-CM

## 2015-07-25 LAB — AMB POC DRUG SCREEN (G0477)
AMPHETAMINES UR POC: NEGATIVE
BARBITURATES UR POC: NEGATIVE
BENZODIAZEPINES UR POC: NEGATIVE
CANNABINOIDS UR POC: NEGATIVE
COCAINE UR POC: NEGATIVE
MDMA/ECSTASY UR POC: NEGATIVE
METHADONE UR POC: NEGATIVE
METHAMPHETAMINE UR POC: NEGATIVE
METHYLPHENIDATE UR POC: NEGATIVE
OPIATES UR POC: NEGATIVE
OXYCODONE UR POC: NEGATIVE
PHENCYCLIDINE UR POC: NEGATIVE
TRICYCLICS UR POC: NEGATIVE

## 2015-07-25 MED ORDER — TRAMADOL 50 MG TAB
50 mg | ORAL_TABLET | Freq: Three times a day (TID) | ORAL | 5 refills | Status: DC | PRN
Start: 2015-07-25 — End: 2016-01-01

## 2015-07-25 MED ORDER — HYDROCODONE-ACETAMINOPHEN 7.5 MG-325 MG TAB
ORAL_TABLET | Freq: Two times a day (BID) | ORAL | 0 refills | Status: DC | PRN
Start: 2015-07-25 — End: 2016-01-01

## 2015-07-25 MED ORDER — ZOLPIDEM SR 12.5 MG MULTIPHASE TAB
12.5 mg | ORAL_TABLET | Freq: Every evening | ORAL | 2 refills | Status: DC | PRN
Start: 2015-07-25 — End: 2016-01-01

## 2015-07-25 NOTE — Progress Notes (Signed)
Nursing Notes    Patient presents to the office today in follow-up.   Patient rates her pain at 2/10 on the numerical pain scale.     Reviewed medications with counts as follows:    Rx Date filled Qty Dispensed Pill Count Last Dose Short   Tramadol 50 07/01/15 60 25 07/24/15 no     Pt did not bring Rx for norco; counseling done and PMP shows:filled 05/29/15, has #3 left. Not short. Did not bring klonopin, filled 12/07/14 and has plenty left.     Comments: has script for norco to fill 06/16/15    POC UDS was performed in office today    Any new labs or imaging since last appointment? YES, blood work for c/u    Have you been to an emergency room (ER) or urgent care clinic since your last visit?  NO            Have you been hospitalized since your last visit? NO     If yes, where, when, and reason for visit?     Have you seen or consulted any other health care providers outside of the [place]  since your last visit?  NO     If yes, where, when, and reason for visit?     Ms. Plachy has a reminder for a "due or due soon" health maintenance. I have asked that she contact her primary care provider for follow-up on this health maintenance.

## 2015-07-25 NOTE — Patient Instructions (Signed)
Current health maintenance issues were reviewed and the patient was advised to followup with his/her PCP for completion of these items.

## 2015-07-25 NOTE — Progress Notes (Signed)
HISTORY OF PRESENT ILLNESS  Cassandra Daniels is a 54 y.o. female.  HPI she returns for follow-up of chronic, severe pain involving the right wrist and in particular the first carpometacarpal joint of the right hand as well as the right knee which she suffers from primary osteoarthritic change.  She also suffers from right carpal and cubital tunnels.    She has been using her compound ointments and has found this to be quite effective.  Pain has been under excellent control.  Pain level today to out of 10, outcome 6/28,(The lower the upper number, the better the outcome)  Physical activity and mobility are fairly good, mood is good, sleep is fair.  No reported side effects.    A review of the [place] prescription monitoring program does not identify any inconsistency.    UDS obtained and reviewed; formal confirmation from laboratory is pending.      Review of Systems   Constitutional: Positive for malaise/fatigue.   Gastrointestinal: Positive for constipation, heartburn and nausea.   Musculoskeletal: Positive for back pain, joint pain (polyarticular), myalgias and neck pain.   Neurological: Positive for weakness (generalized).   Psychiatric/Behavioral: The patient has insomnia.    All other systems reviewed and are negative.      Physical Exam   Constitutional: She is oriented to person, place, and time. She appears cachectic. She has a sickly appearance. She appears distressed.   HENT:   Head: Normocephalic and atraumatic.   Right Ear: External ear normal.   Left Ear: External ear normal.   Nose: Nose normal.   Mouth/Throat: Oropharynx is clear and moist. No oropharyngeal exudate.   Eyes: Conjunctivae and EOM are normal. Pupils are equal, round, and reactive to light. Right eye exhibits no discharge. Left eye exhibits no discharge. No scleral icterus.   Neck: Muscular tenderness: spasm. Decreased range of motion present. No Brudzinski's sign and no Kernig's sign noted. No thyromegaly present.    Cardiovascular: Normal rate, regular rhythm and normal heart sounds.    Pulmonary/Chest: Effort normal and breath sounds normal. No respiratory distress. She has no wheezes. She has no rales.   Abdominal: Soft. She exhibits no distension. There is no tenderness. There is no rebound and no guarding.   Musculoskeletal: She exhibits tenderness.        Right shoulder: She exhibits decreased range of motion and tenderness.        Left shoulder: She exhibits decreased range of motion and tenderness.        Right elbow: She exhibits decreased range of motion. Tenderness found.        Left elbow: She exhibits decreased range of motion. Tenderness found.        Right wrist: She exhibits decreased range of motion and tenderness.        Left wrist: She exhibits decreased range of motion and tenderness.        Right hip: She exhibits decreased range of motion and tenderness.        Left hip: She exhibits decreased range of motion and tenderness.        Right knee: She exhibits decreased range of motion. Tenderness found.        Left knee: She exhibits decreased range of motion. Tenderness found.        Right ankle: She exhibits decreased range of motion. Tenderness.        Left ankle: She exhibits decreased range of motion. Tenderness.        Lumbar  back: She exhibits decreased range of motion, tenderness, pain and spasm.   Neurological: She is alert and oriented to person, place, and time. She has normal reflexes. No cranial nerve deficit or sensory deficit. She exhibits normal muscle tone. Gait abnormal. Coordination normal.   Reflex Scores:       Tricep reflexes are 2+ on the right side and 2+ on the left side.       Bicep reflexes are 2+ on the right side and 2+ on the left side.       Brachioradialis reflexes are 2+ on the right side and 2+ on the left side.       Patellar reflexes are 2+ on the right side and 2+ on the left side.       Achilles reflexes are 2+ on the right side and 2+ on the left side.   Skin: Skin is warm. No rash noted.   Psychiatric: Her speech is normal and behavior is normal. Judgment and thought content normal. She exhibits a depressed mood.       ASSESSMENT and PLAN  Encounter Diagnoses   Name Primary?   ??? Primary osteoarthritis of right knee Yes   ??? Encounter for long-term (current) use of medications    ??? Right hand pain    ??? Cubital tunnel syndrome, right    ??? Carpal tunnel syndrome of right wrist    ??? Primary osteoarthritis of first carpometacarpal joint of right hand    ??? Chronic pain of right knee      She will continue on her current analgesic regimen as this is running excellent pain control with improve functionality and minimal side effects.  2-3 month reassess her    No concerns are raised for misuse, abuse, or diversion.    1. Pain medications are prescribed with the objective of pain relief and improved physical and psychosocial function in this patient.  2. Counseled patient on proper use of prescribed medications and reviewed opioid contract.  3. Counseled patient about chronic medical conditions and their relationship to anxiety and depression and recommended mental health support as needed.  4. Reviewed with patient self-help tools, home exercise, and lifestyle changes to assist the patient in self-management of symptoms.  5. Advised patient to have a primary care provider to continue care for health maintenance and general medical conditions and support for referral to specialty care as needed.  6. Reviewed with patient the treatment plan, goals of treatment plan, and limitations of treatment plan, to include the potential for side effects from medications and procedures. If side effects occur, it is the responsibility of the patient to inform the clinic so that a change in the treatment plan can be made in a safe manner. The patient is advised that stopping prescribed medication may cause an increase in symptoms and  possible medication withdrawal symptoms. The patient is informed an emergency room evaluation may be necessary if this occurs.  DISPOSITION: The patient???s condition and plan were discussed at length and all questions were answered. The patient agrees with the plan.    Counseling occupied > 50% of visit:  Total time: 30 minutes

## 2015-07-26 DIAGNOSIS — M1711 Unilateral primary osteoarthritis, right knee: Secondary | ICD-10-CM

## 2015-08-16 ENCOUNTER — Encounter: Attending: Physician Assistant | Primary: Family Medicine

## 2015-10-05 NOTE — Telephone Encounter (Signed)
Patient left a voicemail requesting an early refill on [place] and tramadol because she is traveling the first week of March

## 2015-10-08 NOTE — Telephone Encounter (Signed)
Pt called requesting update on her request for filling the tramadol and Ambien. Chart review show tramadol script printed to fill 10/21/15 and pt should have 2 more refills on the Ambien. She relays her current supply of tramadol should last until that fill date. Advised to check pharmacy for both.

## 2015-10-11 ENCOUNTER — Encounter: Attending: Internal Medicine | Primary: Family Medicine

## 2015-10-29 NOTE — Progress Notes (Signed)
Pt injured herself at work and went to [employer]. Advised to continue taking her pain meds from [place], no additional meds given. Will have return visit if not better.

## 2015-12-12 NOTE — Progress Notes (Signed)
Request for records from [facility] and fax request to [employer] to be process 12/12/2015.

## 2016-01-01 ENCOUNTER — Ambulatory Visit
Admit: 2016-01-01 | Discharge: 2016-01-01 | Payer: PRIVATE HEALTH INSURANCE | Attending: Pain Medicine | Primary: Family Medicine

## 2016-01-01 DIAGNOSIS — G5601 Carpal tunnel syndrome, right upper limb: Secondary | ICD-10-CM

## 2016-01-01 LAB — AMB POC DRUG SCREEN (G0477)
AMPHETAMINES UR POC: NEGATIVE
BARBITURATES UR POC: NEGATIVE
BENZODIAZEPINES UR POC: NEGATIVE
CANNABINOIDS UR POC: NEGATIVE
COCAINE UR POC: NEGATIVE
MDMA/ECSTASY UR POC: NEGATIVE
METHADONE UR POC: NEGATIVE
METHAMPHETAMINE UR POC: NEGATIVE
METHYLPHENIDATE UR POC: NEGATIVE
OPIATES UR POC: NEGATIVE
OXYCODONE UR POC: NEGATIVE
PHENCYCLIDINE UR POC: NEGATIVE
TRICYCLICS UR POC: NEGATIVE

## 2016-01-01 MED ORDER — TRAMADOL 50 MG TAB
50 mg | ORAL_TABLET | Freq: Three times a day (TID) | ORAL | 5 refills | Status: DC | PRN
Start: 2016-01-01 — End: 2016-05-13

## 2016-01-01 MED ORDER — CYCLOBENZAPRINE 10 MG TAB
10 mg | ORAL_TABLET | Freq: Three times a day (TID) | ORAL | 5 refills | Status: AC | PRN
Start: 2016-01-01 — End: 2016-01-31

## 2016-01-01 MED ORDER — CLONAZEPAM 0.5 MG TAB
0.5 mg | ORAL_TABLET | Freq: Every evening | ORAL | 5 refills | Status: AC | PRN
Start: 2016-01-01 — End: 2016-02-07

## 2016-01-01 MED ORDER — HYDROCODONE-ACETAMINOPHEN 7.5 MG-325 MG TAB
ORAL_TABLET | Freq: Two times a day (BID) | ORAL | 0 refills | Status: DC | PRN
Start: 2016-01-01 — End: 2016-01-17

## 2016-01-01 MED ORDER — ZOLPIDEM SR 12.5 MG MULTIPHASE TAB
12.5 mg | ORAL_TABLET | Freq: Every evening | ORAL | 5 refills | Status: DC | PRN
Start: 2016-01-01 — End: 2016-05-13

## 2016-01-01 MED ORDER — HYDROCODONE-ACETAMINOPHEN 7.5 MG-325 MG TAB
ORAL_TABLET | Freq: Two times a day (BID) | ORAL | 0 refills | Status: DC | PRN
Start: 2016-01-01 — End: 2016-05-13

## 2016-01-01 NOTE — Progress Notes (Signed)
HISTORY OF PRESENT ILLNESS  Cassandra Daniels is a 55 y.o. female.  HPIshe returns for follow-up of chronic, severe pain involving the right wrist and in particular the first carpometacarpal joint of the right hand as well as the right knee which she suffers from primary osteoarthritic change. She also suffers from right carpal and cubital tunnels.  Pain has been under excellent control, averaging 4 out of 10.  Pain level today 4 out of 10, outcome 7/28,(The lower the upper number, the better the outcome)  Mobility, mood, and sleep are all good.  No reported side effects.    A current review of the PMP does not identify any inconsistency.  UDS obtained and reviewed; formal confirmation from laboratory is pending.         Review of Systems   Constitutional: Positive for malaise/fatigue.   Gastrointestinal: Positive for constipation, heartburn and nausea.   Musculoskeletal: Positive for back pain, joint pain (polyarticular), myalgias and neck pain.   Neurological: Positive for weakness (generalized).   Psychiatric/Behavioral: The patient has insomnia.    All other systems reviewed and are negative.      Physical Exam   Constitutional: She is oriented to person, place, and time. She appears cachectic. She has a sickly appearance. No distress.   HENT:   Head: Normocephalic and atraumatic.   Right Ear: External ear normal.   Left Ear: External ear normal.   Nose: Nose normal.   Mouth/Throat: Oropharynx is clear and moist. No oropharyngeal exudate.   Eyes: Conjunctivae and EOM are normal. Pupils are equal, round, and reactive to light. Right eye exhibits no discharge. Left eye exhibits no discharge. No scleral icterus.   Neck: Muscular tenderness: spasm. Decreased range of motion present. No Brudzinski's sign and no Kernig's sign noted. No thyromegaly present.   Cardiovascular: Normal rate, regular rhythm and normal heart sounds.    Pulmonary/Chest: Effort normal and breath sounds normal. No respiratory  distress. She has no wheezes. She has no rales.   Abdominal: Soft. She exhibits no distension. There is no tenderness. There is no rebound and no guarding.   Musculoskeletal: She exhibits tenderness.        Right shoulder: She exhibits decreased range of motion and tenderness.        Left shoulder: She exhibits decreased range of motion and tenderness.        Right elbow: She exhibits decreased range of motion. Tenderness found.        Left elbow: She exhibits decreased range of motion. Tenderness found.        Right wrist: She exhibits decreased range of motion and tenderness.        Left wrist: She exhibits decreased range of motion and tenderness.        Right hip: She exhibits decreased range of motion and tenderness.        Left hip: She exhibits decreased range of motion and tenderness.        Right knee: She exhibits decreased range of motion. Tenderness found.        Left knee: She exhibits decreased range of motion. Tenderness found.        Right ankle: She exhibits decreased range of motion. Tenderness.        Left ankle: She exhibits decreased range of motion. Tenderness.        Lumbar back: She exhibits decreased range of motion, tenderness, pain and spasm.   Neurological: She is alert and oriented to person, place, and  time. She has normal reflexes. No cranial nerve deficit or sensory deficit. She exhibits normal muscle tone. Gait abnormal. Coordination normal.   Reflex Scores:       Tricep reflexes are 2+ on the right side and 2+ on the left side.       Bicep reflexes are 2+ on the right side and 2+ on the left side.       Brachioradialis reflexes are 2+ on the right side and 2+ on the left side.       Patellar reflexes are 2+ on the right side and 2+ on the left side.       Achilles reflexes are 2+ on the right side and 2+ on the left side.  Skin: Skin is warm. No rash noted.   Psychiatric: Her speech is normal and behavior is normal. Judgment and  thought content normal. She exhibits a depressed mood.       ASSESSMENT and PLAN  Encounter Diagnoses   Name Primary?   ??? Carpal tunnel syndrome of right wrist Yes   ??? Encounter for long-term (current) use of high-risk medication    ??? Right hand pain    ??? Anxiety    ??? Cubital tunnel syndrome, right    ??? Chronic right shoulder pain    ??? Right hip pain    ??? Left groin pain    ??? Primary osteoarthritis of first carpometacarpal joint of right hand    ??? Chronic pain of right knee    ??? Primary osteoarthritis of right knee      She will continue on her current analgesic regimen as this is providing excellent pain control with improve functionality and minimal side effects.  3 month reassess her    No concerns are raised for misuse, abuse, or diversion.    1. Pain medications are prescribed with the objective of pain relief and improved physical and psychosocial function in this patient.  2. Counseled patient on proper use of prescribed medications and reviewed opioid contract.  3. Counseled patient about chronic medical conditions and their relationship to anxiety and depression and recommended mental health support as needed.  4. Reviewed with patient self-help tools, home exercise, and lifestyle changes to assist the patient in self-management of symptoms.  5. Advised patient to have a primary care provider to continue care for health maintenance and general medical conditions and support for referral to specialty care as needed.  6. Reviewed with patient the treatment plan, goals of treatment plan, and limitations of treatment plan, to include the potential for side effects from medications and procedures. If side effects occur, it is the responsibility of the patient to inform the clinic so that a change in the treatment plan can be made in a safe manner. The patient is advised that stopping prescribed medication may cause an increase in symptoms and possible medication withdrawal symptoms. The patient is informed an  emergency room evaluation may be necessary if this occurs.  DISPOSITION: The patient???s condition and plan were discussed at length and all questions were answered. The patient agrees with the plan.    Counseling occupied > 50% of visit:  Total time:30 minutes

## 2016-01-01 NOTE — Progress Notes (Signed)
Nursing Notes    Patient presents to the office today in follow-up.   Patient rates her pain at 4/10 on the numerical pain scale.     Reviewed medications with counts as follows:    Rx Date filled Qty Dispensed Pill Count Last Dose Short   Tramadol 50 12/11/15 60 0 01/01/16 ?   norco  11/01/15 40 0 One month No    ambien 12.5 11/28/15 30 13.2 12/31/15 no   klonopin 12/10/15 90 5 01/01/16 no                    Comments:     POC UDS was performed in office today    Any new labs or imaging since last appointment? YES, MRI of brain    Have you been to an emergency room (ER) or urgent care clinic since your last visit?  NO            Have you been hospitalized since your last visit? NO     If yes, where, when, and reason for visit?     Have you seen or consulted any other health care providers outside of the [place]  since your last visit?  NO     If yes, where, when, and reason for visit?     [name] has a reminder for a "due or due soon" health maintenance. I have asked that she contact her primary care provider for follow-up on this health maintenance.

## 2016-01-01 NOTE — Patient Instructions (Addendum)
Current health maintenance issues were reviewed and the patient was advised to followup with his/her PCP for completion of these items.

## 2016-01-10 ENCOUNTER — Ambulatory Visit: Attending: Pain Medicine | Primary: Family Medicine

## 2016-01-10 ENCOUNTER — Ambulatory Visit
Admit: 2016-01-10 | Discharge: 2016-01-10 | Payer: PRIVATE HEALTH INSURANCE | Attending: Pain Medicine | Primary: Family Medicine

## 2016-01-10 DIAGNOSIS — M1711 Unilateral primary osteoarthritis, right knee: Secondary | ICD-10-CM

## 2016-01-10 MED ORDER — TRIAMCINOLONE ACETONIDE 40 MG/ML SUSP FOR INJECTION
40 mg/mL | Freq: Once | INTRAMUSCULAR | 0 refills | Status: AC
Start: 2016-01-10 — End: 2016-01-10

## 2016-01-10 MED ORDER — BUPIVACAINE (PF) 0.25 % (2.5 MG/ML) IJ SOLN
0.25 % (2.5 mg/mL) | Freq: Once | INTRAMUSCULAR | 0 refills | Status: AC
Start: 2016-01-10 — End: 2016-01-10

## 2016-01-10 NOTE — Progress Notes (Signed)
Pt here for injections. Pre injection pain scale rated at 8.

## 2016-01-10 NOTE — Progress Notes (Signed)
See scanned report    [facility]  OFFICE PROCEDURE PROGRESS NOTE        Chart reviewed for the following:   I, Ileana Roup, RN, have reviewed the History, Physical and updated the Allergic reactions for [place] performed immediately prior to start of procedure:   I, Steva Colder, M.D. have performed the following reviews on Cassandra Daniels prior to the start of the procedure:            * Patient was identified by name and date of birth   * Agreement on procedure being performed was verified  * Risks and Benefits explained to the patient  * Procedure site verified and marked as necessary  * Patient was positioned for comfort  * Consent was signed and verified     Time: 3:17 PM       Date of procedure: 01/10/2016    Procedure performed by:  Steva Colder, MD    Assisted RR:[id number]    How tolerated by patient: tolerated the procedure well with no complications    Comments: none     Patient Label  Signature:_____________________________    Indications:   Symptom relief from osteoarthritis    Procedure:  After consent was obtained, using sterile technique the right wrist and right knee was prepped using Chlorprep. Local anesthetic used: ethyl chloride topical.     Kenalog 60 mg was mixed with 0.5% marcaine 8 ml  and injected into the joint and the needle withdrawn.  The procedure was well tolerated.  The patient is asked to continue to rest the joint for a few more days before resuming regular activities.  It may be more painful for the first 1-2 days.  Watch for fever, or increased swelling or persistent pain in the joint. Call or return to clinic prn if such symptoms occur or there is failure to improve as anticipated.  -----------------------------------------------------------------------------    PAIN LEVEL AT CONCLUSION OF PROCEDURE:  8/10

## 2016-01-10 NOTE — Patient Instructions (Addendum)
Post Injection Instructions    If you develop any abnormal symptoms, such as itching, swelling, redness, rash, or shortness of breath, please call our office. Normally, these are temporary symptoms, which resolve within several hours to a day, but our office is more than happy to answer any questions you may have.    The provider would like you to observe the injection area for redness, swelling, or increased heat. If any or all of these reactions occur, please call our office as soon as possible. This reaction may indicate the first signs of infection. Although very rare, it is  best if caught early.    I have reviewed these instructions and the patient verbalizes understanding.  Current health maintenance issues were reviewed and the patient was advised to followup with his/her PCP for completion of these items.

## 2016-01-17 ENCOUNTER — Ambulatory Visit
Admit: 2016-01-17 | Discharge: 2016-01-17 | Payer: PRIVATE HEALTH INSURANCE | Attending: Urology | Primary: Family Medicine

## 2016-01-17 DIAGNOSIS — IMO0002 Reserved for concepts with insufficient information to code with codable children: Secondary | ICD-10-CM

## 2016-01-17 LAB — AMB POC URINALYSIS DIP STICK AUTO W/O MICRO
Bilirubin (UA POC): NEGATIVE
Glucose (UA POC): NEGATIVE
Ketones (UA POC): NEGATIVE
Leukocyte esterase (UA POC): NEGATIVE
Nitrites (UA POC): NEGATIVE
Protein (UA POC): NEGATIVE mg/dL
Specific gravity (UA POC): 1.02 (ref 1.001–1.035)
Urobilinogen (UA POC): 0.2 (ref 0.2–1)
pH (UA POC): 5.5 (ref 4.6–8.0)

## 2016-01-17 NOTE — Progress Notes (Signed)
[name]  [birth year]          ICD-10-CM ICD-9-CM    1. Cystocele, unspecified cystocele location N81.10 618.01 AMB POC URINALYSIS DIP STICK AUTO W/O MICRO   2. Vaginal vault prolapse, posthysterectomy N99.3 618.5    3. Urinary urgency R39.15 788.63    4. Dysuria R30.0 788.1        Assessment and Plan:  UA today +1 blood  PVR today 55cc    1. Grade 3 rectocele and Grade 1 cystocele / S/p hysterectomy .       Discussed surgical intervention options of no intervention, use of pessary, more traditional repair of cystocele, enterocele or rectocele with autologous fascia as support, the use of mesh for   support,colpocleisis or the minimally invasive robotic assisted laparoscopic sacrocolpopexy. ??   Patient elects observation at this time. Sx's not too bothersome.   Will proceed with management when patient desires.     2. Urgency / Frequency   Discussed OAB pathway.   Avoid bladder irritants such as caffeine and citrus.   Limit fluids at least 2 hours before bed.      3. PFD / Pelvic pain   Discussed etiologies of PFD.   Left OI and left LA tenderness on exam today.    Left SI joint tenderness on exam today.   Not bothersome at this time. Consider sending for Pelvic floor PT.    RTO prn, sooner if sx's worsen.     Discussion:     Discussed OAB in terms of pathophysiology, diagnosis, and treatment. We discussed the AUA guidelines for evaluation and treatment of OAB, with focus on treatment with first, second and third line therapy.     Initial therapies are conservative, consisting of a combination of behavioral management, fluid management, and sometimes PFPT. We discussed limiting fluids after dinner and avoiding caffeine and other bladder irritants. Additionally we discussed elevating legs one hour before bed and good voiding habits. Bladder retraining was discussed.     Second line therapy consists of medication. Options for medication, drug  classes, risks and benefits were discussed in detail. We discussed the list of medications, along with possible side effects of dry mouth, dry eyes, constipation, and blood pressure elevation.     If first and second line therapy are ineffective, third line therapy, generally consisting of procedures is considered. Prior to initiation of third line therapy, further evaluation is warranted. Cystoscopy and UDS may be an important part of this evaluation. Consideration will then be given to use of intravesical Botox, PTNS, or Interstim, which were discussed today.    Handout was provided, and all questions were answered to patient's satisfaction.       Chief Complaint   Patient presents with   ??? Cystocele       History of Present Illness:  Cassandra Daniels is a 55 y.o. female G3P3 who presents today in consultation for a cystocele and has been referred by Georgianne Fick, MD. Patient reports sx's started 1 month ago. S/p post menopausal posthysterectomy at age 32, no mesh felt on exam today.     Today the patient is doing well.   Notes that she is able to feel the prolapse with sitting.   Notes urinary frequency and urgency.   Some dyspareunia.   Denies pelvic pain.  Denies vaginal discharge.     Urinary sx's:  Notes urinary urgency and frequency.   Good FOS  Denies straining to void.   Denies sensations of  incomplete bladder emptying.   Denies dysuria, gross hematuria.   Asymptomatic for UTI with no significant h/o UTI's.     Denies chronic constipation.  Normal BM's    Denies neurological issues.   Denies pelvic or back issues.   Denies prior kidney or bladder surgeries.  Denies prior history of nephrolithiasis.    S/p cholecystectomy, gastric bypass.     Past Medical History:   Diagnosis Date   ??? Arthritis    ??? Atherosclerotic cardiovascular disease    ??? Atrial fibrillation ([place])    ??? Bilateral knee pain    ??? Broken nose 1999    surgery   ??? Cardiac echocardiogram 10/17/2014     EF 60-65%.  No WMA.  Mild LVH.  Gr 1 DDfx.  No MVP.  Similar to study of 05/24/10.   ??? Cardiac Holter monitor study 03/24/2012    Sinus rhythm, avg HR 95 bpm.  Rare PVCs.  No sustained arrhythmias, heart block, or pauses.   ??? Cardiac nuclear imaging test, low risk 07/07/2012    Low risk.  No ischemia or prior infarction.  EF >80%.  No WMA.  Neg EKG on pharm stress test.   ??? Carpal tunnel syndrome    ??? Chronic fatigue    ??? Essential hypertension    ??? Fibrocystic breast    ??? HTN    ??? Hypothyroidism    ??? Left groin pain    ??? Left hip pain    ??? Meningioma nos    ??? Mitral (valve) prolapse    ??? MVP (mitral valve prolapse)    ??? Pancreatitis 2014   ??? Paroxysmal atrial tachycardia ([place])    ??? PVC (premature ventricular contraction)    ??? Right hip pain    ??? Right shoulder pain    ??? SVT (supraventricular tachycardia) ([place])    ??? Unspecified adverse effect of anesthesia     difficulty waking up    ??? Unspecified sleep apnea     uses cpap machine   ??? Vitamin B 12 deficiency        Past Surgical History:   Procedure Laterality Date   ??? ABLATION OF SVT  2007    by Dr. Serita Sheller   ??? HX BLADDER REPAIR  1996   ??? HX CHOLECYSTECTOMY  2014   ??? HX GASTRIC BYPASS  11/07   ??? HX HYSTERECTOMY     ??? HX KNEE ARTHROSCOPY     ??? HX RHINOPLASTY     ??? HX SHOULDER ARTHROSCOPY     ??? MAM BIOPSY BREAST STEREOTACTIC  april, 2013    right, benign       Social History   Substance Use Topics   ??? Smoking status: Never Smoker   ??? Smokeless tobacco: Never Used   ??? Alcohol use No       Allergies   Allergen Reactions   ??? Beta Blocker [Beta-Blockers (Beta-Adrenergic Blocking Agts)] Other (comments)     Fatigue   ??? Tetracycline Hives       Family History   Problem Relation Age of Onset   ??? Asthma Mother    ??? Breast Cancer Mother 33   ??? Cancer Mother    ??? Other Father      cabg   ??? Heart Disease Father    ??? High Cholesterol Father    ??? Heart Attack Maternal Grandmother 59   ??? Breast Cancer Maternal Grandmother 20       Current Outpatient Prescriptions  Medication Sig Dispense Refill   ??? [START ON 01/30/2016] HYDROcodone-acetaminophen (NORCO) 7.5-325 mg per tablet Take 1 Tab by mouth two (2) times daily as needed for Pain for up to 30 days. Max Daily Amount: 2 Tabs. Indications: Pain 40 Tab 0   ??? zolpidem CR (AMBIEN CR) 12.5 mg tablet Take 1 Tab by mouth nightly as needed for Sleep for up to 30 days. Max Daily Amount: 12.5 mg. Indications: INSOMNIA 30 Tab 5   ??? clonazePAM (KLONOPIN) 0.5 mg tablet Take 1-2 Tabs by mouth nightly as needed for up to 30 days. Max Daily Amount: 1 mg. 60 Tab 5   ??? cyclobenzaprine (FLEXERIL) 10 mg tablet Take 1 Tab by mouth three (3) times daily as needed for Muscle Spasm(s) for up to 30 days. Indications: MUSCLE SPASM 90 Tab 5   ??? zolpidem CR (AMBIEN CR) 6.25 mg tablet Take 6.25 mg by mouth nightly as needed for Sleep.     ??? primidone (MYSOLINE) 50 mg tablet TAKE 1 TABLET BY MOUTH EVERY MORNING AND 2 TABLETS BY MOUTH EVERY NIGHT AT BEDTIME (Patient taking differently: 50 mg two (2) times a day.) 270 Tab 3   ??? hyoscyamine SL (LEVSIN/SL) 0.125 mg SL tablet Take 1 Tab by mouth every six (6) hours as needed for Cramping. 30 Tab 3   ??? CYANOCOBALAMIN, VITAMIN B-12, (VITAMIN B-12 PO) Take 1 Tab by mouth daily.     ??? traMADol (ULTRAM) 50 mg tablet Take 1 Tab by mouth every eight (8) hours as needed for Pain for up to 30 days. Max Daily Amount: 150 mg. Indications: NEUROPATHIC PAIN, Pain 60 Tab 5   ??? diclofenac (VOLTAREN) 1 % gel Apply  to affected area four (4) times daily.     ??? ferrous sulfate (IRON) 325 mg (65 mg iron) tablet Take 325 mg by mouth Daily (before breakfast).             Review of Systems  Constitutional: Fever: No  Skin: Rash: No  HEENT: Hearing difficulty: No  Eyes: Blurred vision: No  Cardiovascular: Chest pain: No  Respiratory: Shortness of breath: No  Gastrointestinal: Nausea/vomiting: No  Musculoskeletal: Back pain: No  Neurological: Weakness: No  Psychological: Memory loss:   Comments/additional findings:            PHYSICAL EXAMINATION:     Visit Vitals   ??? BP 122/60   ??? Ht 5' 2.5" (1.588 m)   ??? Wt 179 lb (81.2 kg)   ??? BMI 32.22 kg/m2     Constitutional: Well developed, well-nourished female in no acute distress.   CV:  No peripheral swelling noted  Respiratory: No respiratory distress or difficulties  Abdomen:  Soft and nontender. No masses. No hepatosplenomegaly, No CVA tenderness, no Suprapubic tenderness. Well healed abdominoplasty and laproscopic incisions.   GU Female:    Urethral lesions: NO  Pelvic muscles tenderness: YES.   OI muscles:Yes: Comment: left    LA muscles:Yes: Comment: left  Kegels strength: normal  Prior Hysterectomy: YES  Vagina: no mesh felt  Pelvic organ prolapse: YES   Cystocele:YES Grade 1   Rectocele:YES Grade 3  Skin:  Normal color. No evidence of jaundice.     Neuro/Psych:  Patient with appropriate affect.  Alert and oriented.    Lymphatic:   No enlargement of supraclavicular lymph nodes.  LE: No edema.  Musculoskeletal: PS muscle tenderness: NO  SI joint tenderness: YES Left            Pubic symphysis tenderness: NO          Adductor muscle tenderness: NO      Catheterized PVR:55cc  Using aseptic technique, PVR obtained using 40fr catheter.     REVIEW OF LABS AND IMAGING:      Results for orders placed or performed in visit on 01/17/16   AMB POC URINALYSIS DIP STICK AUTO W/O MICRO   Result Value Ref Range    Color (UA POC) Yellow     Clarity (UA POC) Clear     Glucose (UA POC) Negative Negative    Bilirubin (UA POC) Negative Negative    Ketones (UA POC) Negative Negative    Specific gravity (UA POC) 1.020 1.001 - 1.035    Blood (UA POC) 1+ Negative    pH (UA POC) 5.5 4.6 - 8.0    Protein (UA POC) Negative Negative mg/dL    Urobilinogen (UA POC) 0.2 mg/dL 0.2 - 1    Nitrites (UA POC) Negative Negative    Leukocyte esterase (UA POC) Negative Negative         A copy of today's office visit with all pertinent imaging results and labs  were sent to the referring physician,JEANNINE York Cerise, MD        Elvin So, MD on 01/17/2016     Medical Documentation is provided with the assistance of Perla, Medical Scribe for Elvin So, MD on 01/17/2016

## 2016-02-01 ENCOUNTER — Encounter: Attending: Urology | Primary: Family Medicine

## 2016-02-09 ENCOUNTER — Encounter

## 2016-02-11 NOTE — Telephone Encounter (Addendum)
fyi--This is a new pt for dr caines scheduled for 02/20/16 for lump on her foot.  pt recently seen for knee issues by dr Luther Redo..the patient reports lump on her foot traveling to her ankle   pt is on vacation in [place]. Advised pt to go to e/r in [place] due to her level of pain and traveling lump but pt declined.  if necessary pt can be reached to discuss condition at, [phone]

## 2016-02-11 NOTE — Telephone Encounter (Signed)
[name] was also made aware of this message

## 2016-02-20 ENCOUNTER — Encounter: Attending: Foot and Ankle Surgery | Primary: Family Medicine

## 2016-04-28 NOTE — Telephone Encounter (Signed)
Pt called in wanting to set up surgery. Inform pt that she may need to come into the office to see physician being that it has been since June and according to last office note, pt was to f/u PRN. Inform pt that I will send a msg to physician and once he reply, we will get back with her. Pt stated she wanted to have sx done poss around thanksgiving. Route msg to Dr. Jolaine Artist

## 2016-04-29 NOTE — Telephone Encounter (Signed)
lvm for pt to return my call about scheduling appt to come into the office per Dr. Jolaine Artist

## 2016-04-30 NOTE — Telephone Encounter (Signed)
Pt return my call in regards to appt. Inform her that physician would like to see her. Pt is schedule for Oct 17

## 2016-05-08 ENCOUNTER — Encounter: Attending: Orthopaedic Surgery | Primary: Family Medicine

## 2016-05-13 ENCOUNTER — Ambulatory Visit
Admit: 2016-05-13 | Discharge: 2016-05-13 | Payer: PRIVATE HEALTH INSURANCE | Attending: Physical Medicine & Rehabilitation | Primary: Family Medicine

## 2016-05-13 ENCOUNTER — Encounter: Attending: Physical Medicine & Rehabilitation | Primary: Family Medicine

## 2016-05-13 ENCOUNTER — Encounter: Attending: Pain Medicine | Primary: Family Medicine

## 2016-05-13 DIAGNOSIS — M79641 Pain in right hand: Secondary | ICD-10-CM

## 2016-05-13 LAB — AMB POC DRUG SCREEN (G0477)
AMPHETAMINES UR POC: NEGATIVE
BARBITURATES UR POC: NEGATIVE
BENZODIAZEPINES UR POC: NEGATIVE
CANNABINOIDS UR POC: NEGATIVE
COCAINE UR POC: NEGATIVE
MDMA/ECSTASY UR POC: NEGATIVE
METHADONE UR POC: NEGATIVE
METHAMPHETAMINE UR POC: NEGATIVE
METHYLPHENIDATE UR POC: NEGATIVE
OPIATES UR POC: NEGATIVE
OXYCODONE UR POC: NEGATIVE
TRICYCLICS UR POC: NEGATIVE

## 2016-05-13 MED ORDER — ZOLPIDEM SR 12.5 MG MULTIPHASE TAB
12.5 mg | ORAL_TABLET | Freq: Every evening | ORAL | 2 refills | Status: AC | PRN
Start: 2016-05-13 — End: 2016-06-12

## 2016-05-13 MED ORDER — TRAMADOL 50 MG TAB
50 mg | ORAL_TABLET | ORAL | 2 refills | Status: DC
Start: 2016-05-13 — End: 2016-07-08

## 2016-05-13 MED ORDER — HYDROCODONE-ACETAMINOPHEN 7.5 MG-325 MG TAB
ORAL_TABLET | Freq: Two times a day (BID) | ORAL | 0 refills | Status: DC | PRN
Start: 2016-05-13 — End: 2016-08-18

## 2016-05-13 MED ORDER — HYDROCODONE-ACETAMINOPHEN 7.5 MG-325 MG TAB
ORAL_TABLET | Freq: Two times a day (BID) | ORAL | 0 refills | Status: DC | PRN
Start: 2016-05-13 — End: 2016-05-13

## 2016-05-13 NOTE — Progress Notes (Signed)
Nursing Notes    Patient presents to the office today in follow-up.   Patient rates her pain at 6/10 on the numerical pain scale.     Reviewed medications with counts as follows:    Rx Date filled Qty Dispensed Pill Count Last Dose Short     Clonazepam 0.5mg  01/01/16 60 10 Two weeks ago  No                                       Comments:           POC UDS was performed in office today    Any new labs or imaging since last appointment? NO    Have you been to an emergency room (ER) or urgent care clinic since your last visit?  NO            Have you been hospitalized since your last visit? NO     If yes, where, when, and reason for visit?     Have you seen or consulted any other health care providers outside of the [facility]  since your last visit?  YES     If yes, where, when, and reason for visit? Urology       HM deferred to pcp.       Patient did not bring empty medication bottle(s) tramadol 50mg  , norco 7.5/325mg  , and ambien for this office visit; advised to bring in all medication bottles, empty or otherwise, for all office visits per [facility] policy.

## 2016-05-13 NOTE — Progress Notes (Signed)
HISTORY OF PRESENT ILLNESS  Cassandra Daniels is a 55 y.o. female.  HPI Comments: Visit survey reviewed  The patient is usually seen by Dr. Justice Rocher and his physician assistants.  I have reviewed progress notes and discussed medication and pain with the patient today.  Chief complaint??? right hand pain, right hip and knee pain.  She has been using tramadol as needed and hydrocodone 7.5 mg twice a day as needed.  I have continued those prescriptions.  She has also been using controlled release Ambien 6.25 milligram in the evening as needed for sleep.  She can continue with this medicine.  She did inform me that the primidone prescriptions can come from her primary care physician.  I believe that Dr. Justice Rocher was prescribing clonazepam for her in the past.  She states this helps with???agitation.  She uses this infrequently and has some remaining.  I informed the patient that moving forward, our clinic would no longer prescribe this for her.  I also reminded the patient that she should return to her orthopedic clinic for future injections.  The patient spoke in detail with nursing and administration today.  I also had a lengthy conversation with the patient and answered all her questions to the best of my ability.  She has been informed that her usual healthcare providers from our clinic are no longer with this clinic.  There are changes, ongoing in the world of pain management and also changes within our own clinic.  We can continue to see the patient in the future if she likes.    Hand Pain    Associated symptoms include back pain and neck pain. Pertinent negatives include no itching.   Knee Pain     Hip Pain    Associated symptoms include back pain and neck pain. Pertinent negatives include no itching.       Review of Systems   Constitutional: Positive for malaise/fatigue.   HENT: Negative for ear discharge and ear pain.    Eyes: Negative for pain and discharge.    Respiratory: Negative for hemoptysis and wheezing.    Cardiovascular: Negative for palpitations and PND.   Gastrointestinal: Positive for constipation, heartburn and nausea. Negative for blood in stool.   Genitourinary: Negative for dysuria and hematuria.   Musculoskeletal: Positive for back pain, joint pain (polyarticular), myalgias and neck pain.   Skin: Negative for itching and rash.   Neurological: Positive for weakness (generalized). Negative for seizures and loss of consciousness.   Psychiatric/Behavioral: Negative for hallucinations and suicidal ideas. The patient has insomnia.    All other systems reviewed and are negative.      Physical Exam   Constitutional: She appears well-developed and well-nourished. She is cooperative. She does not have a sickly appearance.   HENT:   Head: Normocephalic and atraumatic.   Right Ear: External ear normal. No drainage.   Left Ear: External ear normal. No drainage.   Nose: Nose normal.   Eyes: Lids are normal. Right eye exhibits no discharge. Left eye exhibits no discharge. Right conjunctiva has no hemorrhage. Left conjunctiva has no hemorrhage.   Neck: Neck supple. No tracheal deviation present. No thyroid mass present.   Pulmonary/Chest: Effort normal. No respiratory distress.   Neurological: She is alert. No cranial nerve deficit.   Skin: Skin is intact. No rash noted.   Psychiatric: Her speech is normal. Her affect is not angry. She does not express inappropriate judgment.   Nursing note and vitals reviewed.  ASSESSMENT and PLAN  Encounter Diagnoses   Name Primary?   ??? Right hand pain Yes   ??? Encounter for long-term (current) use of high-risk medication    ??? Right hip pain    ??? Chronic pain of right knee    No indicators for recent medication misuse.  PMP reviewed.    Pain Meds and Quality Of Life have been reviewed.Nonpharmacologic therapy and non-opioid pharmacologic therapy were considered.   If opioid therapy is prescribed, this is only if the expected benefits are anticipated to outweigh risks.  Possible changes to treatment plan considered.Support/education given as needed.    Today-medications are as listed.   --Urine test or oral swab today.Also, the prescription monitoring program was reviewed today.The majority of today's visit was spent counseling and coordinating care. Total visit time-40 minutes.  -Dragon medical dictation software was used for portions of this report. Unintended errors may occur.  Follow up -- 3 months.  -She has indicated that she might try to be seen by a different pain clinic.  I discussed this with the patient today and she was given a list of other pain clinics in the area.  At this time, I recommend a follow-up with our clinic and if she does schedule with a different clinic, she can call in and we will cancel that appointment.  I also told the patient we can certainly send records wherever she requests.

## 2016-05-27 ENCOUNTER — Ambulatory Visit
Admit: 2016-05-27 | Discharge: 2016-05-27 | Payer: PRIVATE HEALTH INSURANCE | Attending: Urology | Primary: Family Medicine

## 2016-05-27 DIAGNOSIS — N816 Rectocele: Secondary | ICD-10-CM

## 2016-05-27 LAB — AMB POC URINALYSIS DIP STICK AUTO W/O MICRO
Bilirubin (UA POC): NEGATIVE
Glucose (UA POC): NEGATIVE
Ketones (UA POC): NEGATIVE
Leukocyte esterase (UA POC): NEGATIVE
Nitrites (UA POC): NEGATIVE
Protein (UA POC): NEGATIVE mg/dL
Specific gravity (UA POC): 1.01 (ref 1.001–1.035)
Urobilinogen (UA POC): 0.2 (ref 0.2–1)
pH (UA POC): 5.5 (ref 4.6–8.0)

## 2016-05-27 MED ORDER — GUAIFENESIN 100 MG/5 ML ORAL LIQUID
100 mg/5 mL | Freq: Three times a day (TID) | ORAL | 0 refills | Status: DC | PRN
Start: 2016-05-27 — End: 2016-07-08

## 2016-05-27 NOTE — Progress Notes (Signed)
Cassandra Daniels  02/06/1961          ICD-10-CM ICD-9-CM    1. Pelvic organ prolapse quantification stage 3 rectocele N81.6 618.04 AMB POC URINALYSIS DIP STICK AUTO W/O MICRO   2. Urinary urgency R39.15 788.63    3. Urinary frequency R35.0 788.41    4. PFD (pelvic floor dysfunction) M62.89 618.83    5. Cough R05 786.2        Assessment and Plan:  UA today 2+ blood     1. Grade 3 rectocele and Grade 1 cystocele / S/p hysterectomy with "bladder tack" at age 55.    Found on exam 01/17/2016   Discussed surgical intervention options of no intervention, use of pessary, more traditional repair of cystocele, enterocele or rectocele with autologous   fascia as support, the use of mesh for support,colpocleisis or the minimally invasive robotic assisted laparoscopic sacrocolpopexy.    Patient elects repair.   She will call office when she is ready to schedule surgery.    Will proceed with Vaginal Rectocele repair possible Perineorrhaphy at that time.      2. Urgency / Frequency  Likely exacerbated with #1.               Avoid bladder irritants such as caffeine and citrus.              Limit fluids at least 2 hours before bed.                           3. PFD / Pelvic pain              Left OI, left LA, and left SI joint tenderness on exam 01/17/2016              Not bothersome at this time. Will consider sending for Pelvic floor PT if sx's worsen.    3. Cough   Requesting medication.    Rx for Tussin given.  R/B's and SE's discussed.      RTO postoperatively.     Patient's BMI is out of the normal parameters.  Information about BMI was given to the patient.      Discussion:     Discussed OAB in terms of pathophysiology, diagnosis, and treatment. We discussed the AUA guidelines for evaluation and treatment of OAB, with focus on treatment with first, second and third line therapy.     Initial therapies are conservative, consisting of a combination of behavioral management, fluid management, and sometimes PFPT. We discussed  limiting fluids after dinner and avoiding caffeine and other bladder irritants. Additionally we discussed elevating legs one hour before bed and good voiding habits. Bladder retraining was discussed.     Second line therapy consists of medication. Options for medication, drug classes, risks and benefits were discussed in detail. We discussed the list of medications, along with possible side effects of dry mouth, dry eyes, constipation, and blood pressure elevation.     If first and second line therapy are ineffective, third line therapy, generally consisting of procedures is considered. Prior to initiation of third line therapy, further evaluation is warranted. Cystoscopy and UDS may be an important part of this evaluation. Consideration will then be given to use of intravesical Botox, PTNS, or Interstim, which were discussed today.    Handout was provided, and all questions were answered to patient's satisfaction.       Chief Complaint   Patient presents with   ???  Cystocele       History of Present Illness:  Cassandra Daniels is a 55 y.o. female G3P3 presents today to further discuss management options for her known bladder prolapse. She is also with urgency, frequency and PFD.     Today the patient is doing well.   Still able to feel the prolapse. Increasing in severity, as if it is about to "fall out".  Reports sensations of stool "stuck in the bulge"   Maneuvers body to release bowels.   Some dyspareunia.   Denies pelvic pain.  Denies vaginal discharge.  Denies vaginal tearing with intercourse.     Urinary sx's:  +Urinary urgency and frequency.   Good FOS  Denies straining to void.   Denies sensations of incomplete bladder emptying.   Denies dysuria, gross hematuria.   Asymptomatic for UTI with no significant h/o UTI's.     Episodes of fecal urgency.   Denies chronic constipation.  Normal BM's    Denies neurological issues.   Denies pelvic or back issues.   Denies prior kidney or bladder surgeries.   Denies prior history of nephrolithiasis.    S/p post menopausal posthysterectomy at age 42  S/p cholecystectomy & gastric bypass.     Past Medical History:   Diagnosis Date   ??? Arthritis    ??? Atherosclerotic cardiovascular disease    ??? Atrial fibrillation ([place])    ??? Bilateral knee pain    ??? Broken nose 1999    surgery   ??? Cardiac echocardiogram 10/17/2014    EF 60-65%.  No WMA.  Mild LVH.  Gr 1 DDfx.  No MVP.  Similar to study of 05/24/10.   ??? Cardiac Holter monitor study 03/24/2012    Sinus rhythm, avg HR 95 bpm.  Rare PVCs.  No sustained arrhythmias, heart block, or pauses.   ??? Cardiac nuclear imaging test, low risk 07/07/2012    Low risk.  No ischemia or prior infarction.  EF >80%.  No WMA.  Neg EKG on pharm stress test.   ??? Carpal tunnel syndrome    ??? Chronic fatigue    ??? Essential hypertension    ??? Fibrocystic breast    ??? HTN    ??? Hypothyroidism    ??? Left groin pain    ??? Left hip pain    ??? Meningioma nos    ??? Mitral (valve) prolapse    ??? MVP (mitral valve prolapse)    ??? Pancreatitis 2014   ??? Paroxysmal atrial tachycardia ([place])    ??? PVC (premature ventricular contraction)    ??? Right hip pain    ??? Right shoulder pain    ??? SVT (supraventricular tachycardia) ([place])    ??? Unspecified adverse effect of anesthesia     difficulty waking up    ??? Unspecified sleep apnea     uses cpap machine   ??? Vitamin B 12 deficiency        Past Surgical History:   Procedure Laterality Date   ??? ABLATION OF SVT  2007    by Dr. Serita Sheller   ??? HX BLADDER REPAIR  1996   ??? HX CHOLECYSTECTOMY  2014   ??? HX GASTRIC BYPASS  11/07   ??? HX HYSTERECTOMY     ??? HX KNEE ARTHROSCOPY     ??? HX RHINOPLASTY     ??? HX SHOULDER ARTHROSCOPY     ??? MAM BIOPSY BREAST STEREOTACTIC  april, 2013    right, benign       Social  History   Substance Use Topics   ??? Smoking status: Never Smoker   ??? Smokeless tobacco: Never Used   ??? Alcohol use No       Allergies   Allergen Reactions   ??? Beta Blocker [Beta-Blockers (Beta-Adrenergic Blocking Agts)] Other (comments)     Fatigue    ??? Tetracycline Hives       Family History   Problem Relation Age of Onset   ??? Asthma Mother    ??? Breast Cancer Mother 33   ??? Cancer Mother    ??? Other Father      cabg   ??? Heart Disease Father    ??? High Cholesterol Father    ??? Heart Attack Maternal Grandmother 48   ??? Breast Cancer Maternal Grandmother 9       Current Outpatient Prescriptions   Medication Sig Dispense Refill   ??? guaiFENesin (TUSSIN EXPECTORANT) 100 mg/5 mL liquid Take 10 mL by mouth three (3) times daily as needed for Cough. 100 mL 0   ??? levoFLOXacin (LEVAQUIN) 750 mg tablet TK 1 T PO D FOR 10 DAYS  0   ??? VIRTUSSIN AC 10-100 mg/5 mL solution TK 1 TEASPOON PO Q 6 H PRN FOR COUGH  0   ??? cyclobenzaprine (FLEXERIL) 10 mg tablet Take  by mouth three (3) times daily as needed for Muscle Spasm(s).     ??? traMADol (ULTRAM) 50 mg tablet 1-2  Every day  prn  Indications: NEUROPATHIC PAIN, Pain 60 Tab 2   ??? zolpidem CR (AMBIEN CR) 12.5 mg tablet Take 1 Tab by mouth nightly as needed for Sleep for up to 30 days. Max Daily Amount: 12.5 mg. Indications: INSOMNIA 30 Tab 2   ??? [START ON 07/11/2016] HYDROcodone-acetaminophen (NORCO) 7.5-325 mg per tablet Take 1 Tab by mouth two (2) times daily as needed for Pain for up to 30 days. Max Daily Amount: 2 Tabs. Indications: Pain 60 Tab 0   ??? diclofenac (VOLTAREN) 1 % gel Apply  to affected area four (4) times daily.     ??? zolpidem CR (AMBIEN CR) 6.25 mg tablet Take 6.25 mg by mouth nightly as needed for Sleep.     ??? ferrous sulfate (IRON) 325 mg (65 mg iron) tablet Take 325 mg by mouth Daily (before breakfast).     ??? primidone (MYSOLINE) 50 mg tablet TAKE 1 TABLET BY MOUTH EVERY MORNING AND 2 TABLETS BY MOUTH EVERY NIGHT AT BEDTIME (Patient taking differently: 50 mg two (2) times a day.) 270 Tab 3   ??? hyoscyamine SL (LEVSIN/SL) 0.125 mg SL tablet Take 1 Tab by mouth every six (6) hours as needed for Cramping. 30 Tab 3   ??? CYANOCOBALAMIN, VITAMIN B-12, (VITAMIN B-12 PO) Take 1 Tab by mouth daily.              Review of Systems  Constitutional: Fever: No  Skin: Rash: No  HEENT: Hearing difficulty: No  Eyes: Blurred vision: No  Cardiovascular: Chest pain: No  Respiratory: Shortness of breath: No  Gastrointestinal: Nausea/vomiting: No  Musculoskeletal: Back pain: Yes  Neurological: Weakness: No  Psychological: Memory loss: No  Comments/additional findings:           PHYSICAL EXAMINATION:     Visit Vitals   ??? BP 116/82   ??? Ht 5' 2.5" (1.588 m)   ??? Wt 179 lb (81.2 kg)   ??? BMI 32.22 kg/m2     Constitutional: Well developed, well-nourished female in no acute distress.  CV:  No peripheral swelling noted  Respiratory: No respiratory distress or difficulties  Abdomen:  Soft and nontender. No masses. No hepatosplenomegaly, No CVA tenderness, no Suprapubic tenderness. Well healed abdominoplasty and laproscopic incisions.   Skin:  Normal color. No evidence of jaundice.     Neuro/Psych:  Patient with appropriate affect.  Alert and oriented.        REVIEW OF LABS AND IMAGING:      Results for orders placed or performed in visit on 05/27/16   AMB POC URINALYSIS DIP STICK AUTO W/O MICRO   Result Value Ref Range    Color (UA POC) Yellow     Clarity (UA POC) Clear     Glucose (UA POC) Negative Negative    Bilirubin (UA POC) Negative Negative    Ketones (UA POC) Negative Negative    Specific gravity (UA POC) 1.010 1.001 - 1.035    Blood (UA POC) 2+ Negative    pH (UA POC) 5.5 4.6 - 8.0    Protein (UA POC) Negative Negative mg/dL    Urobilinogen (UA POC) 0.2 mg/dL 0.2 - 1    Nitrites (UA POC) Negative Negative    Leukocyte esterase (UA POC) Negative Negative         A copy of today's office visit with all pertinent imaging results and labs were sent to the referring physician,JEANNINE York Cerise, MD        Elvin So, MD on 06/01/2016     Medical Documentation is provided with the assistance of [place], Medical Scribe for Elvin So, MD on 06/01/2016

## 2016-06-09 NOTE — Telephone Encounter (Signed)
Pt called in ready to schedule surgery. Pt is on the schedule for Nov 22. Pt had some additional questions regarding surgery that I was not able to answer. Inform pt that I will send a msg to the physician to give her a call. Route msg to md.

## 2016-06-09 NOTE — Telephone Encounter (Signed)
Called pt, answered all questions regarding surgery

## 2016-06-10 ENCOUNTER — Ambulatory Visit
Admit: 2016-06-10 | Discharge: 2016-06-10 | Payer: PRIVATE HEALTH INSURANCE | Attending: Orthopaedic Surgery | Primary: Family Medicine

## 2016-06-10 DIAGNOSIS — M1811 Unilateral primary osteoarthritis of first carpometacarpal joint, right hand: Secondary | ICD-10-CM

## 2016-06-10 NOTE — Patient Instructions (Signed)
Arthritis: Care Instructions  Your Care Instructions  Arthritis, also called osteoarthritis, is a breakdown of the cartilage that cushions your joints. When the cartilage wears down, your bones rub against each other. This causes pain and stiffness. Many people have some arthritis as they age. Arthritis most often affects the joints of the spine, hands, hips, knees, or feet.  You can take simple measures to protect your joints, ease your pain, and help you stay active.  Follow-up care is a key part of your treatment and safety. Be sure to make and go to all appointments, and call your doctor if you are having problems. It's also a good idea to know your test results and keep a list of the medicines you take.  How can you care for yourself at home?  ?? Stay at a healthy weight. Being overweight puts extra strain on your joints.  ?? Talk to your doctor or physical therapist about exercises that will help ease joint pain.  ?? Stretch. You may enjoy gentle forms of yoga to help keep your joints and muscles flexible.  ?? Walk instead of jog. Other types of exercise that are less stressful on the joints include riding a bicycle, swimming, tai chi, or water exercise.  ?? Lift weights. Strong muscles help reduce stress on your joints. Stronger thigh muscles, for example, take some of the stress off of the knees and hips. Learn the right way to lift weights so you do not make joint pain worse.  ?? Take your medicines exactly as prescribed. Call your doctor if you think you are having a problem with your medicine.  ?? Take pain medicines exactly as directed.  ?? If the doctor gave you a prescription medicine for pain, take it as prescribed.  ?? If you are not taking a prescription pain medicine, ask your doctor if you can take an over-the-counter medicine.  ?? Use a cane, crutch, walker, or another device if you need help to get around. These can help rest your joints. You also can use other things to  make life easier, such as a higher toilet seat and padded handles on kitchen utensils.  ?? Do not sit in low chairs, which can make it hard to get up.  ?? Put heat or cold on your sore joints as needed. Use whichever helps you most. You also can take turns with hot and cold packs.  ?? Apply heat 2 or 3 times a day for 20 to 30 minutes-using a heating pad, hot shower, or hot pack-to relieve pain and stiffness.  ?? Put ice or a cold pack on your sore joint for 10 to 20 minutes at a time. Put a thin cloth between the ice and your skin.  When should you call for help?  Call your doctor now or seek immediate medical care if:  ? ?? You have sudden swelling, warmth, or pain in any joint.   ? ?? You have joint pain and a fever or rash.   ? ?? You have such bad pain that you cannot use a joint.   ?Watch closely for changes in your health, and be sure to contact your doctor if:  ? ?? You have mild joint symptoms that continue even with more than 6 weeks of care at home.   ? ?? You have stomach pain or other problems with your medicine.   Where can you learn more?  Go to [email or web address].  Enter [phone] in the search box to learn more  about "Arthritis: Care Instructions."  Current as of: June 11, 2015  Content Version: 11.4  ?? 2006-2017 [employer]. Care instructions adapted under license by [employer] (which disclaims liability or warranty for this information). If you have questions about a medical condition or this instruction, always ask your healthcare professional. [place] any warranty or liability for your use of this information.

## 2016-06-10 NOTE — Progress Notes (Signed)
Cassandra Daniels [facility]  [birth year]   Chief Complaint   Patient presents with   ??? Hand Pain     Right        HISTORY OF PRESENT ILLNESS  Cassandra Daniels is a 55 y.o. female who presents today for reevaluation of right hand pain. She rates her pain 5/10 today. She states she has had hand pain for about 10 years. She had increased pain with gripping objects, stirring. She reports she has received cortisone injections in the hand every 4 months for years. States she has had previous EMGs done that were negative for carpal tunnel. Patient denies any fever, chills, chest pain, shortness of breath or calf pain. There are no new illness or injuries to report since last seen in the office. There are no changes to medications, allergies, family or social history.     PHYSICAL EXAM:   Visit Vitals   ??? BP 124/79 (BP 1 Location: Left arm, BP Patient Position: Sitting)   ??? Pulse 92   ??? Resp 18   ??? Ht 5' 2.5" (1.588 m)   ??? SpO2 93%     The patient is a well-developed, well-nourished female   in no acute distress.  The patient is alert and oriented times three.  The patient is alert and oriented times three. Mood and affect are normal.  LYMPHATIC: lymph nodes are not enlarged and are within normal limits  SKIN: normal in color and non tender to palpation. There are no bruises or abrasions noted.   NEUROLOGICAL: Motor sensory exam is within normal limits. Reflexes are equal bilaterally. There is normal sensation to pinprick and light touch  MUSCULOSKELETAL:  Examination Right Hand   Skin Intact   Deformity -   Swelling -   Tenderness cmc   Tenderness A1 Pulley -   Finger flexion Full   Finger extension Full   Thenar Eminence Atrophy -   Sensation Normal   Capillary refill -   Heberden's nodes -   Dupuytren's -     Examination Right Wrist   Skin Intact   Tenderness -   Flexion 60   Extension 60   Deformity -   Effusion -   Tinnel's sign -   Phalen's test -   Finklestein maneuver -   Pain with thumb abduction -         PROCEDURE: After sterile prep, 0.5 cc of Xylocaine and 0.5 cc of Kenalog were injected into the right CMC joint.       [place]  OFFICE PROCEDURE PROGRESS NOTE        Chart reviewed for the following:  I, Richardo Hanks, MD, have reviewed the History, Physical and updated the Allergic reactions for [place] performed immediately prior to start of procedure:  I, Richardo Hanks, MD, have performed the following reviews on Cassandra Daniels prior to the start of the procedure:            * Patient was identified by name and date of birth   * Agreement on procedure being performed was verified  * Risks and Benefits explained to the patient  * Procedure site verified and marked as necessary  * Patient was positioned for comfort  * Consent was signed and verified     Time: 4:53 PM    Date of procedure: 06/10/2016    Procedure performed by:  Richardo Hanks, MD  Provider assisted by: (see medication administration)    How tolerated by patient: tolerated the procedure well with no complications    Comments: none      IMPRESSION:      ICD-10-CM ICD-9-CM    1. Primary osteoarthritis of first carpometacarpal joint of right hand M18.11 715.14 REFERRAL TO HAND SURGERY        PLAN:   1. Patient has experienced recurrent right hand pain for years. Informed the patient that I cannot inject the hand every 4 months. Offered to refer her to a hand specialist.   Risk factors include: htn  2. Yes cortisone injection indicated today: RIGHT FIRST [facility] JOINT   3. No Physical/Occupational Therapy indicated today  4. No diagnostic test indicated today  5. No durable medical equipment indicated today  6. Yes referral indicated today: HAND SPECIALIST   7. No medications indicated today  8. No Narcotic indicated today    RTC PRN  Follow-up Disposition: Not on File    Scribed by Myer Peer [facility]) as dictated by Richardo Hanks, MD     I, Dr. Richardo Hanks, confirm that all documentation is accurate.    Richardo Hanks, M.D.   [facility]

## 2016-06-11 MED ORDER — TRIAMCINOLONE ACETONIDE 40 MG/ML SUSP FOR INJECTION
40 mg/mL | Freq: Once | INTRAMUSCULAR | 0 refills | Status: AC
Start: 2016-06-11 — End: 2016-06-11

## 2016-06-11 NOTE — Addendum Note (Signed)
Addended by: Morene Crocker on: 06/11/2016 09:25 AM      Modules accepted: Orders

## 2016-06-12 NOTE — Telephone Encounter (Signed)
LMOM for pt to return call to possibly resch her pre op testing depending on the date.

## 2016-06-13 NOTE — Progress Notes (Signed)
Patient called in on 06/13/16 stating that her right thumb was swollen and at the base was raised up and painful. After speaking with Sharee Pimple she suggested the patient come in at 2pm on 06/13/16. Patient declined because of a meeting during that time. Patient states she will call back Monday if there is still a problem with her thumb.

## 2016-06-20 ENCOUNTER — Encounter: Attending: Orthopaedic Surgery | Primary: Family Medicine

## 2016-07-07 NOTE — Telephone Encounter (Signed)
Pt called about procedure that needs to be rescheduled due to Dr. Orland Penman , not having privileges @ [facility] . Pt states that they stopped the procedure and Dr. Orland Penman told her to call to have rsc to nearest facility asap . Please call pt @ [phone] wk until 4pm Thanks Jocelynn

## 2016-07-09 ENCOUNTER — Inpatient Hospital Stay: Payer: PRIVATE HEALTH INSURANCE

## 2016-07-09 MED ORDER — TRIMETHOPRIM-SULFAMETHOXAZOLE 160 MG-800 MG TAB
160-800 mg | ORAL_TABLET | Freq: Two times a day (BID) | ORAL | 0 refills | Status: AC
Start: 2016-07-09 — End: 2016-07-14

## 2016-07-09 MED ORDER — LIDOCAINE (PF) 20 MG/ML (2 %) IJ SOLN
20 mg/mL (2 %) | INTRAMUSCULAR | Status: DC | PRN
Start: 2016-07-09 — End: 2016-07-09
  Administered 2016-07-09: 20:00:00 via INTRAVENOUS

## 2016-07-09 MED ORDER — MIDAZOLAM 1 MG/ML IJ SOLN
1 mg/mL | INTRAMUSCULAR | Status: AC
Start: 2016-07-09 — End: ?

## 2016-07-09 MED ORDER — FENTANYL CITRATE (PF) 50 MCG/ML IJ SOLN
50 mcg/mL | INTRAMUSCULAR | Status: AC
Start: 2016-07-09 — End: ?

## 2016-07-09 MED ORDER — FAMOTIDINE 20 MG TAB
20 mg | ORAL | Status: AC
Start: 2016-07-09 — End: 2016-07-10

## 2016-07-09 MED ORDER — LACTATED RINGERS IV
INTRAVENOUS | Status: DC
Start: 2016-07-09 — End: 2016-07-10
  Administered 2016-07-10: 11:00:00 via INTRAVENOUS

## 2016-07-09 MED ORDER — LACTATED RINGERS IV
INTRAVENOUS | Status: DC
Start: 2016-07-09 — End: 2016-07-09
  Administered 2016-07-09: 18:00:00 via INTRAVENOUS

## 2016-07-09 MED ORDER — CEFAZOLIN 2 GM/50 ML IN DEXTROSE (ISO-OSMOTIC) IVPB
2 gram/50 mL | INTRAVENOUS | Status: AC
Start: 2016-07-09 — End: 2016-07-09
  Administered 2016-07-09: 20:00:00 via INTRAVENOUS

## 2016-07-09 MED ORDER — MIDAZOLAM 1 MG/ML IJ SOLN
1 mg/mL | INTRAMUSCULAR | Status: DC | PRN
Start: 2016-07-09 — End: 2016-07-09
  Administered 2016-07-09: 20:00:00 via INTRAVENOUS

## 2016-07-09 MED ORDER — DEXAMETHASONE SODIUM PHOSPHATE 4 MG/ML IJ SOLN
4 mg/mL | INTRAMUSCULAR | Status: AC
Start: 2016-07-09 — End: ?

## 2016-07-09 MED ORDER — HYOSCYAMINE 0.125 MG TAB, RAPID DISSOLVE
0.125 mg | ORAL | Status: DC | PRN
Start: 2016-07-09 — End: 2016-07-10
  Administered 2016-07-09: 22:00:00 via SUBLINGUAL

## 2016-07-09 MED ORDER — SENNOSIDES 8.6 MG TAB
8.6 mg | Freq: Every evening | ORAL | Status: DC
Start: 2016-07-09 — End: 2016-07-10
  Administered 2016-07-10: 04:00:00 via ORAL

## 2016-07-09 MED ORDER — HYDROCODONE-ACETAMINOPHEN 10 MG-325 MG TAB
10-325 mg | ORAL | Status: DC | PRN
Start: 2016-07-09 — End: 2016-07-10
  Administered 2016-07-09 – 2016-07-10 (×4): via ORAL

## 2016-07-09 MED ORDER — DOCUSATE SODIUM 100 MG CAP
100 mg | Freq: Two times a day (BID) | ORAL | Status: DC
Start: 2016-07-09 — End: 2016-07-10
  Administered 2016-07-10: 14:00:00 via ORAL

## 2016-07-09 MED ORDER — ONDANSETRON (PF) 4 MG/2 ML INJECTION
4 mg/2 mL | INTRAMUSCULAR | Status: DC | PRN
Start: 2016-07-09 — End: 2016-07-10

## 2016-07-09 MED ORDER — FAMOTIDINE 20 MG TAB
20 mg | Freq: Once | ORAL | Status: AC
Start: 2016-07-09 — End: 2016-07-09
  Administered 2016-07-09: 18:00:00 via ORAL

## 2016-07-09 MED ORDER — ONDANSETRON (PF) 4 MG/2 ML INJECTION
4 mg/2 mL | INTRAMUSCULAR | Status: AC
Start: 2016-07-09 — End: ?

## 2016-07-09 MED ORDER — BELLADONNA ALKALOIDS-OPIUM 16.2 MG-30 MG RECTAL SUPPOSITORY
Freq: Three times a day (TID) | RECTAL | Status: DC | PRN
Start: 2016-07-09 — End: 2016-07-10

## 2016-07-09 MED ORDER — HYDROCODONE-ACETAMINOPHEN 5 MG-325 MG TAB
5-325 mg | Freq: Once | ORAL | Status: DC
Start: 2016-07-09 — End: 2016-07-09

## 2016-07-09 MED ORDER — DOCUSATE SODIUM 100 MG CAP
100 mg | ORAL_CAPSULE | Freq: Two times a day (BID) | ORAL | 0 refills | Status: AC
Start: 2016-07-09 — End: 2016-08-08

## 2016-07-09 MED ORDER — CEFAZOLIN 2 GM/50 ML IN DEXTROSE (ISO-OSMOTIC) IVPB
2 gram/50 mL | Freq: Three times a day (TID) | INTRAVENOUS | Status: DC
Start: 2016-07-09 — End: 2016-07-10
  Administered 2016-07-10 (×2): via INTRAVENOUS

## 2016-07-09 MED ORDER — KETOROLAC TROMETHAMINE 30 MG/ML INJECTION
30 mg/mL (1 mL) | INTRAMUSCULAR | Status: DC | PRN
Start: 2016-07-09 — End: 2016-07-09
  Administered 2016-07-09: 20:00:00 via INTRAVENOUS

## 2016-07-09 MED ORDER — LIDOCAINE (PF) 20 MG/ML (2 %) IJ SOLN
20 mg/mL (2 %) | INTRAMUSCULAR | Status: AC
Start: 2016-07-09 — End: ?

## 2016-07-09 MED ORDER — ONDANSETRON (PF) 4 MG/2 ML INJECTION
4 mg/2 mL | INTRAMUSCULAR | Status: DC | PRN
Start: 2016-07-09 — End: 2016-07-09
  Administered 2016-07-09: 20:00:00 via INTRAVENOUS

## 2016-07-09 MED ORDER — LIDOCAINE HCL 1 % (10 MG/ML) IJ SOLN
10 mg/mL (1 %) | INTRAMUSCULAR | Status: DC | PRN
Start: 2016-07-09 — End: 2016-07-09
  Administered 2016-07-09: 20:00:00

## 2016-07-09 MED ORDER — LACTATED RINGERS IV
INTRAVENOUS | Status: DC
Start: 2016-07-09 — End: 2016-07-09

## 2016-07-09 MED ORDER — LIDOCAINE (PF) 10 MG/ML (1 %) IJ SOLN
10 mg/mL (1 %) | INTRAMUSCULAR | Status: DC | PRN
Start: 2016-07-09 — End: 2016-07-09

## 2016-07-09 MED ORDER — HYDROMORPHONE 1 MG/ML (1 ML) IN 0.9 % NACL IV SYRINGE
1 mg/mL ( mL) | INTRAVENOUS | Status: DC | PRN
Start: 2016-07-09 — End: 2016-07-09

## 2016-07-09 MED ORDER — HYDROMORPHONE 2 MG TAB
2 mg | ORAL_TABLET | ORAL | 0 refills | Status: DC | PRN
Start: 2016-07-09 — End: 2016-07-10

## 2016-07-09 MED ORDER — HYDROMORPHONE 2 MG/ML INJECTION SOLUTION
2 mg/mL | INTRAMUSCULAR | Status: AC
Start: 2016-07-09 — End: 2016-07-09
  Administered 2016-07-09: 23:00:00

## 2016-07-09 MED ORDER — PROPOFOL 10 MG/ML IV EMUL
10 mg/mL | INTRAVENOUS | Status: DC | PRN
Start: 2016-07-09 — End: 2016-07-09
  Administered 2016-07-09 (×2): via INTRAVENOUS

## 2016-07-09 MED ORDER — SODIUM CHLORIDE 0.9 % IJ SYRG
Freq: Three times a day (TID) | INTRAMUSCULAR | Status: DC
Start: 2016-07-09 — End: 2016-07-10
  Administered 2016-07-10 (×2): via INTRAVENOUS

## 2016-07-09 MED ORDER — CONJUGATED ESTROGENS 0.625 MG/G VAGINAL CREAM
0.625 mg/gram | VAGINAL | Status: AC
Start: 2016-07-09 — End: ?

## 2016-07-09 MED ORDER — ACETAMINOPHEN 500 MG TAB
500 mg | Freq: Four times a day (QID) | ORAL | Status: DC | PRN
Start: 2016-07-09 — End: 2016-07-09
  Administered 2016-07-09: 22:00:00 via ORAL

## 2016-07-09 MED ORDER — FENTANYL CITRATE (PF) 50 MCG/ML IJ SOLN
50 mcg/mL | INTRAMUSCULAR | Status: AC
Start: 2016-07-09 — End: 2016-07-09
  Administered 2016-07-09: 21:00:00 via INTRAVENOUS

## 2016-07-09 MED ORDER — HYDROMORPHONE 2 MG/ML INJECTION SOLUTION
2 mg/mL | INTRAMUSCULAR | Status: DC | PRN
Start: 2016-07-09 — End: 2016-07-09

## 2016-07-09 MED ORDER — BUPIVACAINE-EPINEPHRINE (PF) 0.25 %-1:200,000 IJ SOLN
0.25 %-1:200,000 | INTRAMUSCULAR | Status: AC
Start: 2016-07-09 — End: ?

## 2016-07-09 MED ORDER — FENTANYL CITRATE (PF) 50 MCG/ML IJ SOLN
50 mcg/mL | INTRAMUSCULAR | Status: DC | PRN
Start: 2016-07-09 — End: 2016-07-09
  Administered 2016-07-09 (×4): via INTRAVENOUS

## 2016-07-09 MED ORDER — LIDOCAINE-EPINEPHRINE 1 %-1:100,000 IJ SOLN
1 %-:00,000 | INTRAMUSCULAR | Status: AC
Start: 2016-07-09 — End: ?

## 2016-07-09 MED ORDER — KETOROLAC TROMETHAMINE 30 MG/ML INJECTION
30 mg/mL (1 mL) | Freq: Four times a day (QID) | INTRAMUSCULAR | Status: DC
Start: 2016-07-09 — End: 2016-07-10
  Administered 2016-07-10 (×2): via INTRAVENOUS

## 2016-07-09 MED ORDER — SODIUM CHLORIDE 0.9 % IJ SYRG
INTRAMUSCULAR | Status: DC | PRN
Start: 2016-07-09 — End: 2016-07-10

## 2016-07-09 MED ORDER — FENTANYL CITRATE (PF) 50 MCG/ML IJ SOLN
50 mcg/mL | INTRAMUSCULAR | Status: DC | PRN
Start: 2016-07-09 — End: 2016-07-09
  Administered 2016-07-09 (×2): via INTRAVENOUS

## 2016-07-09 MED ORDER — KETOROLAC TROMETHAMINE 30 MG/ML INJECTION
30 mg/mL (1 mL) | INTRAMUSCULAR | Status: AC
Start: 2016-07-09 — End: ?

## 2016-07-09 MED ORDER — SODIUM CHLORIDE 0.9 % IRRIGATION SOLN
0.9 % | Status: DC | PRN
Start: 2016-07-09 — End: 2016-07-09
  Administered 2016-07-09: 20:00:00

## 2016-07-09 MED ORDER — BUPIVACAINE-EPINEPHRINE (PF) 0.25 %-1:200,000 IJ SOLN
0.25 %-1:200,000 | INTRAMUSCULAR | Status: DC | PRN
Start: 2016-07-09 — End: 2016-07-09
  Administered 2016-07-09: 20:00:00

## 2016-07-09 MED ORDER — DIPHENHYDRAMINE HCL 50 MG/ML IJ SOLN
50 mg/mL | INTRAMUSCULAR | Status: DC | PRN
Start: 2016-07-09 — End: 2016-07-09

## 2016-07-09 MED ORDER — DEXAMETHASONE SODIUM PHOSPHATE 4 MG/ML IJ SOLN
4 mg/mL | INTRAMUSCULAR | Status: DC | PRN
Start: 2016-07-09 — End: 2016-07-09
  Administered 2016-07-09: 20:00:00 via INTRAVENOUS

## 2016-07-09 MED ORDER — PROPOFOL 10 MG/ML IV EMUL
10 mg/mL | INTRAVENOUS | Status: AC
Start: 2016-07-09 — End: ?

## 2016-07-09 MED FILL — MAPAP EXTRA STRENGTH 500 MG TABLET: 500 mg | ORAL | Qty: 2

## 2016-07-09 MED FILL — LIDOCAINE (PF) 20 MG/ML (2 %) IJ SOLN: 20 mg/mL (2 %) | INTRAMUSCULAR | Qty: 5

## 2016-07-09 MED FILL — ONDANSETRON (PF) 4 MG/2 ML INJECTION: 4 mg/2 mL | INTRAMUSCULAR | Qty: 2

## 2016-07-09 MED FILL — FENTANYL CITRATE (PF) 50 MCG/ML IJ SOLN: 50 mcg/mL | INTRAMUSCULAR | Qty: 2

## 2016-07-09 MED FILL — BD POSIFLUSH NORMAL SALINE 0.9 % INJECTION SYRINGE: INTRAMUSCULAR | Qty: 10

## 2016-07-09 MED FILL — LIDOCAINE-EPINEPHRINE 1 %-1:100,000 IJ SOLN: 1 %-:00,000 | INTRAMUSCULAR | Qty: 20

## 2016-07-09 MED FILL — PREMARIN 0.625 MG/GRAM VAGINAL CREAM: 0.625 mg/gram | VAGINAL | Qty: 30

## 2016-07-09 MED FILL — HYOSCYAMINE 0.125 MG TAB, RAPID DISSOLVE: 0.125 mg | ORAL | Qty: 1

## 2016-07-09 MED FILL — LACTATED RINGERS IV: INTRAVENOUS | Qty: 1000

## 2016-07-09 MED FILL — HYDROMORPHONE 2 MG/ML INJECTION SOLUTION: 2 mg/mL | INTRAMUSCULAR | Qty: 1

## 2016-07-09 MED FILL — PROPOFOL 10 MG/ML IV EMUL: 10 mg/mL | INTRAVENOUS | Qty: 20

## 2016-07-09 MED FILL — HYDROCODONE-ACETAMINOPHEN 10 MG-325 MG TAB: 10-325 mg | ORAL | Qty: 2

## 2016-07-09 MED FILL — XYLOCAINE-MPF 10 MG/ML (1 %) INJECTION SOLUTION: 10 mg/mL (1 %) | INTRAMUSCULAR | Qty: 0.1

## 2016-07-09 MED FILL — DEXAMETHASONE SODIUM PHOSPHATE 4 MG/ML IJ SOLN: 4 mg/mL | INTRAMUSCULAR | Qty: 1

## 2016-07-09 MED FILL — FAMOTIDINE 20 MG TAB: 20 mg | ORAL | Qty: 1

## 2016-07-09 MED FILL — KETOROLAC TROMETHAMINE 30 MG/ML INJECTION: 30 mg/mL (1 mL) | INTRAMUSCULAR | Qty: 1

## 2016-07-09 MED FILL — MARCAINE-EPINEPHRINE (PF) 0.25 %-1:200,000 INJECTION SOLUTION: 0.25 %-1:200,000 | INTRAMUSCULAR | Qty: 30

## 2016-07-09 MED FILL — MIDAZOLAM 1 MG/ML IJ SOLN: 1 mg/mL | INTRAMUSCULAR | Qty: 2

## 2016-07-09 MED FILL — CEFAZOLIN 2 GM/50 ML IN DEXTROSE (ISO-OSMOTIC) IVPB: 2 gram/50 mL | INTRAVENOUS | Qty: 50

## 2016-07-09 NOTE — Op Note (Signed)
[place]    Name:  WAVE, POCOCK  MR#:  [id number]  DOB:  09/27/1960  Account #:  [id number]  Date of Adm:  07/09/2016  Date of Surgery:  07/09/2016      SURGEON: Elvin So, MD    PREOPERATIVE DIAGNOSIS: Status post hysterectomy and  rectocele, grade 3.    POSTOPERATIVE DIAGNOSIS: Status post hysterectomy and  rectocele, grade 3.    PROCEDURES PERFORMED: Examination under anesthesia, vaginal  rectocele repair (posterior colporrhaphy).    ANESTHESIA: General.    ANTIBIOTICS: Ancef.    COMPLICATIONS: None.    DRAINS: A 16-French Foley catheter and vaginal packing.    ESTIMATED BLOOD LOSS: 40 mL.    SPECIMENS REMOVED: None.    FINDINGS: Post-hysterectomy, grade 3 rectocele with severe  rectovaginal fascial defect.    INDICATIONS: This is a 55 year old female who has had a history of  hysterectomy and also an anterior vaginal repair who presents with  rectocele and a rectal bulge and symptomatic with constipation. Risks,  benefits, alternatives discussed with the patient, consent was obtained.  Decision was made to proceed with rectocele repair.    DESCRIPTION OF PROCEDURE: After consent was obtained, the  patient was identified, brought into the operating room and placed in a  supine position.    Anesthesia was administered. She received a dose of intravenous  antibiotics. She was then put in dorsal lithotomy position. All pressure  points were properly padded. The genital area was prepped and  draped in normal sterile fashion.    A timeout was conducted, identifying the patient, procedure.    Under anesthesia, the vaginal and rectal area were carefully  examined. There was no cystocele appreciated at this point. There  was a roughly 7 cm vaginal length with some scarring on the apex.  There was a grade 3, bordering grade 4 rectocele seen with  anesthesia under relaxation. Rectal exam appeared to have a defect.  Superiorly, there was no mass appreciated in the  rectum. The  midportion of the rectocele was then grasped with 2 Allis clamps and it  was hydrodistended using a mixture of 1% lidocaine and 0.5%  Marcaine. I then made a midline incision 6 cm right over the rectocele.  Careful dissection raised a vaginal flap, separating the rectovaginal  fascia from the vaginal epithelium. This was performed with blunt and  sharp dissection using Metzenbaum scissors. There appeared to be a  very large rectovaginal fascial defect centrally. We then copiously  irrigated the wound. There was no enterocele appreciated. The defect  was then closed in several plicating suture in running fashion using 2-0  Vicryl. We started at the apex of the incision and plicated it and the  rectovaginal fascia in rows. A second row of plication was performed.  The rectocele was completely reduced at this point. There was an  excess posterior vaginal epithelium that was then trimmed with a  curved Mayo scissors. The vaginal epithelium was then approximated  in an interrupted fashion using a 2-0 Vicryl. The wound was copiously  irrigated and a 16-French Foley catheter was placed that drained the  bladder, filled with 10 mL in the balloon. The exposure equipment used  during the procedure was a Acupuncturist. At this point, the  retractor was removed and a vaginal packing dipped in Premarin  cream was placed in the vagina. The patient was awakened from  anesthesia and transferred to PACU in stable condition.  Elvin So, MD    AO / CD  D:  07/09/2016   16:54  T:  07/10/2016   05:36  Job #:  [id number]

## 2016-07-09 NOTE — Anesthesia Pre-Procedure Evaluation (Addendum)
Anesthetic History               Review of Systems / Medical History  Patient summary reviewed and pertinent labs reviewed    Pulmonary        Sleep apnea: No treatment           Neuro/Psych   Within defined limits           Cardiovascular  Within defined limits                Exercise tolerance: >4 METS     GI/Hepatic/Renal  Within defined limits              Endo/Other  Within defined limits           Other Findings   Comments:   Risk Factors for Postoperative nausea/vomiting:       History of postoperative nausea/vomiting?  NO       Female?  YES       Motion sickness?  NO       Intended opioid administration for postoperative analgesia?  YES      Smoking Abstinence  Current Smoker?   NO  Elective Surgery? YES  Seen preoperatively by anesthesiologist or proxy prior to day of surgery?  YES  Pt abstained from smoking 24 hours prior to anesthesia? N/A         Physical Exam    Airway  Mallampati: II  TM Distance: 4 - 6 cm  Neck ROM: normal range of motion   Mouth opening: Normal     Cardiovascular  Regular rate and rhythm,  S1 and S2 normal,  no murmur, click, rub, or gallop  Rhythm: regular  Rate: normal         Dental    Dentition: Lower dentition intact, Upper dentition intact and Caps/crowns  Comments: Temp crown L upper molar   Pulmonary  Breath sounds clear to auscultation               Abdominal  GI exam deferred       Other Findings            Anesthetic Plan    ASA: 3  Anesthesia type: general          Induction: Intravenous  Anesthetic plan and risks discussed with: Patient

## 2016-07-09 NOTE — Progress Notes (Signed)
TRANSFER - IN REPORT:    Verbal report received from Dominica on Trenady Debenedetto  being received from PACU for routine post - op      Report consisted of patient???s Situation, Background, Assessment and   Recommendations(SBAR).     Information from the following report(s) SBAR, OR Summary and Intake/Output was reviewed with the receiving nurse.    Opportunity for questions and clarification was provided.      Assessment completed upon patient???s arrival to unit and care assumed.   Received pt via bed awake and alert. Foley draining clear yellow urine. Oriented to call bell, phone and IS with pt giving return demonstration.

## 2016-07-09 NOTE — H&P (Signed)
??  ??  ??  ??  Cassandra Daniels  23-Dec-1960  ??  ??  Assessment and Plan:  1. Grade 3 Rectocele and Grade 1 Cystocele  S/p hysterectomy                        Discussed surgical intervention options of no intervention, use of pessary, more traditional repair of cystocele, enterocele or rectocele with autologou fascia as support, the use of mesh for support,colpocleisis or the minimally invasive robotic assisted laparoscopic sacrocolpopexy. ??    Patient elects repair.   Will proceed with Vaginal Rectocele repair possible Perineorrhaphy today.          Chief Complaint   Patient presents with   ??? Rectocele and Cystocele   ??  ??  History of Present Illness:  Cassandra Daniels is a 55 y.o. female G3P3 presents today to further discuss management options for her known bladder prolapse. She is also with urgency, frequency and PFD.   ??  Today the patient is doing well.   Still able to feel the prolapse. Increasing in severity, as if it is about to "fall out".  Reports sensations of stool "stuck in the bulge"   Maneuvers body to release bowels.   Some??dyspareunia.   Denies pelvic pain.  Denies vaginal discharge.  Denies vaginal tearing with intercourse.   ??  Urinary sx's:  +Urinary urgency and frequency.   Good FOS  Denies straining to void.   Denies sensations of incomplete bladder emptying.   Denies dysuria, gross hematuria.   Asymptomatic for UTI with no significant h/o UTI's.   ??  Episodes of fecal urgency.   Denies chronic constipation.  Normal BM's  ??  Denies neurological issues.   Denies pelvic or back issues.   Denies prior kidney or bladder surgeries.  Denies prior history of nephrolithiasis.  ??  S/p post menopausal posthysterectomy at age 40  S/p cholecystectomy & gastric bypass.   ??       Past Medical History:   Diagnosis Date   ??? Arthritis ??   ??? Atherosclerotic cardiovascular disease ??   ??? Atrial fibrillation (HCC) ??   ??? Bilateral knee pain ??   ??? Broken nose 1999   ?? surgery   ??? Cardiac echocardiogram 10/17/2014    ?? EF 60-65%.  No WMA.  Mild LVH.  Gr 1 DDfx.  No MVP.  Similar to study of 05/24/10.   ??? Cardiac Holter monitor study 03/24/2012   ?? Sinus rhythm, avg HR 95 bpm.  Rare PVCs.  No sustained arrhythmias, heart block, or pauses.   ??? Cardiac nuclear imaging test, low risk 07/07/2012   ?? Low risk.  No ischemia or prior infarction.  EF >80%.  No WMA.  Neg EKG on pharm stress test.   ??? Carpal tunnel syndrome ??   ??? Chronic fatigue ??   ??? Essential hypertension ??   ??? Fibrocystic breast ??   ??? HTN ??   ??? Hypothyroidism ??   ??? Left groin pain ??   ??? Left hip pain ??   ??? Meningioma nos ??   ??? Mitral (valve) prolapse ??   ??? MVP (mitral valve prolapse) ??   ??? Pancreatitis 2014   ??? Paroxysmal atrial tachycardia (HCC) ??   ??? PVC (premature ventricular contraction) ??   ??? Right hip pain ??   ??? Right shoulder pain ??   ??? SVT (supraventricular tachycardia) (HCC) ??   ??? Unspecified adverse  effect of anesthesia ??   ?? difficulty waking up    ??? Unspecified sleep apnea ??   ?? uses cpap machine   ??? Vitamin B 12 deficiency ??   ??  ??        Past Surgical History:   Procedure Laterality Date   ??? ABLATION OF SVT ?? 2007   ?? by Dr. Serita Sheller   ??? HX BLADDER REPAIR ?? 1996   ??? HX CHOLECYSTECTOMY ?? 2014   ??? HX GASTRIC BYPASS ?? 11/07   ??? HX HYSTERECTOMY ?? ??   ??? HX KNEE ARTHROSCOPY ?? ??   ??? HX RHINOPLASTY ?? ??   ??? HX SHOULDER ARTHROSCOPY ?? ??   ??? MAM BIOPSY BREAST STEREOTACTIC ?? april, 2013   ?? right, benign   ??  ??       Social History   Substance Use Topics   ??? Smoking status: Never Smoker   ??? Smokeless tobacco: Never Used   ??? Alcohol use No   ??  ??        Allergies   Allergen Reactions   ??? Beta Blocker [Beta-Blockers (Beta-Adrenergic Blocking Agts)] Other (comments)   ?? ?? Fatigue   ??? Tetracycline Hives   ??  ??         Family History   Problem Relation Age of Onset   ??? Asthma Mother ??   ??? Breast Cancer Mother 64   ??? Cancer Mother ??   ??? Other Father ??   ?? ?? cabg   ??? Heart Disease Father ??   ??? High Cholesterol Father ??   ??? Heart Attack Maternal Grandmother 39    ??? Breast Cancer Maternal Grandmother 50   ??  ??         Current Outpatient Prescriptions   Medication Sig Dispense Refill   ??? guaiFENesin (TUSSIN EXPECTORANT) 100 mg/5 mL liquid Take 10 mL by mouth three (3) times daily as needed for Cough. 100 mL 0   ??? levoFLOXacin (LEVAQUIN) 750 mg tablet TK 1 T PO D FOR 10 DAYS ?? 0   ??? VIRTUSSIN AC 10-100 mg/5 mL solution TK 1 TEASPOON PO Q 6 H PRN FOR COUGH ?? 0   ??? cyclobenzaprine (FLEXERIL) 10 mg tablet Take  by mouth three (3) times daily as needed for Muscle Spasm(s). ?? ??   ??? traMADol (ULTRAM) 50 mg tablet 1-2  Every day  prn  Indications: NEUROPATHIC PAIN, Pain 60 Tab 2   ??? zolpidem CR (AMBIEN CR) 12.5 mg tablet Take 1 Tab by mouth nightly as needed for Sleep for up to 30 days. Max Daily Amount: 12.5 mg. Indications: INSOMNIA 30 Tab 2   ??? [START ON 07/11/2016] HYDROcodone-acetaminophen (NORCO) 7.5-325 mg per tablet Take 1 Tab by mouth two (2) times daily as needed for Pain for up to 30 days. Max Daily Amount: 2 Tabs. Indications: Pain 60 Tab 0   ??? diclofenac (VOLTAREN) 1 % gel Apply  to affected area four (4) times daily. ?? ??   ??? zolpidem CR (AMBIEN CR) 6.25 mg tablet Take 6.25 mg by mouth nightly as needed for Sleep. ?? ??   ??? ferrous sulfate (IRON) 325 mg (65 mg iron) tablet Take 325 mg by mouth Daily (before breakfast). ?? ??   ??? primidone (MYSOLINE) 50 mg tablet TAKE 1 TABLET BY MOUTH EVERY MORNING AND 2 TABLETS BY MOUTH EVERY NIGHT AT BEDTIME (Patient taking differently: 50 mg two (2) times a day.) 270 Tab 3   ???  hyoscyamine SL (LEVSIN/SL) 0.125 mg SL tablet Take 1 Tab by mouth every six (6) hours as needed for Cramping. 30 Tab 3   ??? CYANOCOBALAMIN, VITAMIN B-12, (VITAMIN B-12 PO) Take 1 Tab by mouth daily. ?? ??   ??  ??  ??  ??  Review of Systems  Constitutional: Fever: No  Skin: Rash: No  HEENT: Hearing difficulty: No  Eyes: Blurred vision: No  Cardiovascular: Chest pain: No  Respiratory: Shortness of breath: No  Gastrointestinal: Nausea/vomiting: No   Musculoskeletal: Back pain: Yes  Neurological: Weakness: No  Psychological: Memory loss: No  Comments/additional findings:   ??  ??  ??  ??  PHYSICAL EXAMINATION:   ??       Visit Vitals   ??? BP 116/82   ??? Ht 5' 2.5" (1.588 m)   ??? Wt 179 lb (81.2 kg)   ??? BMI 32.22 kg/m2   ??  Constitutional: Well developed, well-nourished female in no acute distress.   CV:  No peripheral swelling noted  Respiratory: No respiratory distress or difficulties  Abdomen:  Soft and nontender. No masses. No hepatosplenomegaly, No CVA tenderness, no Suprapubic tenderness. Well healed abdominoplasty and laproscopic incisions.   Skin:  Normal color. No evidence of jaundice.     Neuro/Psych:  Patient with appropriate affect.  Alert and oriented.    ??  ??  REVIEW OF LABS AND IMAGING:    ??        Results for orders placed or performed in visit on 05/27/16   AMB POC URINALYSIS DIP STICK AUTO W/O MICRO   Result Value Ref Range   ?? Color (UA POC) Yellow ??   ?? Clarity (UA POC) Clear ??   ?? Glucose (UA POC) Negative Negative   ?? Bilirubin (UA POC) Negative Negative   ?? Ketones (UA POC) Negative Negative   ?? Specific gravity (UA POC) 1.010 1.001 - 1.035   ?? Blood (UA POC) 2+ Negative   ?? pH (UA POC) 5.5 4.6 - 8.0   ?? Protein (UA POC) Negative Negative mg/dL   ?? Urobilinogen (UA POC) 0.2 mg/dL 0.2 - 1   ?? Nitrites (UA POC) Negative Negative   ?? Leukocyte esterase (UA POC) Negative Negative   ??  ??  ??  A copy of today's office visit with all pertinent imaging results and labs were sent to the referring physician,JEANNINE York Cerise, MD  ??  ??  ??  Elvin So, MD

## 2016-07-09 NOTE — Other (Signed)
Husband- Frankey Poot- ok to speak to

## 2016-07-09 NOTE — Anesthesia Post-Procedure Evaluation (Signed)
Post-Anesthesia Evaluation and Assessment    Patient: Cassandra Daniels MRN: [id number]  SSN: [id number]    Date of Birth: 1961-08-03  Age: 55 y.o.  Sex: female      Data from PACU flowsheet    Cardiovascular Function/Vital Signs  Visit Vitals   ??? BP 136/87   ??? Pulse 94   ??? Temp 36.2 ??C (97.1 ??F)   ??? Resp 13   ??? Ht 5' 2.5" (1.588 m)   ??? Wt 80.7 kg (178 lb)   ??? SpO2 100%   ??? BMI 32.04 kg/m2       Patient is status post general anesthesia for Procedure(s):  vaginal rectocele repair,  perineorrhaphy, CYSTOCELE REPAIR.    Nausea/Vomiting: controlled    Postoperative hydration reviewed and adequate.    Pain:3,4  Pain Scale 1: Numeric (0 - 10) (07/09/16 1629)  Pain Intensity 1: 5 (pt states pain med isn't helping that she needs home med) (07/09/16 1629)   Managed      Mental Status and Level of Consciousness: Alert and oriented     Pulmonary Status:   O2 Device: Room air (07/09/16 1630)   Adequate oxygenation and airway patent    Complications related to anesthesia: None    Post-anesthesia assessment completed. No concerns    Signed By: Maudry Diego, CRNA     July 09, 2016

## 2016-07-09 NOTE — Brief Op Note (Signed)
BRIEF OPERATIVE NOTE    Date of Procedure: 07/09/2016   Preoperative Diagnosis: n81.6 rectocele grade 3 post hysterectomy  Postoperative Diagnosis: same as above  Procedure(s):  vaginal rectocele repair, Examination under anesthesia  Surgeon(s) and Role:     * Elvin So, MD - Primary         Assistant Staff:       Surgical Staff:  Circ-1: Vivi Ferns, RN  Scrub Tech-1: Thomasene Lot Felty  Scrub RN-1: Phylliss Bob, RN  Surg Asst-1: Benjamin Stain [place]  Event Time In   Incision Start  3:02 PM   Incision Close  3:55 PM     Anesthesia: General     Estimated Blood Loss: 35ml  Specimens: * No specimens in log *   Findings: rectocele Grade III, severe rectovaginal fascia defect   Complications: none   Implants: * No implants in log *  Drain: 16Fr foley   Vaginal packing in premarin in place    Elvin So, MD

## 2016-07-10 MED ORDER — HYDROMORPHONE 2 MG TAB
2 mg | ORAL_TABLET | ORAL | 0 refills | Status: DC | PRN
Start: 2016-07-10 — End: 2017-01-30

## 2016-07-10 MED ORDER — HYDROMORPHONE (PF) 1 MG/ML IJ SOLN
1 mg/mL | INTRAMUSCULAR | Status: DC | PRN
Start: 2016-07-10 — End: 2016-07-10
  Administered 2016-07-10 (×2): via INTRAVENOUS

## 2016-07-10 MED ORDER — HYDROMORPHONE 1 MG/ML (1 ML) IN 0.9 % NACL IV SYRINGE
1 mg/mL ( mL) | INTRAVENOUS | Status: DC | PRN
Start: 2016-07-10 — End: 2016-07-09
  Administered 2016-07-10: 04:00:00 via INTRAVENOUS

## 2016-07-10 MED ORDER — DIPHENHYDRAMINE HCL 50 MG/ML IJ SOLN
50 mg/mL | Freq: Four times a day (QID) | INTRAMUSCULAR | Status: DC | PRN
Start: 2016-07-10 — End: 2016-07-09
  Administered 2016-07-10: 01:00:00 via INTRAVENOUS

## 2016-07-10 MED ORDER — HYDROMORPHONE 2 MG TAB
2 mg | ORAL | Status: DC | PRN
Start: 2016-07-10 — End: 2016-07-10
  Administered 2016-07-10: 18:00:00 via ORAL

## 2016-07-10 MED FILL — CEFAZOLIN 2 GM/50 ML IN DEXTROSE (ISO-OSMOTIC) IVPB: 2 gram/50 mL | INTRAVENOUS | Qty: 50

## 2016-07-10 MED FILL — HYDROMORPHONE 2 MG TAB: 2 mg | ORAL | Qty: 1

## 2016-07-10 MED FILL — HYDROMORPHONE (PF) 1 MG/ML IJ SOLN: 1 mg/mL | INTRAMUSCULAR | Qty: 1

## 2016-07-10 MED FILL — HYDROCODONE-ACETAMINOPHEN 10 MG-325 MG TAB: 10-325 mg | ORAL | Qty: 1

## 2016-07-10 MED FILL — SENNA 8.6 MG TABLET: 8.6 mg | ORAL | Qty: 2

## 2016-07-10 MED FILL — KETOROLAC TROMETHAMINE 30 MG/ML INJECTION: 30 mg/mL (1 mL) | INTRAMUSCULAR | Qty: 1

## 2016-07-10 MED FILL — DOCUSATE SODIUM 100 MG CAP: 100 mg | ORAL | Qty: 1

## 2016-07-10 MED FILL — HYDROCODONE-ACETAMINOPHEN 10 MG-325 MG TAB: 10-325 mg | ORAL | Qty: 2

## 2016-07-10 MED FILL — DIPHENHYDRAMINE HCL 50 MG/ML IJ SOLN: 50 mg/mL | INTRAMUSCULAR | Qty: 1

## 2016-07-10 MED FILL — HYDROMORPHONE 1 MG/ML (1 ML) IN 0.9 % NACL IV SYRINGE: 1 mg/mL ( mL) | INTRAVENOUS | Qty: 1

## 2016-07-10 NOTE — Discharge Summary (Signed)
Discharge Summary     Patient: Cassandra Daniels MRN: [id number]  SSN: [id number]    Date of Birth: 01/22/1961  Age: 55 y.o.  Sex: female      Admit Date: 07/09/2016    Discharge Date: 07/10/2016     * Admission Diagnoses:  n81.6 rectocele grade 3;Rectocele     * Discharge Diagnoses:   Hospital Problems as of 07/10/2016  Date Reviewed: 2016-07-05          Codes Class Noted - Resolved POA    Rectocele ICD-10-CM: N81.6  ICD-9-CM: 618.04  07/09/2016 - Present Unknown               * Procedures for this admission: Rectocele repair  Procedure(s):  vaginal rectocele repair,  perineorrhaphy, CYSTOCELE REPAIR      * Disposition: Home    * Discharged Condition: improved    * Hospital Course:  Uneventful postop course.  Ambulating and tolerating diet.  Patient voices no concerns regarding her being able to care for herself after discharge.    Discharge Medications:   Current Discharge Medication List      START taking these medications    Details   HYDROmorphone (DILAUDID) 2 mg tablet Take 1 Tab by mouth every four (4) hours as needed for Pain. Max Daily Amount: 12 mg.  Qty: 24 Tab, Refills: 0      trimethoprim-sulfamethoxazole (BACTRIM DS, SEPTRA DS) 160-800 mg per tablet Take 1 Tab by mouth two (2) times a day for 5 days.  Qty: 10 Tab, Refills: 0      docusate sodium (COLACE) 100 mg capsule Take 1 Cap by mouth two (2) times a day for 30 days.  Qty: 60 Cap, Refills: 0         CONTINUE these medications which have NOT CHANGED    Details   acetaminophen (TYLENOL) 325 mg tablet Take  by mouth every four (4) hours as needed for Pain.      traMADol (ULTRAM-ER) 100 mg Tb24 Take 100 mg by mouth daily.      venlafaxine-SR (EFFEXOR-XR) 150 mg capsule Take 150 mg by mouth daily.      zolpidem CR (AMBIEN CR) 12.5 mg tablet Take 12.5 mg by mouth nightly as needed.  Refills: 2      primidone (MYSOLINE) 50 mg tablet Take 50 mg by mouth daily.      cyclobenzaprine (FLEXERIL) 10 mg tablet Take  by mouth three (3) times daily as needed  for Muscle Spasm(s).      HYDROcodone-acetaminophen (NORCO) 7.5-325 mg per tablet Take 1 Tab by mouth two (2) times daily as needed for Pain for up to 30 days. Max Daily Amount: 2 Tabs. Indications: Pain  Qty: 60 Tab, Refills: 0      diclofenac (VOLTAREN) 1 % gel Apply  to affected area as needed.      ferrous sulfate (IRON) 325 mg (65 mg iron) tablet Take 325 mg by mouth every seven (7) days.      hyoscyamine SL (LEVSIN/SL) 0.125 mg SL tablet Take 1 Tab by mouth every six (6) hours as needed for Cramping.  Qty: 30 Tab, Refills: 3      CYANOCOBALAMIN, VITAMIN B-12, (VITAMIN B-12 PO) Take 1 Tab by mouth every seven (7) days.      clonazePAM (KLONOPIN) 0.25 mg disintegrating tablet Take 0.5 mg by mouth three (3) times daily.              * Follow-up Care/Patient Instructions:  Activity: No lifting, Driving, or Strenuous exercise for 6 weeks  Diet: Regular Diet  Wound Care: Ice to area for comfort    Follow-up Information     Follow up With Details [place]  [address]  [place] [address]  [phone]             Signed By: Mal Amabile, MD     July 10, 2016

## 2016-07-10 NOTE — Progress Notes (Signed)
Problem: Discharge Planning  Goal: *Discharge to safe environment  Outcome: Progressing Towards Goal  Plan is to discharge to a safe environment

## 2016-07-10 NOTE — Progress Notes (Addendum)
[id number] Bedside and Verbal shift change report given to Dalbert Batman, RN (oncoming nurse) by Nicole Kindred, RN (offgoing nurse). Report included the following information SBAR, Kardex and MAR.     [phone] Due meds administered and shift assessment completed pt tolerated po med and denied respiratory distress/discomfort at this time.      1100 Post foley removal f/u DTV and has voided.

## 2016-07-10 NOTE — Progress Notes (Signed)
[facility]   Discharge Planning/Social Services Assessment    Reasons for Intervention: Interviewed patient, she agrees to share her discharge information with her spouse, see below.  Verified demographic information.  She was independent prior to admission and sees Dr Cecile Sheerer for her primary care needs.  Her discharge plan is to return home.     High Risk Criteria   Yes  No   Physician Referral   Yes  No        Date    Nursing Referral   Yes  No        Date    Patient/Family Request   Yes  No        Date       Resources:    Medicare   Yes  No     Medicaid   Yes  No   No Resources   Yes  No   Private Insurance   Yes  No [employer]   Case Manager Name/Phone Number    Other   Yes  No        (i.e. Workman's Comp)         Prior Services:    Prior Services   Yes  No   Home Health   Yes  No   [place]   Yes  No        Number of Hours    Home Care Program   Yes  No   Case Manager    Meals on Wheels   Yes  No   Office on Aging   Yes  No   Transportation Services   Yes  No   Nursing Home   Yes  No        Nursing Home Name    [facility]   Yes  No        [facility] Name    Other       Information Source:      Information obtained from   Patient   Parent    Guardian   Child   Spouse    Significant Other/Partner    Friend       EMS     Nursing Home Chart           Other:chart   Chart Review   Yes  No     Family/Support System:    Patient lives with   Alone     Spouse    Significant Other   Children   Caretaker    Parent   Sibling      Other       Other Support System:    Is the patient responsible for care of others   Yes  No   Information of person caring for patient on  discharge self   Managers financial affairs independently   Yes  No   If no, explain:      Status Prior to Admission:    Mental Status   Awake   Alert   Oriented   Quiet/Calm  Lethargic/Sedated    Disoriented   Restless/Anxious   [place]    Meal Preparation Ability   Independent    Standby Assistance    Minimal Assistance    Moderate Assistance   Maximum Assistance      Total Assistance   Chores   Independent with  [place] Resident    Requires Assistance   Bowel/Bladder   Continent   Catheter   Incontinent   Ostomy Self-Care     Urine Diversion Self-Care   Maximum Assistance      Total Assistance   Number of Persons needed for assistance    DME at home   [place], [place], Straight    Commode     Bathroom/Grab [facility] Bed   Nebulizer   Oxygen            Raised Toilet Seat   Shower Chair   Side Rails for Bed    Tub Transfer Bench    Walker, Building surveyor, Standard       Other:   Vendor      Treatment Presently Receiving:    Current Treatments   Chemotherapy   Dialysis   Insulin   IVAB  IVF    O2   PCA    PT    RT    Tube Feedings    Wound Care     Psychosocial Evaluation:    Verbalized Knowledge of Disease Process   Patient  Family   Coping with Disease Process   Patient  Family   Requires Further Counseling Coping with Disease Process   Patient  Family     Identified Projected Needs:    Home Health Aid   Yes  No   Transportation   Yes  No   Education   Yes  No        Specific Education     Financial Counseling   Yes  No   Inability to Care for Self/Will Require 24 hour care   Yes  No   Pain Management   Yes  No   Home Infusion Therapy   Yes  No   Oxygen Therapy   Yes  No   DME   Yes  No   Long Term Care Placement   Yes  No   Rehab   Yes  No   Physical Therapy   Yes  No   Needs Anticipated At This Time   Yes  No     Intra-Hospital Referral:    [place]   Yes  No   Life Coach   Yes  No   Patient Representative   Yes  No   Staff for Teaching Needs   Yes  No   Specialty Teaching Needs     Diabetic Educator   Yes  No   Referral for Diabetic Educator Needed   Yes  No  If Yes, place order for Nutritionist or Diabetic Consult     Tentative Discharge Plan:    Home with No Services   Yes  No    Home with Home Health Follow-up   Yes  No        If Yes, specify type    Home Care Program   Yes  No        If Yes, specify type    Meals on Wheels   Yes  No   Office of Aging   Yes  No   NHP   Yes  No   Return to the Nursing Home   Yes  No   Rehab Therapy   Yes  No   Acute Rehab   Yes  No   Subacute Rehab   Yes  No   Private  Care   Yes  No   Substance Abuse Referral   Yes  No   Transportation   Yes  No   Chore Service   Yes  No   Inpatient Hospice   Yes  No   OP RT   Yes   No   OP Hemo   Yes   No   OP PT   Yes  No   Support Group   Yes  No   Reach to Recovery   Yes  No   OP Oncology Clinic   Yes  No   Clinic Appointment   Yes  No   DME   Yes  No   Comments    Name of D/C Planner or Social Worker Given to Patient or Family Deatra Robinson, RN   Phone Number [phone]   Date Jul 10, 2016   Time 241 pm   If you are discharged home, whom do you designate to participate in your discharge plan and receive any information needed?     Enter name of [place]  spouse        Phone # of designee [phone]        Address of designee [address] [place] [id number]        Updated Jul 10, 2016        Patient refused to designate any           individual

## 2016-07-10 NOTE — Progress Notes (Signed)
2239 Received patient from OR. Patient is alert and oriented x 4.  0000 Patient Told this nurse that she desires to sleep, Informed about every 2 hour checks.   0400 Patient awake stating that pain level requires PRN medication, Ambulated for 125 feet. Tolerated well.  0645 D/C'd foley and removed vaginal packing with Lacretia Leigh, BSN. Patient tolerated well No spotting noted after.   0711 Bedside shift change report given to Shella Maxim, RN (oncoming nurse) by Stefanie Libel, RN (offgoing nurse). Report included the following information SBAR, Kardex, Procedure Summary, Intake/Output, MAR and Recent Results.

## 2016-07-10 NOTE — Addendum Note (Signed)
Addendum  created 07/10/16 1542 by Cicero Duck, MD    Anesthesia Attestations filed

## 2016-07-10 NOTE — Progress Notes (Signed)
I have reviewed discharge instructions with the patient.  The patient verbalized understanding. Computer signature pad did not record signatures.

## 2016-07-10 NOTE — Telephone Encounter (Signed)
Pt called in to request new medication .pt states medication provided is not working HYDROmorphone (DILAUDID) tablet 2 mg please call pt [phone]

## 2016-07-10 NOTE — Discharge Summary (Signed)
Discharge Summary     Patient: Cassandra Daniels MRN: [id number]  SSN: [id number]    Date of Birth: August 11, 1961  Age: 56 y.o.  Sex: female      Admit Date: 07/09/2016    Discharge Date: 07/10/2016     * Admission Diagnoses:  n81.6 rectocele grade 3;Rectocele     * Discharge Diagnoses:   Hospital Problems as of 07/10/2016  Date Reviewed: 06-23-2016          Codes Class Noted - Resolved POA    Rectocele ICD-10-CM: N81.6  ICD-9-CM: 618.04  07/09/2016 - Present Unknown               * Procedures for this admission: Rectocele repair  Procedure(s):  vaginal rectocele repair,  perineorrhaphy, CYSTOCELE REPAIR      * Disposition: Home    * Discharged Condition: improved    * Hospital Course:  Uneventful postop course.  Ambulating and tolerating diet.  Patient voices no concerns regarding her being able to care for herself after discharge.    Discharge Medications:   Current Discharge Medication List      START taking these medications    Details   HYDROmorphone (DILAUDID) 2 mg tablet Take 1 Tab by mouth every four (4) hours as needed for Pain. Max Daily Amount: 12 mg.  Qty: 24 Tab, Refills: 0      trimethoprim-sulfamethoxazole (BACTRIM DS, SEPTRA DS) 160-800 mg per tablet Take 1 Tab by mouth two (2) times a day for 5 days.  Qty: 10 Tab, Refills: 0      docusate sodium (COLACE) 100 mg capsule Take 1 Cap by mouth two (2) times a day for 30 days.  Qty: 60 Cap, Refills: 0         CONTINUE these medications which have NOT CHANGED    Details   acetaminophen (TYLENOL) 325 mg tablet Take  by mouth every four (4) hours as needed for Pain.      traMADol (ULTRAM-ER) 100 mg Tb24 Take 100 mg by mouth daily.      venlafaxine-SR (EFFEXOR-XR) 150 mg capsule Take 150 mg by mouth daily.      zolpidem CR (AMBIEN CR) 12.5 mg tablet Take 12.5 mg by mouth nightly as needed.  Refills: 2      primidone (MYSOLINE) 50 mg tablet Take 50 mg by mouth daily.      cyclobenzaprine (FLEXERIL) 10 mg tablet Take  by mouth three (3) times  daily as needed for Muscle Spasm(s).      HYDROcodone-acetaminophen (NORCO) 7.5-325 mg per tablet Take 1 Tab by mouth two (2) times daily as needed for Pain for up to 30 days. Max Daily Amount: 2 Tabs. Indications: Pain  Qty: 60 Tab, Refills: 0      diclofenac (VOLTAREN) 1 % gel Apply  to affected area as needed.      ferrous sulfate (IRON) 325 mg (65 mg iron) tablet Take 325 mg by mouth every seven (7) days.      hyoscyamine SL (LEVSIN/SL) 0.125 mg SL tablet Take 1 Tab by mouth every six (6) hours as needed for Cramping.  Qty: 30 Tab, Refills: 3      CYANOCOBALAMIN, VITAMIN B-12, (VITAMIN B-12 PO) Take 1 Tab by mouth every seven (7) days.      clonazePAM (KLONOPIN) 0.25 mg disintegrating tablet Take 0.5 mg by mouth three (3) times daily.              * Follow-up Care/Patient Instructions:  Activity: No lifting, Driving, or Strenuous exercise for 6 weeks  Diet: Regular Diet  Wound Care: Ice to area for comfort    Follow-up Information     Follow up With Details [address]  [address]  [place] [address]  [phone]             Signed By: Mal Amabile, MD     July 10, 2016

## 2016-07-10 NOTE — Progress Notes (Addendum)
[place] assume primary care of patient     1057 dilaudid given, patient report small amount of spotting red colored, patient in bed, no acute distress noted

## 2016-07-10 NOTE — Progress Notes (Signed)
Patient has designated her spouse to participate in his/her discharge plan and to receive any needed information.     Name:   Cassandra Daniels  spouse   [id number]   [address] [place] [id number]   Jul 10, 2016     Address:  Phone number:

## 2016-07-10 NOTE — Op Note (Signed)
[place]    Name:  Cassandra Daniels, SCHALLERT  MR#:  [id number]  DOB:  Aug 21, 1960  Account #:  [id number]  Date of Adm:  07/09/2016  Date of Surgery:  07/09/2016      SURGEON: Elvin So, MD    PREOPERATIVE DIAGNOSIS: Status post hysterectomy and  rectocele, grade 3.    POSTOPERATIVE DIAGNOSIS: Status post hysterectomy and  rectocele, grade 3.    PROCEDURES PERFORMED: Examination under anesthesia, vaginal  rectocele repair (posterior colporrhaphy).    ANESTHESIA: General.    ANTIBIOTICS: Ancef.    COMPLICATIONS: None.    DRAINS: A 16-French Foley catheter and vaginal packing.    ESTIMATED BLOOD LOSS: 40 mL.    SPECIMENS REMOVED: None.    FINDINGS: Post-hysterectomy, grade 3 rectocele with severe  rectovaginal fascial defect.    INDICATIONS: This is a 55 year old female who has had a history of  hysterectomy and also an anterior vaginal repair who presents with  rectocele and a rectal bulge and symptomatic with constipation. Risks,  benefits, alternatives discussed with the patient, consent was obtained.  Decision was made to proceed with rectocele repair.    DESCRIPTION OF PROCEDURE: After consent was obtained, the  patient was identified, brought into the operating room and placed in a  supine position.    Anesthesia was administered. She received a dose of intravenous  antibiotics. She was then put in dorsal lithotomy position. All pressure  points were properly padded. The genital area was prepped and  draped in normal sterile fashion.    A timeout was conducted, identifying the patient, procedure.    Under anesthesia, the vaginal and rectal area were carefully  examined. There was no cystocele appreciated at this point. There  was a roughly 7 cm vaginal length with some scarring on the apex.  There was a grade 3, bordering grade 4 rectocele seen with  anesthesia under relaxation. Rectal exam appeared to have a defect.   Superiorly, there was no mass appreciated in the rectum. The  midportion of the rectocele was then grasped with 2 Allis clamps and it  was hydrodistended using a mixture of 1% lidocaine and 0.5%  Marcaine. I then made a midline incision 6 cm right over the rectocele.  Careful dissection raised a vaginal flap, separating the rectovaginal  fascia from the vaginal epithelium. This was performed with blunt and  sharp dissection using Metzenbaum scissors. There appeared to be a  very large rectovaginal fascial defect centrally. We then copiously  irrigated the wound. There was no enterocele appreciated. The defect  was then closed in several plicating suture in running fashion using 2-0  Vicryl. We started at the apex of the incision and plicated it and the  rectovaginal fascia in rows. A second row of plication was performed.  The rectocele was completely reduced at this point. There was an  excess posterior vaginal epithelium that was then trimmed with a  curved Mayo scissors. The vaginal epithelium was then approximated  in an interrupted fashion using a 2-0 Vicryl. The wound was copiously  irrigated and a 16-French Foley catheter was placed that drained the  bladder, filled with 10 mL in the balloon. The exposure equipment used  during the procedure was a Acupuncturist. At this point, the  retractor was removed and a vaginal packing dipped in Premarin  cream was placed in the vagina. The patient was awakened from  anesthesia and transferred to PACU in stable condition.  Elvin So, MD    AO / CD  D:  07/09/2016   16:54  T:  07/10/2016   05:36  Job #:  [id number]

## 2016-07-10 NOTE — Progress Notes (Signed)
Patient and/or next of kin has been given the ???Outpatient Observation Information and Notification??? letter and all questions answered.

## 2016-07-11 NOTE — Telephone Encounter (Signed)
Patient called in stating she is experiencing a lot of pain, pain medication prescribed isn't helping at all. She also states she hasn't had a bowel movement since surgery, even though she has been taking Colace BID. Informed patient a message will be sent to MD, she will receive a call once I speak with him.

## 2016-07-13 NOTE — Telephone Encounter (Signed)
Called pt. Asked to double up on dilaudid. If no improvement, she can take norco she has at home for chronic pain.  Remain on stool softner

## 2016-07-14 MED FILL — KETOROLAC TROMETHAMINE 30 MG/ML INJECTION: 30 mg/mL (1 mL) | INTRAMUSCULAR | Qty: 1

## 2016-07-14 MED FILL — LIDOCAINE (PF) 20 MG/ML (2 %) IJ SOLN: 20 mg/mL (2 %) | INTRAMUSCULAR | Qty: 5

## 2016-07-14 MED FILL — DEXAMETHASONE SODIUM PHOSPHATE 4 MG/ML IJ SOLN: 4 mg/mL | INTRAMUSCULAR | Qty: 1

## 2016-07-14 MED FILL — LACTATED RINGERS IV: INTRAVENOUS | Qty: 1000

## 2016-07-14 MED FILL — PROPOFOL 10 MG/ML IV EMUL: 10 mg/mL | INTRAVENOUS | Qty: 180

## 2016-07-14 MED FILL — ONDANSETRON (PF) 4 MG/2 ML INJECTION: 4 mg/2 mL | INTRAMUSCULAR | Qty: 2

## 2016-07-17 NOTE — Telephone Encounter (Signed)
The pt called the office to disclose information about her recent surgery. I called and spoke to the pt. She was given a prescription for dilaudid 2 mg. She got it filled and took some but they did not help her pain. She went back to her surgeon and she just went back to taking her norco that she receives from our office. The pt was asked if she is taking the medication as prescribed and she states that she takes the norco very sparingly. Pt has an upcoming appt in January and has no questions or concerns at this time. No new meds to report to provider at this time.

## 2016-07-25 NOTE — Telephone Encounter (Signed)
Spoke with patient she has been informed Per Dr. Jolaine Artist she is to D/C colace and follow up with her PCP. She's to follow up as scheduled with Dr. Jolaine Artist for post-op.

## 2016-07-25 NOTE — Telephone Encounter (Signed)
Pt states had procedure done on 07/10/16 by Dr. Burnell Blanks and since Monday 07/21/16 pt states she has been feeling bad pt states her current symptoms are nausea , diaherra , skin clamy, bad headaches, pt states would like a call to see if these are some effects from the procedure , please contact pt back @ [phone] thanks

## 2016-07-25 NOTE — Telephone Encounter (Signed)
Patient called a second time and was transferred to [facility] per her insistent request

## 2016-08-05 NOTE — Telephone Encounter (Signed)
Patient requesting medications be written early so that she doesn't have to pay deductable starting in Jan. 2018.Left message can not write prescriptions early for that reason. Patient must come in office to be seen before prescriptions can be written.

## 2016-08-07 ENCOUNTER — Ambulatory Visit
Admit: 2016-08-07 | Discharge: 2016-08-07 | Payer: PRIVATE HEALTH INSURANCE | Attending: Urology | Primary: Family Medicine

## 2016-08-07 DIAGNOSIS — N816 Rectocele: Secondary | ICD-10-CM

## 2016-08-07 LAB — AMB POC URINALYSIS DIP STICK AUTO W/O MICRO
Bilirubin (UA POC): NEGATIVE
Glucose (UA POC): NEGATIVE
Ketones (UA POC): NEGATIVE
Nitrites (UA POC): NEGATIVE
Protein (UA POC): NEGATIVE
Specific gravity (UA POC): 1.015 (ref 1.001–1.035)
Urobilinogen (UA POC): 0.2 (ref 0.2–1)
pH (UA POC): 60 — AB (ref 4.6–8.0)

## 2016-08-07 NOTE — Progress Notes (Signed)
Cassandra Daniels  [birth year]          ICD-10-CM ICD-9-CM    1. Rectocele N81.6 618.04 AMB POC URINALYSIS DIP STICK AUTO W/O MICRO      benzonatate (TESSALON) 100 mg capsule      traMADol (ULTRAM) 50 mg tablet      azithromycin (ZITHROMAX) 500 mg tab      URINE C&S   2. Post-operative state Z98.890 V45.89    3. Diarrhea, unspecified type R19.7 787.91        Assessment and Plan:  UA today 1+ blood, 1+ leuks    1. Grade 3 rectocele / Postop State       S/p vaginal rectocele repair (posterior colporrhaphy) 07/09/2016   On exam today - Posterior vaginal incision healing well    Advised to avoid intercourse for another 2-3 weeks, use lubrication   Will send urine for culture      2. Diarrhea (Likely exacerbated with Abx periop/postop)   Advised to use FiberCon or Metamucil. Increase fiber in diet.    If no improvement, f/u with GI    RTO in 6 months for reassessment     Patient's BMI is out of the normal parameters.  Information about BMI was given to the patient.      Chief Complaint   Patient presents with   ??? Cystocele   ??? Rectocele       History of Present Illness:  Cassandra Daniels is a 55 y.o. female G3P3 presents today to further discuss management options for her known bladder prolapse. She is also with urgency, frequency and PFD. Last visit patient reported increasing severity in vaginal prolapse, reported sensations of stool " stuck in the bulge", maneuvers body to release bowels. She is S/p vaginal rectocele repair (posterior colporrhaphy) 07/09/2016.    Patient is doing well post op today   Reports some mild discomfort with walking for long periods of time  Reports having diarrhea over the interim  Reports some mild dribbling since procedure   Nocturia x 1-2    No gross hematuria , dysuria. No f/n/v/c.   Denies pelvic pain.  Denies vaginal discharge.  Denies vaginal tearing with intercourse.     Baseline Urinary sx's:  +Urinary urgency and frequency.   Good FOS  Denies straining to void.    Denies sensations of incomplete bladder emptying.   Asymptomatic for UTI with no significant h/o UTI's.     Episodes of fecal urgency.   Denies chronic constipation.  Normal BM's    Denies neurological issues.   Denies pelvic or back issues.   Denies prior kidney or bladder surgeries.  Denies prior history of nephrolithiasis.    S/p post menopausal posthysterectomy at age 83  S/p cholecystectomy & gastric bypass.     Past Medical History:   Diagnosis Date   ??? Arthritis    ??? Atherosclerotic cardiovascular disease    ??? Bilateral knee pain    ??? Broken nose 1999    surgery   ??? Carpal tunnel syndrome    ??? Chronic fatigue    ??? Fibrocystic breast     bilateral    ??? Gastric bypass status for obesity 2009   ??? Left groin pain    ??? Left hip pain    ??? Meningioma nos    ??? MVP (mitral valve prolapse)    ??? Pancreatitis 2014   ??? Paroxysmal atrial tachycardia ([place])    ??? Paroxysmal tachycardia ([place])    ??? PVC (premature  ventricular contraction)    ??? Right hip pain    ??? Right shoulder pain    ??? Unspecified adverse effect of anesthesia     difficulty waking up    ??? Unspecified sleep apnea     does not use cpap   ??? Vitamin B 12 deficiency        Past Surgical History:   Procedure Laterality Date   ??? ABLATION OF SVT  2007    by Dr. Serita Sheller   ??? HX BLADDER REPAIR  1996   ??? HX CHOLECYSTECTOMY  2014   ??? HX GASTRIC BYPASS  11/07   ??? HX HYSTERECTOMY     ??? HX KNEE ARTHROSCOPY      x5   ??? HX RHINOPLASTY     ??? HX SHOULDER ARTHROSCOPY     ??? MAM BIOPSY BREAST STEREOTACTIC  april, 2013    right, benign       Social History   Substance Use Topics   ??? Smoking status: Never Smoker   ??? Smokeless tobacco: Never Used   ??? Alcohol use No       Allergies   Allergen Reactions   ??? Beta Blocker [Beta-Blockers (Beta-Adrenergic Blocking Agts)] Other (comments)     Fatigue   ??? Tetracycline Hives       Family History   Problem Relation Age of Onset   ??? Asthma Mother    ??? Breast Cancer Mother 72   ??? Cancer Mother    ??? Other Father      cabg   ??? Heart Disease Father     ??? High Cholesterol Father    ??? Heart Attack Maternal Grandmother 61   ??? Breast Cancer Maternal Grandmother 31       Current Outpatient Prescriptions   Medication Sig Dispense Refill   ??? benzonatate (TESSALON) 100 mg capsule TK ONE C PO TID FOR 10 DAYS.  0   ??? traMADol (ULTRAM) 50 mg tablet TK 1 TO 2 TS PO QD PRN  2   ??? azithromycin (ZITHROMAX) 500 mg tab TK 1 T PO D WF FOR 5 DAYS  0   ??? HYDROmorphone (DILAUDID) 2 mg tablet Take 1 Tab by mouth every four (4) hours as needed for Pain. Max Daily Amount: 12 mg. 20 Tab 0   ??? acetaminophen (TYLENOL) 325 mg tablet Take  by mouth every four (4) hours as needed for Pain.     ??? docusate sodium (COLACE) 100 mg capsule Take 1 Cap by mouth two (2) times a day for 30 days. 60 Cap 0   ??? traMADol (ULTRAM-ER) 100 mg Tb24 Take 100 mg by mouth daily.     ??? venlafaxine-SR (EFFEXOR-XR) 150 mg capsule Take 150 mg by mouth daily.     ??? zolpidem CR (AMBIEN CR) 12.5 mg tablet Take 12.5 mg by mouth nightly as needed.  2   ??? clonazePAM (KLONOPIN) 0.25 mg disintegrating tablet Take 0.5 mg by mouth three (3) times daily.     ??? primidone (MYSOLINE) 50 mg tablet Take 50 mg by mouth daily.     ??? cyclobenzaprine (FLEXERIL) 10 mg tablet Take  by mouth three (3) times daily as needed for Muscle Spasm(s).     ??? HYDROcodone-acetaminophen (NORCO) 7.5-325 mg per tablet Take 1 Tab by mouth two (2) times daily as needed for Pain for up to 30 days. Max Daily Amount: 2 Tabs. Indications: Pain 60 Tab 0   ??? diclofenac (VOLTAREN) 1 % gel Apply  to affected area as needed.     ??? ferrous sulfate (IRON) 325 mg (65 mg iron) tablet Take 325 mg by mouth every seven (7) days.     ??? hyoscyamine SL (LEVSIN/SL) 0.125 mg SL tablet Take 1 Tab by mouth every six (6) hours as needed for Cramping. 30 Tab 3   ??? CYANOCOBALAMIN, VITAMIN B-12, (VITAMIN B-12 PO) Take 1 Tab by mouth every seven (7) days.             Review of Systems  Constitutional: Fever: No  Skin: Rash: No  HEENT: Hearing difficulty: No   Eyes: Blurred vision: No  Cardiovascular: Chest pain: No  Respiratory: Shortness of breath: No  Gastrointestinal: Nausea/vomiting: No  Musculoskeletal: Back pain: No  Neurological: Weakness: No  Psychological: Memory loss: No  Comments/additional findings:     PHYSICAL EXAMINATION:     Constitutional: Well developed, well-nourished female in no acute distress.   CV:  No peripheral swelling noted  Respiratory: No respiratory distress or difficulties  Abdomen:  Soft and nontender. No masses. No hepatosplenomegaly, No CVA tenderness, no Suprapubic tenderness.  GU Female:    Urethral Hypermobility: YES  Prior Hysterectomy: YES  Pelvic organ prolapse: NO  Posterior vaginal incision healing well. No rectocele.      Skin:  Normal color. No evidence of jaundice.     Neuro/Psych:  Patient with appropriate affect.  Alert and oriented.        REVIEW OF LABS AND IMAGING:      Results for orders placed or performed in visit on 08/07/16   AMB POC URINALYSIS DIP STICK AUTO W/O MICRO   Result Value Ref Range    Color (UA POC) Yellow     Clarity (UA POC) Clear     Glucose (UA POC) Negative Negative    Bilirubin (UA POC) Negative Negative    Ketones (UA POC) Negative Negative    Specific gravity (UA POC) 1.015 1.001 - 1.035    Blood (UA POC) 1+ Negative    pH (UA POC) 60 (A) 4.6 - 8.0    Protein (UA POC) Negative Negative    Urobilinogen (UA POC) 0.2 mg/dL 0.2 - 1    Nitrites (UA POC) Negative Negative    Leukocyte esterase (UA POC) 1+ Negative         A copy of today's office visit with all pertinent imaging results and labs were sent to the referring physician,JEANNINE York Cerise, MD    Elvin So, MD on 08/07/2016     This chart was documented with the assistance of Serina Cowper, medical scribe for Elvin So, MD on 08/07/2016.

## 2016-08-09 LAB — URINE C&S

## 2016-08-10 NOTE — Progress Notes (Signed)
Will monitor of abx  Currently having diarrhea since procedure.   50K CFU

## 2016-08-18 ENCOUNTER — Ambulatory Visit: Attending: Physician Assistant | Primary: Family Medicine

## 2016-08-18 ENCOUNTER — Ambulatory Visit
Admit: 2016-08-18 | Discharge: 2016-08-18 | Payer: PRIVATE HEALTH INSURANCE | Attending: Physician Assistant | Primary: Family Medicine

## 2016-08-18 DIAGNOSIS — M25551 Pain in right hip: Secondary | ICD-10-CM

## 2016-08-18 MED ORDER — HYDROCODONE-ACETAMINOPHEN 7.5 MG-325 MG TAB
ORAL_TABLET | Freq: Two times a day (BID) | ORAL | 0 refills | Status: AC | PRN
Start: 2016-08-18 — End: 2016-10-17

## 2016-08-18 MED ORDER — TRAMADOL 50 MG TAB
50 mg | ORAL_TABLET | ORAL | 2 refills | Status: DC
Start: 2016-08-18 — End: 2016-11-24

## 2016-08-18 MED ORDER — HYDROCODONE-ACETAMINOPHEN 7.5 MG-325 MG TAB
ORAL_TABLET | Freq: Two times a day (BID) | ORAL | 0 refills | Status: AC | PRN
Start: 2016-08-18 — End: 2016-09-17

## 2016-08-18 MED ORDER — HYDROCODONE-ACETAMINOPHEN 7.5 MG-325 MG TAB
ORAL_TABLET | Freq: Two times a day (BID) | ORAL | 0 refills | Status: DC | PRN
Start: 2016-08-18 — End: 2016-11-24

## 2016-08-18 MED ORDER — ZOLPIDEM SR 12.5 MG MULTIPHASE TAB
12.5 mg | ORAL_TABLET | Freq: Every evening | ORAL | 2 refills | Status: AC | PRN
Start: 2016-08-18 — End: 2016-09-17

## 2016-08-18 NOTE — Patient Instructions (Signed)
1. Continue current plan with no evidence of addiction or diversion. Stable on current medication without adverse events.    2. Refill hydrocodone 7.5/325 mg up to 2 times daily as needed.  3. Refill tramadol 50 mg 1???2 tablets daily as needed.  2 refills  4. Refill Ambien CR 12.5 mg once nightly as needed for sleep.  2 refills  5. Naloxone 4 mg nasal spray for opioid induced respiratory depression emergency only.    6. Discussed risks of addiction, dependency, and opioid induced hyperalgesia.   7. Return to clinic in 3 months

## 2016-08-18 NOTE — Progress Notes (Signed)
HISTORY OF PRESENT ILLNESS  Cassandra Daniels is a 56 y.o. female    HPI: Cassandra Daniels  returns today for f/u of chronic, severe pain involving the right wrist and in particular the first carpometacarpal joint of the right hand, bilateral hip pain,  as well as right knee pain. She also suffers from right carpal and cubital tunnels.     Today is my first visit with Cassandra Daniels.  She continues with pain unchanged since last visit.  She underwent surgery on November 29 to repair rectocele.  She did receive Dilaudid which was disclosed to our practice.  She says this medication did not work well and she just went to back to use her hydrocodone as previously prescribed.  She has been doing well with her current treatment plan which has been offering significant pain control.  She was previously receiving Klonopin from our office.  I have asked her to discuss this further with her PCP for further recommendation/refill.  We had a lengthy conversation about the departure of her physician, Dr. Justice Rocher, who has recently left our practice.  We will continue with her current treatment plan with no changes today.  We will plan to continue with her knee cortisone injections and she will continue with her wrist injections with her orthopedic surgeon.  I will have her follow-up in 3 months or sooner if needed.       Medications are helping with pain control and quality of life. Her pain is 2-3/10 with medication and 8/10 without.  Pt describes pain as stabbing and aching. Aggravating factors include standing, bending, walking, driving, and cooking. Relieved with rest, medication, and avoiding painful activities. Current treatment is helping to improve general activity, mood, walking, sleep, enjoyment of life    Cassandra Daniels is tolerating medications well, with no side effects noted. She is able to stay more active with less discomfort with these current doses. The patient reports an average of 70% pain relief with current  treatment/medications.  Pill counts are appropriate. She is informed of side effects, risks, and benefits of this regimen, and emphasizes that she derives a significant improvement in functionality and quality of life, and notes that non-opioid medications and therapies in the past have not offered significant benefit.    She  is otherwise doing well with no other complaints today. She denies any adverse events including nausea, vomiting, dizziness, increased constipation, hallucinations, or seizures.     Because the patient's current regimen places him/her at increased risk for possible overdose, a prescription for naloxone nasal spray has been provided.  The patient understands that this medication is only to be used in the setting of a possible overdose and that inadvertent use of this medication could precipitate overt withdrawal.         Allergies   Allergen Reactions   ??? Beta Blocker [Beta-Blockers (Beta-Adrenergic Blocking Agts)] Other (comments)     Fatigue   ??? Tetracycline Hives       Past Surgical History:   Procedure Laterality Date   ??? ABLATION OF SVT  2007    by Dr. Serita Sheller   ??? HX BLADDER REPAIR  1996   ??? HX CHOLECYSTECTOMY  2014   ??? HX GASTRIC BYPASS  11/07   ??? HX HYSTERECTOMY     ??? HX KNEE ARTHROSCOPY      x5   ??? HX RHINOPLASTY     ??? HX SHOULDER ARTHROSCOPY     ??? MAM BIOPSY BREAST STEREOTACTIC  april,  2013    right, benign         Review of Systems   Constitutional: Negative for chills and fever.   HENT: Negative for congestion and sore throat.    Eyes: Negative for blurred vision and double vision.   Respiratory: Negative for cough, shortness of breath and wheezing.    Cardiovascular: Positive for palpitations. Negative for chest pain.        PVC's   Gastrointestinal: Negative for abdominal pain, constipation, diarrhea, heartburn, nausea and vomiting.   Genitourinary: Negative.    Musculoskeletal: Positive for back pain and joint pain. Negative for neck pain.    Neurological: Negative for dizziness, seizures and loss of consciousness.   Endo/Heme/Allergies: Does not bruise/bleed easily.   Psychiatric/Behavioral: Negative for depression. The patient has insomnia. The patient is not nervous/anxious.        Physical Exam   Constitutional: She is oriented to person, place, and time and well-developed, well-nourished, and in no distress. No distress.   HENT:   Head: Normocephalic and atraumatic.   Eyes: EOM are normal.   Pulmonary/Chest: Effort normal.   Neurological: She is alert and oriented to person, place, and time.   Skin: Skin is warm and dry. No rash noted. She is not diaphoretic. No erythema.   Psychiatric: Mood, memory, affect and judgment normal.   Nursing note and vitals reviewed.      ASSESSMENT:    1. Bilateral hip pain    2. Primary osteoarthritis of first carpometacarpal joint of right hand    3. Cubital tunnel syndrome on right    4. Carpal tunnel syndrome of right wrist    5. Chronic pain of right knee    6. Primary osteoarthritis of right knee    7. Insomnia, unspecified type             [place] Prescription Monitoring Program was reviewed which does not demonstrate aberrancies and/or inconsistencies with regard to the historical prescribing of controlled medications to this patient by other providers.  NOTE: Disclosed Dilaudid due to recent surgery 07/17/2016    PLAN / Pt Instructions:  1. Continue current plan with no evidence of addiction or diversion. Stable on current medication without adverse events.    2. Refill hydrocodone 7.5/325 mg up to 2 times daily as needed.  3. Refill tramadol 50 mg 1???2 tablets daily as needed.  2 refills  4. Refill Ambien CR 12.5 mg once nightly as needed for sleep.  2 refills  5. Naloxone 4 mg nasal spray for opioid induced respiratory depression emergency only.    6. Discussed risks of addiction, dependency, and opioid induced hyperalgesia.   7. Return to clinic in 3 months    Medications Ordered Today   Medications    ??? HYDROcodone-acetaminophen (NORCO) 7.5-325 mg per tablet     Sig: Take 1 Tab by mouth two (2) times daily as needed for Pain for up to 30 days. Max Daily Amount: 2 Tabs. Indications: Pain     Dispense:  60 Tab     Refill:  0   ??? HYDROcodone-acetaminophen (NORCO) 7.5-325 mg per tablet     Sig: Take 1 Tab by mouth two (2) times daily as needed for Pain for up to 30 days. Max Daily Amount: 2 Tabs. Indications: Pain     Dispense:  60 Tab     Refill:  0   ??? HYDROcodone-acetaminophen (NORCO) 7.5-325 mg per tablet     Sig: Take 1 Tab by mouth two (  2) times daily as needed for Pain for up to 30 days. Max Daily Amount: 2 Tabs. Indications: Pain     Dispense:  60 Tab     Refill:  0   ??? traMADol (ULTRAM) 50 mg tablet     Sig: TK 1 TO 2 TS PO QD PRN     Dispense:  60 Tab     Refill:  2   ??? zolpidem CR (AMBIEN CR) 12.5 mg tablet     Sig: Take 1 Tab by mouth nightly as needed for up to 30 days. Max Daily Amount: 12.5 mg.     Dispense:  30 Tab     Refill:  2           DISPOSITION    ? Pain medications are prescribed with the objective of pain relief and improved physical and psychosocial function in this patient.   ? Pain Meds and Quality Of Life have been reviewed. Nonpharmacologic therapy and non-opioid pharmacologic therapy have been considered. Opioid therapy is only prescribed if the expected benefits are anticipated to outweigh risks.  ? Counseled patient on proper use of prescribed medications.  ? Reviewed with patient self-help tools, home exercise, and lifestyle changes to assist the patient in self-management of symptoms.  ? Reviewed with patient the treatment plan, goals of treatment plan, and limitations of treatment plan, to include the potential for side effects from medications and procedures.  If side effects occur, it is the responsibility of the patient to inform the clinic so that a change in the treatment plan can be made in a safe manner. The patient is advised that  stopping prescribed medication may cause an increase in symptoms and possible medication withdrawal symptoms. The patient is informed an emergency room evaluation may be necessary if this occurs.      Spent 25 minutes with patient today which more than 50% of that time was spent on counseling and coordination of care.        Farris Has, [place] 08/18/2016        Note: Please excuse any typographical errors. Voice recognition software was used for this note and may cause mistakes.

## 2016-08-18 NOTE — Progress Notes (Signed)
Nursing Notes    Patient presents to the office today in follow-up.   Patient rates her pain at 3/10 on the numerical pain scale.     Reviewed medications with counts as follows:    Rx Date filled Qty Dispensed Pill Count Last Dose Short   Klonopin 0.5 mg 05/15/16 60 29 08/04/16 no   ambien ER 12.5 mg 07/05/16 30 0 Sat pm no   trmadol 50 mg 07/06/16 60 38 sat no   Norco 7.5 mg 07/18/16 60 3 Last week no                    Comments: patient is here today for a follow up appt today she states her pain level today is a 3 today  She states she had a surgery on 11/29 at [facility] and was there for 2 nights. She states labs were taken there at the time of the surgery     POC UDS was not performed in office today    Any new labs or imaging since last appointment? YES labs done at [facility] for the surgery    Have you been to an emergency room (ER) or urgent care clinic since your last visit?  NO            Have you been hospitalized since your last visit? YES   2 nights at [facility]  If yes, where, when, and reason for visit?     Have you seen or consulted any other health care providers outside of the [facility]  since your last visit?  YES  PCM   If yes, where, when, and reason for visit?     Cassandra Daniels has a reminder for a "due or due soon" health maintenance. I have asked that she contact her primary care provider for follow-up on this health maintenance.

## 2016-09-11 ENCOUNTER — Encounter: Attending: Orthopaedic Surgery | Primary: Family Medicine

## 2016-09-12 ENCOUNTER — Encounter: Attending: Orthopaedic Surgery | Primary: Family Medicine

## 2016-09-30 NOTE — Telephone Encounter (Signed)
Patient called the front desk and stated that when she takes the [place] it gives her a burning feeling in her chest. I spoke with RC he states that she can taper off of the med and see if the symptoms go away if. If not she need to follow up with her PCM about the pain. The patient was called back and informed of what was told to me by [facility]. I was on hold and was not able to speak with the patient.

## 2016-10-23 ENCOUNTER — Encounter: Attending: Orthopaedic Surgery | Primary: Family Medicine

## 2016-11-07 NOTE — Telephone Encounter (Signed)
Received call from patient stating that she received percocet and norco post surgery.

## 2016-11-10 NOTE — Telephone Encounter (Signed)
OK, thank you (Routing comment) /rc

## 2016-11-24 ENCOUNTER — Ambulatory Visit
Admit: 2016-11-24 | Discharge: 2016-11-24 | Payer: PRIVATE HEALTH INSURANCE | Attending: Physician Assistant | Primary: Family Medicine

## 2016-11-24 DIAGNOSIS — M25551 Pain in right hip: Secondary | ICD-10-CM

## 2016-11-24 LAB — AMB POC DRUG SCREEN (G0477)
AMPHETAMINES UR POC: POSITIVE
BENZODIAZEPINES UR POC: NEGATIVE
BUPRENORPHINE UR POC: NEGATIVE
CANNABINOIDS UR POC: NEGATIVE
COCAINE UR POC: NEGATIVE
METHADONE UR POC: NEGATIVE
OPIATES UR POC: POSITIVE
OXYCODONE UR POC: POSITIVE

## 2016-11-24 MED ORDER — HYDROCODONE-ACETAMINOPHEN 7.5 MG-325 MG TAB
ORAL_TABLET | Freq: Two times a day (BID) | ORAL | 0 refills | Status: DC | PRN
Start: 2016-11-24 — End: 2016-11-24

## 2016-11-24 MED ORDER — TRAMADOL 50 MG TAB
50 mg | ORAL_TABLET | ORAL | 2 refills | Status: DC
Start: 2016-11-24 — End: 2017-02-03

## 2016-11-24 NOTE — ACP (Advance Care Planning) (Signed)
Patient has an acp and do not have any questions at this time. Patient verbalized understanding.

## 2016-11-24 NOTE — Progress Notes (Signed)
HISTORY OF PRESENT ILLNESS  Cassandra Daniels is a 56 y.o. female    HPI: Cassandra Daniels  returns today for f/u of chronic, severe pain involving the right wrist and in particular the first carpometacarpal joint of the right hand, bilateral hip pain,  as well as right knee pain. She also suffers from right carpal and cubital tunnels.     Cassandra Daniels continues unchanged since last visit.  She recently underwent liposuction procedure.  She received oxycodone which was disclosed to our practice.  She reports that she was only provided medication for 5 days and was told to resume her hydrocodone.  Extensive counseling was provided today.  She reports that because of the situation she ran out of her hydrocodone.  I explained to her today that we do not treat for acute or postsurgical pain and that she cannot self increase her medications as this could lead to discontinuation of her treatment plan.  She is planning to have some more procedures in the near future with her orthopedic surgeon.  We reviewed her plan in detail today and I have reminded her to contact our office within 72 hours if she receives outside controlled substance.  She verbally agrees.  We will continue with her current treatment plan for now.  Unfortunately, she will have to wait till at least 4/22 to fill her hydrocodone.    I will have her follow-up in 3 months for further evaluation and recommendation.       Medications are helping with pain control and quality of life. Her pain is 2-3/10 with medication and 8/10 without.  Pt describes pain as stabbing and aching. Aggravating factors include standing, bending, walking, driving, and cooking. Relieved with rest, medication, and avoiding painful activities. Current treatment is helping to improve general activity, mood, walking, sleep, enjoyment of life    Cassandra Daniels is tolerating medications well, with no side effects noted. She is able to stay more active with less discomfort with these current  doses. The patient reports an average of 70% pain relief with current treatment/medications.  Pill counts are appropriate. She is informed of side effects, risks, and benefits of this regimen, and emphasizes that she derives a significant improvement in functionality and quality of life, and notes that non-opioid medications and therapies in the past have not offered significant benefit.    She  is otherwise doing well with no other complaints today. She denies any adverse events including nausea, vomiting, dizziness, increased constipation, hallucinations, or seizures.     Because the patient's current regimen places him/her at increased risk for possible overdose, a prescription for naloxone nasal spray has been provided.  The patient understands that this medication is only to be used in the setting of a possible overdose and that inadvertent use of this medication could precipitate overt withdrawal.    POC UDS today. Confirmation pending.       Allergies   Allergen Reactions   ??? Beta Blocker [Beta-Blockers (Beta-Adrenergic Blocking Agts)] Other (comments)     Fatigue   ??? Tetracycline Hives       Past Surgical History:   Procedure Laterality Date   ??? ABLATION OF SVT  2007    by Dr. Serita Sheller   ??? HX BLADDER REPAIR  1996   ??? HX CHOLECYSTECTOMY  2014   ??? HX GASTRIC BYPASS  11/07   ??? HX HYSTERECTOMY     ??? HX KNEE ARTHROSCOPY      x5   ??? HX  RHINOPLASTY     ??? HX SHOULDER ARTHROSCOPY     ??? MAM BIOPSY BREAST STEREOTACTIC  april, 2013    right, benign         Review of Systems   Constitutional: Negative for chills and fever.   HENT: Negative for congestion and sore throat.    Eyes: Negative for blurred vision and double vision.   Respiratory: Negative for cough, shortness of breath and wheezing.    Cardiovascular: Positive for palpitations. Negative for chest pain.        PVC's   Gastrointestinal: Negative for abdominal pain, constipation, diarrhea, heartburn, nausea and vomiting.   Genitourinary: Negative.     Musculoskeletal: Positive for back pain and joint pain. Negative for neck pain.   Neurological: Negative for dizziness, seizures and loss of consciousness.   Endo/Heme/Allergies: Does not bruise/bleed easily.   Psychiatric/Behavioral: Negative for depression. The patient has insomnia. The patient is not nervous/anxious.        Physical Exam   Constitutional: She is oriented to person, place, and time and well-developed, well-nourished, and in no distress. No distress.   HENT:   Head: Normocephalic and atraumatic.   Eyes: EOM are normal.   Pulmonary/Chest: Effort normal.   Neurological: She is alert and oriented to person, place, and time.   Skin: Skin is warm and dry. No rash noted. She is not diaphoretic. No erythema.   Psychiatric: Mood, memory, affect and judgment normal.   Nursing note and vitals reviewed.      ASSESSMENT:    1. Bilateral hip pain    2. Encounter for long-term (current) use of high-risk medication    3. Cubital tunnel syndrome on right    4. Carpal tunnel syndrome of right wrist    5. Chronic pain of right knee    6. Primary osteoarthritis of right knee        COMM: [place] Program was reviewed which does not demonstrate aberrancies and/or inconsistencies with regard to the historical prescribing of controlled medications to this patient by other providers.  NOTE: Disclosed Dilaudid due to recent surgery 07/17/2016   Disclosed oxycodone 5/325 mg filled 10/24/2016 for post procedural pain.     PLAN / Pt Instructions:  1. Continue current plan with no evidence of addiction or diversion. Stable on current medication without adverse events.    2. Refill hydrocodone 7.5/325 mg up to 2 times daily as needed.  3. Refill tramadol 50 mg 1???2 tablets daily as needed.  2 refills  4. Continue Ambien CR 12.5 mg.  Please follow tapering plan as discussed.  5. Please remember that we do not treat for acute or postsurgical pain.   Please do not self increase her medications as this could lead to dismissal from your treatment plan.  for future reference,  please call and disclose any outside controlled substance to our office within 72 hours for postsurgical pain.  6. Naloxone 4 mg nasal spray for opioid induced respiratory depression emergency only.    7. Discussed risks of addiction, dependency, and opioid induced hyperalgesia.   8. Return to clinic in 3 months.  Please call and cancel the your appointment as well as your pain management agreement if you do decide to transition her care by this time        Medications Ordered Today   Medications   ??? DISCONTD: HYDROcodone-acetaminophen (NORCO) 7.5-325 mg per tablet     Sig: Take 1 Tab by mouth  two (2) times daily as needed for Pain for up to 30 days. Max Daily Amount: 2 Tabs. Indications: Pain     Dispense:  60 Tab     Refill:  0   ??? traMADol (ULTRAM) 50 mg tablet     Sig: TK 1 TO 2 TS PO QD PRN     Dispense:  60 Tab     Refill:  2           DISPOSITION    ? Pain medications are prescribed with the objective of pain relief and improved physical and psychosocial function in this patient.   ? Pain Meds and Quality Of Life have been reviewed. Nonpharmacologic therapy and non-opioid pharmacologic therapy have been considered. Opioid therapy is only prescribed if the expected benefits are anticipated to outweigh risks.  ? Counseled patient on proper use of prescribed medications.  ? Reviewed with patient self-help tools, home exercise, and lifestyle changes to assist the patient in self-management of symptoms.  ? Reviewed with patient the treatment plan, goals of treatment plan, and limitations of treatment plan, to include the potential for side effects from medications and procedures.  If side effects occur, it is the responsibility of the patient to inform the clinic so that a change in the treatment plan can be made in a safe manner. The patient is advised that  stopping prescribed medication may cause an increase in symptoms and possible medication withdrawal symptoms. The patient is informed an emergency room evaluation may be necessary if this occurs.      Spent 25 minutes with patient today which more than 50% of that time was spent on counseling and coordination of care.        Farris Has, [place] 11/24/2016        Note: Please excuse any typographical errors. Voice recognition software was used for this note and may cause mistakes.

## 2016-11-24 NOTE — Patient Instructions (Signed)
1. Continue current plan with no evidence of addiction or diversion. Stable on current medication without adverse events.    2. Refill hydrocodone 7.5/325 mg up to 2 times daily as needed.  3. Refill tramadol 50 mg 1???2 tablets daily as needed.  2 refills  4. Continue Ambien CR 12.5 mg.  Please follow tapering plan as discussed.  5. Please remember that we do not treat for acute or postsurgical pain.  Please do not self increase her medications as this could lead to dismissal from your treatment plan.  for future reference,  please call and disclose any outside controlled substance to our office within 72 hours for postsurgical pain.  6. Naloxone 4 mg nasal spray for opioid induced respiratory depression emergency only.    7. Discussed risks of addiction, dependency, and opioid induced hyperalgesia.   8. Return to clinic in 3 months.  Please call and cancel the your appointment as well as your pain management agreement if you do decide to transition her care by this time

## 2016-11-24 NOTE — Progress Notes (Signed)
Nursing Notes    Patient presents to the office today in follow-up.   Patient rates her pain at 8/10 on the numerical pain scale.     Reviewed medications with counts as follows:          Comments: Patient is here today for a follow up appt today she states her pain level today is an 8  She states she had a surgery 5 weeks ago and labs were taken at her pcm for a physical     POC UDS was performed in office today per verbal order per [facility]    Any new labs or imaging since last appointment? YES imaging and labs at [facility]     Have you been to an emergency room (ER) or urgent care clinic since your last visit?  NO            Have you been hospitalized since your last visit? NO     If yes, where, when, and reason for visit?     Have you seen or consulted any other health care providers outside of the [facility]  since your last visit?  YES [facility] and pcm     If yes, where, when, and reason for visit?   HM deferred to pcp.

## 2016-12-11 NOTE — Telephone Encounter (Signed)
Patient called that office [place]. Chart review was done. Patient stated that she would need an early refill on her Tramadol 50 mg to be filled 11 days early. Tramadol was last filled on 11/28/16. Provider Sula Rumple PA-C was made aware and stated that would be fine but it would be approved this one time. Patient was made aware and stated that would be fine. Will route conversation to provider.

## 2016-12-12 ENCOUNTER — Encounter: Attending: Orthopaedic Surgery | Primary: Family Medicine

## 2016-12-15 ENCOUNTER — Encounter

## 2017-01-20 ENCOUNTER — Encounter

## 2017-01-20 NOTE — Telephone Encounter (Signed)
Cassandra Daniels has called requesting a refill of their controlled medication, norco 7.5/325mg   , for the management of chronic generalized pain .    Last office visit date: 11/24/16    Date last pmp was pulled and reviewed : 01/20/17    Was the patient compliant when the above report was pulled?  no    Analgesia: 60% pain relief noted;    Aberrancies: patient noted to fill clonazepam on 01/14/17    ADL's: patient is able to complete adl care    Adverse Reaction: patient c/o's reflux with norco     Provider's last note and plan of care reviewed? yes  Request forwarded to provider for review.

## 2017-01-21 MED ORDER — HYDROCODONE-ACETAMINOPHEN 7.5 MG-325 MG TAB
ORAL_TABLET | Freq: Two times a day (BID) | ORAL | 0 refills | Status: DC | PRN
Start: 2017-01-21 — End: 2017-01-30

## 2017-01-29 ENCOUNTER — Encounter: Attending: Urology | Primary: Family Medicine

## 2017-01-30 ENCOUNTER — Ambulatory Visit
Admit: 2017-01-30 | Discharge: 2017-01-30 | Payer: PRIVATE HEALTH INSURANCE | Attending: Urology | Primary: Family Medicine

## 2017-01-30 DIAGNOSIS — N816 Rectocele: Secondary | ICD-10-CM

## 2017-01-30 LAB — AMB POC URINALYSIS DIP STICK AUTO W/O MICRO
Bilirubin (UA POC): NEGATIVE
Glucose (UA POC): NEGATIVE
Ketones (UA POC): NEGATIVE
Leukocyte esterase (UA POC): NEGATIVE
Nitrites (UA POC): NEGATIVE
Protein (UA POC): NEGATIVE
Specific gravity (UA POC): 1.02 (ref 1.001–1.035)
Urobilinogen (UA POC): 0.2 (ref 0.2–1)
pH (UA POC): 5.5 (ref 4.6–8.0)

## 2017-01-30 NOTE — Progress Notes (Signed)
[name]  31-Dec-1960          ICD-10-CM ICD-9-CM    1. Rectocele N81.6 618.04 AMB POC URINALYSIS DIP STICK AUTO W/O MICRO       Assessment and Plan:  UA today: 2+ Blood     1. Grade 3 Rectocele  - s/p vaginal rectocele repair (posterior colporrhaphy) 07/09/2016   GU exam today: Grade 1 rectocele. Incision well-healed.    Advised patient avoid constipation.    Recommended kegel exercises.    Will continue to monitor.     RTO in 1 year to reassess.      BMI 32.92 Patient's BMI is out of the normal parameters.  Information about BMI was given to the patient.      Chief Complaint   Patient presents with   ??? Surgical Follow-up     f/u       History of Present Illness:  Cassandra Daniels is a 56 y.o. female G3P3 presents today in follow up for an established history of Grade 3 rectocele s/p  S/p vaginal rectocele repair (posterior colporrhaphy) 07/09/2016. Patient was last seen in the office postoperatively on 08/07/16 and had complaints of mild vaginal discomfort at that time.     Today, patient notes that she feels sensations of bulge within the vagina  Believes the prolapse may have returned  Patient denies any issues with BMs  Denies any constipation    No recent urine infections.   Notes occasional dyspareunia.   Denies any pelvic pain.     Asymptomatic for infection today.  Denies any gross hematuria or dysuria.  No N/V/F/C.       Baseline Urinary sx's:  +Urinary urgency  +frequency.   +Episodes of fecal urgency.     PMHx:  S/p post menopausal post-hysterectomy at age 43  S/p cholecystectomy & gastric bypass.     Past Medical History:   Diagnosis Date   ??? Arthritis    ??? Atherosclerotic cardiovascular disease    ??? Bilateral knee pain    ??? Broken nose 1999    surgery   ??? Carpal tunnel syndrome    ??? Chronic fatigue    ??? Fibrocystic breast     bilateral    ??? Gastric bypass status for obesity 2009   ??? Left groin pain    ??? Left hip pain    ??? Meningioma nos     ??? MVP (mitral valve prolapse)    ??? Pancreatitis 2014   ??? Paroxysmal atrial tachycardia ([place])    ??? Paroxysmal tachycardia ([place])    ??? PVC (premature ventricular contraction)    ??? Right hip pain    ??? Right shoulder pain    ??? Unspecified adverse effect of anesthesia     difficulty waking up    ??? Unspecified sleep apnea     does not use cpap   ??? Vitamin B 12 deficiency        Past Surgical History:   Procedure Laterality Date   ??? ABLATION OF SVT  2007    by Dr. Serita Sheller   ??? HX BLADDER REPAIR  1996   ??? HX CHOLECYSTECTOMY  2014   ??? HX GASTRIC BYPASS  11/07   ??? HX HYSTERECTOMY     ??? HX KNEE ARTHROSCOPY      x5   ??? HX RHINOPLASTY     ??? HX SHOULDER ARTHROSCOPY     ??? MAM BIOPSY BREAST STEREOTACTIC  april, 2013    right,  benign       Social History   Substance Use Topics   ??? Smoking status: Never Smoker   ??? Smokeless tobacco: Never Used   ??? Alcohol use No       Allergies   Allergen Reactions   ??? Beta Blocker [Beta-Blockers (Beta-Adrenergic Blocking Agts)] Other (comments)     Fatigue   ??? Tetracycline Hives       Family History   Problem Relation Age of Onset   ??? Asthma Mother    ??? Breast Cancer Mother 20   ??? Cancer Mother    ??? Other Father      cabg   ??? Heart Disease Father    ??? High Cholesterol Father    ??? Heart Attack Maternal Grandmother 56   ??? Breast Cancer Maternal Grandmother 75       Current Outpatient Prescriptions   Medication Sig Dispense Refill   ??? cephALEXin (KEFLEX) 500 mg capsule TK ONE C PO QID FOR 3 DAYS.  0   ??? PROMETHEGAN 25 mg suppository INSERT 1 SUPPOSITORY RECTALLY EVERY 6 HOURS FOR NAUSEA AND VOMITING.  0   ??? zolpidem CR (AMBIEN CR) 12.5 mg tablet TK 1 T PO QHS  0   ??? traMADol (ULTRAM) 50 mg tablet TK 1 TO 2 TS PO QD PRN 60 Tab 2   ??? benzonatate (TESSALON) 100 mg capsule TK ONE C PO TID FOR 10 DAYS.  0   ??? azithromycin (ZITHROMAX) 500 mg tab TK 1 T PO D WF FOR 5 DAYS  0   ??? acetaminophen (TYLENOL) 325 mg tablet Take  by mouth every four (4) hours as needed for Pain.      ??? traMADol (ULTRAM-ER) 100 mg Tb24 Take 100 mg by mouth daily.     ??? venlafaxine-SR (EFFEXOR-XR) 150 mg capsule Take 150 mg by mouth daily.     ??? clonazePAM (KLONOPIN) 0.25 mg disintegrating tablet Take 0.5 mg by mouth three (3) times daily.     ??? primidone (MYSOLINE) 50 mg tablet Take 50 mg by mouth daily.     ??? cyclobenzaprine (FLEXERIL) 10 mg tablet Take  by mouth three (3) times daily as needed for Muscle Spasm(s).     ??? diclofenac (VOLTAREN) 1 % gel Apply  to affected area as needed.     ??? ferrous sulfate (IRON) 325 mg (65 mg iron) tablet Take 325 mg by mouth every seven (7) days.     ??? hyoscyamine SL (LEVSIN/SL) 0.125 mg SL tablet Take 1 Tab by mouth every six (6) hours as needed for Cramping. 30 Tab 3   ??? CYANOCOBALAMIN, VITAMIN B-12, (VITAMIN B-12 PO) Take 1 Tab by mouth every seven (7) days.           Review of Systems  Constitutional: Fever: No  Skin: Rash: No  HEENT: Hearing difficulty: No  Eyes: Blurred vision: No  Cardiovascular: Chest pain: No  Respiratory: Shortness of breath: No  Gastrointestinal: Nausea/vomiting: No  Musculoskeletal: Back pain: No  Neurological: Weakness: No  Psychological: Memory loss: No  Comments/additional findings:     PHYSICAL EXAMINATION:     Constitutional: Well developed, well-nourished female in no acute distress.   CV:  No peripheral swelling noted  Respiratory: No respiratory distress or difficulties  Abdomen:  Soft and nontender. No masses. No hepatosplenomegaly, No CVA tenderness, no Suprapubic tenderness.  GU Female:    Grade 1 rectocele. Incision well-healed. No leakage demonstrable    Skin:  Normal color. No  evidence of jaundice.     Neuro/Psych:  Patient with appropriate affect.  Alert and oriented.        REVIEW OF LABS AND IMAGING:      Results for orders placed or performed in visit on 01/30/17   AMB POC URINALYSIS DIP STICK AUTO W/O MICRO   Result Value Ref Range    Color (UA POC) Yellow     Clarity (UA POC) Clear     Glucose (UA POC) Negative Negative     Bilirubin (UA POC) Negative Negative    Ketones (UA POC) Negative Negative    Specific gravity (UA POC) 1.020 1.001 - 1.035    Blood (UA POC) 2+ Negative    pH (UA POC) 5.5 4.6 - 8.0    Protein (UA POC) Negative Negative    Urobilinogen (UA POC) 0.2 mg/dL 0.2 - 1    Nitrites (UA POC) Negative Negative    Leukocyte esterase (UA POC) Negative Negative       A copy of today's office visit with all pertinent imaging results and labs were sent to the referring physician,JEANNINE York Cerise, MD    Elvin So, MD     Medical Documentation is provided with the assistance of Dontranika M. Horton, Medical Scribe for Elvin So, MD on 01/30/2017

## 2017-02-03 ENCOUNTER — Ambulatory Visit
Admit: 2017-02-03 | Discharge: 2017-02-03 | Payer: PRIVATE HEALTH INSURANCE | Attending: Physician Assistant | Primary: Family Medicine

## 2017-02-03 DIAGNOSIS — Z79899 Other long term (current) drug therapy: Secondary | ICD-10-CM

## 2017-02-03 MED ORDER — TRAMADOL 50 MG TAB
50 mg | ORAL_TABLET | ORAL | 0 refills | Status: DC
Start: 2017-02-03 — End: 2017-08-21

## 2017-02-03 MED ORDER — TRAMADOL 50 MG TAB
50 mg | ORAL_TABLET | ORAL | 0 refills | Status: DC
Start: 2017-02-03 — End: 2019-02-28

## 2017-02-03 MED ORDER — HYDROCODONE-ACETAMINOPHEN 7.5 MG-325 MG TAB
ORAL_TABLET | Freq: Two times a day (BID) | ORAL | 0 refills | Status: AC | PRN
Start: 2017-02-03 — End: 2017-05-27

## 2017-02-03 MED ORDER — HYDROCODONE-ACETAMINOPHEN 7.5 MG-325 MG TAB
ORAL_TABLET | Freq: Two times a day (BID) | ORAL | 0 refills | Status: AC | PRN
Start: 2017-02-03 — End: 2017-03-28

## 2017-02-03 MED ORDER — HYDROCODONE-ACETAMINOPHEN 7.5 MG-325 MG TAB
ORAL_TABLET | Freq: Two times a day (BID) | ORAL | 0 refills | Status: AC | PRN
Start: 2017-02-03 — End: 2017-04-27

## 2017-02-03 NOTE — ACP (Advance Care Planning) (Signed)
Patient has an acp and do not have any questions at this time. Patient verbalized understanding.

## 2017-02-03 NOTE — Patient Instructions (Signed)
1. Continue current plan with no evidence of addiction or diversion. Stable on current medication without adverse events.    2. Refill hydrocodone 7.5/325 mg up to 2 times daily as needed.  3. Refill tramadol 50 mg 1???2 tablets daily as needed.  2 refills  4. Continue Ambien CR 12.5 mg.  Please follow tapering plan as discussed.  5. Please remember that we do not treat for acute or postsurgical pain.  please call and disclose any outside controlled substance to our office within 72 hours for postsurgical pain.  6. Naloxone 4 mg nasal spray for opioid induced respiratory depression emergency only.    7. Discussed risks of addiction, dependency, and opioid induced hyperalgesia.  8. Please remember to call at least 4-5 business days prior to your medication refill.   9. Return to clinic in 3 months. Please call and cancel your appointment and pain management agreement if you do decide to transfer your care.

## 2017-02-03 NOTE — Progress Notes (Signed)
Nursing Notes    Patient presents to the office today in follow-up.   Patient rates her pain at 4/10 on the numerical pain scale.     Reviewed medications with counts as follows:    Rx Date filled Qty Dispensed Pill Count Last Dose Short   Tramadol 50 mg 01/24/17 60 34 This am  Yes    Norco 7.5 mg 01/28/17 60 14 Sunday  Yes      :    Comments:  Patient is here today for a follow up appt today she states her pain level today is a 4  Patient states she left her bottle at home for her Norco she states she forgot to bring them because it was a last minute appt   When she was called. PHQ9 was done patient denies any depression   Patient states she is having surgery on 07/13 and will call and inform us of meds she was given   Patient states she left her other meds at home in the pill organizer       POC UDS was not performed in office today    Any new labs or imaging since last appointment? YES xrays were done at [facility]    Have you been to an emergency room (ER) or urgent care clinic since your last visit?  NO            Have you been hospitalized since your last visit? NO     If yes, where, when, and reason for visit?     Have you seen or consulted any other health care providers outside of [facility]  since your last visit?  YES   [facility]  If yes, where, when, and reason for visit?   Cassandra Daniels has a reminder for a "due or due soon" health maintenance. I have asked that she contact her primary care provider for follow-up on this health maintenance.

## 2017-02-03 NOTE — Progress Notes (Signed)
HISTORY OF PRESENT ILLNESS  Cassandra Daniels is a 56 y.o. female    HPI: Cassandra Daniels  returns today for f/u of chronic, severe pain involving the right wrist and in particular the first carpometacarpal joint of the right hand, bilateral hip pain,  as well as right knee pain. She also suffers from right carpal and cubital tunnels.     Cassandra Daniels continues unchanged since last visit.  She continues to do well with her current treatment plan which has been offering significant pain control.  She still planning to undergo surgery to her right hand.  We did reviewed postsurgical plan again today.  We also had a lengthy conversation about changes to our practice.  I explained to her that moving forward we will be focusing on a more conservative and non-opioid plan of care.  I have agreed to continue with her current treatment plan but she understands we will begin tapering her medication next visit.  She is concerned about these changes and states that she will most likely be transferring her care.  I have asked her to schedule appointment 3 months and to call and cancel her appointment and pain management agreement if she does decide to transfer her care.  She has not made a formal decision at this time.  She did not have a bottle of Norco with her today.  Extensive counseling was provided.  Explained her that she must bring her medications and bottles with her to each and every appointment even if her bottles are empty.  * She did return shortly after the visit with her bottles. Pill count was appropriate.      Medications are helping with pain control and quality of life. Her pain is 2-3/10 with medication and 8/10 without.  Pt describes pain as stabbing and aching. Aggravating factors include standing, bending, walking, driving, and cooking. Relieved with rest, medication, and avoiding painful activities. Current treatment is helping to improve general activity, mood, walking, sleep, enjoyment of life     Cassandra Daniels is tolerating medications well, with no side effects noted. She is able to stay more active with less discomfort with these current doses. The patient reports an average of 70% pain relief with current treatment/medications. She is informed of side effects, risks, and benefits of this regimen, and emphasizes that she derives a significant improvement in functionality and quality of life, and notes that non-opioid medications and therapies in the past have not offered significant benefit.    She  is otherwise doing well with no other complaints today. She denies any adverse events including nausea, vomiting, dizziness, increased constipation, hallucinations, or seizures.     Because the patient's current regimen places him/her at increased risk for possible overdose, a prescription for naloxone nasal spray has been provided.  The patient understands that this medication is only to be used in the setting of a possible overdose and that inadvertent use of this medication could precipitate overt withdrawal.    MME: 25  COMM: 4  OSWESTRY: 26 %  ORT: 0  PMA updated: 02/03/2017  Last UDS reviewed         Allergies   Allergen Reactions   ??? Beta Blocker [Beta-Blockers (Beta-Adrenergic Blocking Agts)] Other (comments)     Fatigue   ??? Tetracycline Hives       Past Surgical History:   Procedure Laterality Date   ??? ABLATION OF SVT  2007    by Dr. Serita Sheller   ??? HX BLADDER REPAIR  1996   ??? HX CHOLECYSTECTOMY  2014   ??? HX GASTRIC BYPASS  11/07   ??? HX HYSTERECTOMY     ??? HX KNEE ARTHROSCOPY      x5   ??? HX RHINOPLASTY     ??? HX SHOULDER ARTHROSCOPY     ??? MAM BIOPSY BREAST STEREOTACTIC  april, 2013    right, benign         Review of Systems   Constitutional: Negative for chills and fever.   HENT: Negative for congestion and sore throat.    Eyes: Negative for blurred vision and double vision.   Respiratory: Negative for cough, shortness of breath and wheezing.     Cardiovascular: Positive for palpitations. Negative for chest pain.        PVC's   Gastrointestinal: Negative for abdominal pain, constipation, diarrhea, heartburn, nausea and vomiting.   Genitourinary: Negative.    Musculoskeletal: Positive for back pain and joint pain. Negative for neck pain.   Neurological: Negative for dizziness, seizures and loss of consciousness.   Endo/Heme/Allergies: Does not bruise/bleed easily.   Psychiatric/Behavioral: Negative for depression. The patient has insomnia. The patient is not nervous/anxious.        Physical Exam   Constitutional: She is oriented to person, place, and time and well-developed, well-nourished, and in no distress. No distress.   HENT:   Head: Normocephalic and atraumatic.   Eyes: EOM are normal.   Pulmonary/Chest: Effort normal.   Neurological: She is alert and oriented to person, place, and time.   Skin: Skin is warm and dry. No rash noted. She is not diaphoretic. No erythema.   Psychiatric: Mood, memory, affect and judgment normal.   Nursing note and vitals reviewed.      ASSESSMENT:    1. Encounter for long-term (current) use of high-risk medication    2. Bilateral hip pain    3. Rectocele    4. Chronic pain of right knee    5. Primary osteoarthritis of right knee    6. Cubital tunnel syndrome on right    7. Carpal tunnel syndrome of right wrist         [place] Prescription Monitoring Program was reviewed which does not demonstrate aberrancies and/or inconsistencies with regard to the historical prescribing of controlled medications to this patient by other providers.  NOTE: Disclosed Dilaudid due to recent surgery 07/17/2016   Disclosed oxycodone 5/325 mg filled 10/24/2016 for post procedural pain.     PLAN / Pt Instructions:  1. Continue current plan with no evidence of addiction or diversion. Stable on current medication without adverse events.    2. Refill hydrocodone 7.5/325 mg up to 2 times daily as needed.   3. Refill tramadol 50 mg 1???2 tablets daily as needed.    4. Discontinue Ambien  5. Please remember that we do not treat for acute or postsurgical pain.  please call and disclose any outside controlled substance to our office within 72 hours for postsurgical pain.  6. Naloxone 4 mg nasal spray for opioid induced respiratory depression emergency only.    7. Discussed risks of addiction, dependency, and opioid induced hyperalgesia.  8. Please remember to call at least 4-5 business days prior to your medication refill.   9. Return to clinic in 3 months. Please call and cancel your appointment and pain management agreement if you do decide to transfer your care.      Medications Ordered Today   Medications   ??? HYDROcodone-acetaminophen (NORCO) 7.5-325 mg per  tablet     Sig: Take 1 Tab by mouth two (2) times daily as needed for Pain for up to 30 days. Max Daily Amount: 2 Tabs.     Dispense:  60 Tab     Refill:  0   ??? traMADol (ULTRAM) 50 mg tablet     Sig: TK 1 TO 2 TS PO QD PRN     Dispense:  60 Tab     Refill:  0   ??? HYDROcodone-acetaminophen (NORCO) 7.5-325 mg per tablet     Sig: Take 1 Tab by mouth two (2) times daily as needed for Pain for up to 30 days. Max Daily Amount: 2 Tabs. Indications: Pain     Dispense:  60 Tab     Refill:  0   ??? HYDROcodone-acetaminophen (NORCO) 7.5-325 mg per tablet     Sig: Take 1 Tab by mouth two (2) times daily as needed for Pain for up to 30 days. Max Daily Amount: 2 Tabs. Indications: Pain     Dispense:  60 Tab     Refill:  0   ??? traMADol (ULTRAM) 50 mg tablet     Sig: TK 1 TO 2 TS PO QD PRN     Dispense:  60 Tab     Refill:  0   ??? traMADol (ULTRAM) 50 mg tablet     Sig: TK 1 TO 2 TS PO QD PRN     Dispense:  60 Tab     Refill:  0       DISPOSITION    ? Pain medications are prescribed with the objective of pain relief and improved physical and psychosocial function in this patient.   ? Pain Meds and Quality Of Life have been reviewed. Nonpharmacologic  therapy and non-opioid pharmacologic therapy have been considered. Opioid therapy is only prescribed if the expected benefits are anticipated to outweigh risks.  ? Counseled patient on proper use of prescribed medications.  ? Reviewed with patient self-help tools, home exercise, and lifestyle changes to assist the patient in self-management of symptoms.  ? Reviewed with patient the treatment plan, goals of treatment plan, and limitations of treatment plan, to include the potential for side effects from medications and procedures.  If side effects occur, it is the responsibility of the patient to inform the clinic so that a change in the treatment plan can be made in a safe manner. The patient is advised that stopping prescribed medication may cause an increase in symptoms and possible medication withdrawal symptoms. The patient is informed an emergency room evaluation may be necessary if this occurs.      Spent 25 minutes with patient today which more than 50% of that time was spent on counseling and coordination of care.        Farris Has, [place] 02/03/2017        Note: Please excuse any typographical errors. Voice recognition software was used for this note and may cause mistakes.

## 2017-02-24 ENCOUNTER — Encounter: Attending: Physician Assistant | Primary: Family Medicine

## 2017-03-05 ENCOUNTER — Encounter: Attending: Orthopaedic Surgery | Primary: Family Medicine

## 2017-03-18 NOTE — Telephone Encounter (Signed)
Maria Coin has called requesting a refill of their controlled medication, Tramadol 50 mg and Norco 7.5-325 mg, for the management of Bilateral hip pain .    Last office visit date: 02/03/17    Date last pmp was pulled and reviewed : 03/18/17 and compliant. Last filled 02/22/17 and 02/26/17    Was the patient compliant when the above report was pulled?  yes    Analgesia: Patient report 60-70% of pain relief with current medication regimen    Aberrancies: No aberrancies in the last 30 days.    ADL's: Patient report she is able to do basic ADL's at home.    Adverse Reaction:Patient report no adverse reaction.    Provider's last note and plan of care reviewed? yes  Request forwarded to provider for review.    Provider was made aware.

## 2017-03-18 NOTE — Progress Notes (Signed)
Called patient to come pick up medication prescription.

## 2017-05-28 ENCOUNTER — Encounter: Attending: Orthopaedic Surgery | Primary: Family Medicine

## 2017-06-05 ENCOUNTER — Encounter: Attending: Orthopaedic Surgery | Primary: Family Medicine

## 2017-06-19 ENCOUNTER — Ambulatory Visit: Admit: 2017-06-19 | Discharge: 2017-06-29 | Payer: PRIVATE HEALTH INSURANCE | Primary: Family Medicine

## 2017-06-19 DIAGNOSIS — E669 Obesity, unspecified: Secondary | ICD-10-CM

## 2017-06-19 NOTE — Progress Notes (Signed)
Patient attended a Medically Supervised Weight Loss New Patient Orientation today where we discussed:  - New Direction Very Low Calorie Diet details  - Medical Supervision  - Nutrition education  - Cost of Meal Replacements  - Policies and compliance required for program enrollment.     Patient???s initial consultation with physician is tentatively scheduled for:  Future Appointments   Date Time Provider [place]   08/06/2017 10:30 AM Leana Gamer, NP [facility] SCHED   02/01/2018  3:00 PM Elvin So, MD [place]

## 2017-06-22 ENCOUNTER — Telehealth

## 2017-08-06 ENCOUNTER — Encounter: Attending: Family | Primary: Family Medicine

## 2017-08-21 ENCOUNTER — Ambulatory Visit: Admit: 2017-08-21 | Discharge: 2017-08-21 | Attending: Urology | Primary: Family Medicine

## 2017-08-21 DIAGNOSIS — N816 Rectocele: Secondary | ICD-10-CM

## 2017-08-21 LAB — AMB POC URINALYSIS DIP STICK AUTO W/O MICRO
Bilirubin (UA POC): NEGATIVE
Glucose (UA POC): NEGATIVE
Ketones (UA POC): NEGATIVE
Leukocyte esterase (UA POC): NEGATIVE
Nitrites (UA POC): NEGATIVE
Protein (UA POC): NEGATIVE
Specific gravity (UA POC): 1.015 (ref 1.001–1.035)
Urobilinogen (UA POC): 0.2 (ref 0.2–1)
pH (UA POC): 5 (ref 4.6–8.0)

## 2017-08-21 NOTE — Addendum Note (Signed)
Addended by: Eda Keys on: 08/26/2017 01:36 PM     Modules accepted: Orders

## 2017-08-21 NOTE — Progress Notes (Signed)
Cassandra Daniels  Feb 09, 1961          ICD-10-CM ICD-9-CM    1. Rectocele N81.6 618.04 AMB POC URINALYSIS DIP STICK AUTO W/O MICRO      INSERT,NON-INDWELLING BLADDER CATHETER   2. Urge incontinence N39.41 788.31    3. SUI (stress urinary incontinence, female) N39.3 625.6        Assessment and Plan:  UA today: trace blood   Cath PVR today: 30cc    1. Grade 3 Rectocele  - s/p vaginal rectocele repair (posterior colporrhaphy) 07/09/2016   Exam today: well-supported apex, grade 2 rectocele      Will continue to monitor. Do not recommended surgical management at this time.     2. Urge Incontinence (only at night)    Limit fluid intake 2 hours prior to bedtime.    Advised patient to monitor time & practice timed voiding.     3. Stress Urinary Incontinence   Not demonstrable on exam today (cath pvr = 30cc)   Will continue to monitor.     4. ?UTI   Urine sent for culture today. Will Rx abx accordingly.        RTO prn     BMI 32.92 Patient's BMI is out of the normal parameters.  Information about BMI was given to the patient.      Chief Complaint   Patient presents with   ??? Rectocele       History of Present Illness:  Cassandra Daniels is a 57 y.o. female G3P3 who presents today in follow up for an established history of grade 3 rectocele. Patient is s/p vaginal rectocele repair (posterior colporrhaphy) on 07/09/16. Patient was last seen in the office on 01/30/17 and exam at that time revealed grade 1 rectocele. Patient was advised to continue kegel exercises. She is not currently prescribed any GU medications.     Today, patient notes new sx of urinary incontinence when holding her bladder (mostly in the morning)   Daytime freq x2-3  Notes some sensation ofbulge within the vagina   Reports SUI, not very bothersome   Denies any issues with constipation     Asymptomatic for infection today.  Denies any gross hematuria or dysuria.  No N/V/F/C.     Notes recent dx of fibromyalgia      Baseline Urinary sx's:   +Urinary urgency  +frequency.   +Episodes of fecal urgency.     PMHx:  S/p post menopausal post-hysterectomy at age 43  S/p cholecystectomy & gastric bypass.     Past Medical History:   Diagnosis Date   ??? Arthritis    ??? Atherosclerotic cardiovascular disease    ??? Bilateral knee pain    ??? Broken nose 1999    surgery   ??? Carpal tunnel syndrome    ??? Chronic fatigue    ??? Fibrocystic breast     bilateral    ??? Gastric bypass status for obesity 2009   ??? Left groin pain    ??? Left hip pain    ??? Meningioma nos    ??? MVP (mitral valve prolapse)    ??? Pancreatitis 2014   ??? Paroxysmal atrial tachycardia ([place])    ??? Paroxysmal tachycardia ([place])    ??? PVC (premature ventricular contraction)    ??? Right hip pain    ??? Right shoulder pain    ??? Unspecified adverse effect of anesthesia     difficulty waking up    ??? Unspecified sleep apnea  does not use cpap   ??? Vitamin B 12 deficiency        Past Surgical History:   Procedure Laterality Date   ??? ABLATION OF SVT  2007    by Dr. Serita Sheller   ??? HX BLADDER REPAIR  1996   ??? HX CHOLECYSTECTOMY  2014   ??? HX GASTRIC BYPASS  11/07   ??? HX HYSTERECTOMY     ??? HX KNEE ARTHROSCOPY      x5   ??? HX RHINOPLASTY     ??? HX SHOULDER ARTHROSCOPY     ??? MAM BIOPSY BREAST STEREOTACTIC  april, 2013    right, benign       Social History     Tobacco Use   ??? Smoking status: Never Smoker   ??? Smokeless tobacco: Never Used   Substance Use Topics   ??? Alcohol use: No     Alcohol/week: 0.0 oz   ??? Drug use: No       Allergies   Allergen Reactions   ??? Beta Blocker [Beta-Blockers (Beta-Adrenergic Blocking Agts)] Other (comments)     Fatigue   ??? Tetracycline Hives       Family History   Problem Relation Age of Onset   ??? Asthma Mother    ??? Breast Cancer Mother 33   ??? Cancer Mother    ??? Other Father         cabg   ??? Heart Disease Father    ??? High Cholesterol Father    ??? Heart Attack Maternal Grandmother 64   ??? Breast Cancer Maternal Grandmother 55       Current Outpatient Medications   Medication Sig Dispense Refill    ??? nystatin (MYCOSTATIN) 100,000 unit/mL suspension   0   ??? traMADol (ULTRAM) 50 mg tablet TK 1 TO 2 TS PO QD PRN 60 Tab 0   ??? cephALEXin (KEFLEX) 500 mg capsule TK ONE C PO QID FOR 3 DAYS.  0   ??? PROMETHEGAN 25 mg suppository INSERT 1 SUPPOSITORY RECTALLY EVERY 6 HOURS FOR NAUSEA AND VOMITING.  0   ??? zolpidem CR (AMBIEN CR) 12.5 mg tablet TK 1 T PO QHS  0   ??? benzonatate (TESSALON) 100 mg capsule TK ONE C PO TID FOR 10 DAYS.  0   ??? azithromycin (ZITHROMAX) 500 mg tab TK 1 T PO D WF FOR 5 DAYS  0   ??? acetaminophen (TYLENOL) 325 mg tablet Take  by mouth every four (4) hours as needed for Pain.     ??? traMADol (ULTRAM-ER) 100 mg Tb24 Take 100 mg by mouth daily.     ??? venlafaxine-SR (EFFEXOR-XR) 150 mg capsule Take 150 mg by mouth daily.     ??? clonazePAM (KLONOPIN) 0.25 mg disintegrating tablet Take 0.5 mg by mouth three (3) times daily.     ??? primidone (MYSOLINE) 50 mg tablet Take 50 mg by mouth daily.     ??? cyclobenzaprine (FLEXERIL) 10 mg tablet Take  by mouth three (3) times daily as needed for Muscle Spasm(s).     ??? diclofenac (VOLTAREN) 1 % gel Apply  to affected area as needed.     ??? ferrous sulfate (IRON) 325 mg (65 mg iron) tablet Take 325 mg by mouth every seven (7) days.     ??? hyoscyamine SL (LEVSIN/SL) 0.125 mg SL tablet Take 1 Tab by mouth every six (6) hours as needed for Cramping. 30 Tab 3   ??? CYANOCOBALAMIN, VITAMIN B-12, (VITAMIN B-12 PO)  Take 1 Tab by mouth every seven (7) days.           Review of Systems  Constitutional: Fever: No  Skin: Rash: No  HEENT: Hearing difficulty: No  Eyes: Blurred vision: No  Cardiovascular: Chest pain: No  Respiratory: Shortness of breath: No  Gastrointestinal: Nausea/vomiting: No  Musculoskeletal: Back pain: No  Neurological: Weakness: No  Psychological: Memory loss: No  Comments/additional findings:     PHYSICAL EXAMINATION:   Visit Vitals  BP 128/90   Ht 5\' 2"  (1.575 m)   Wt 185 lb (83.9 kg)   BMI 33.84 kg/m??      Constitutional: WDWN, Pleasant and appropriate affect, No acute distress.    CV:  No peripheral swelling noted  Respiratory: No respiratory distress or difficulties  Abdomen:  No abdominal masses or tenderness.  No CVA tenderness. No hernias noted.   GU Female:  Rectocele grade 2, well-supported apex   Skin: No jaundice.    Neuro/Psych:  Alert and oriented x 3. Affect appropriate.   Lymphatic:   No enlarged inguinal lymph nodes.          REVIEW OF LABS AND IMAGING:      Results for orders placed or performed in visit on 08/21/17   AMB POC URINALYSIS DIP STICK AUTO W/O MICRO   Result Value Ref Range    Color (UA POC) Yellow     Clarity (UA POC) Clear     Glucose (UA POC) Negative Negative    Bilirubin (UA POC) Negative Negative    Ketones (UA POC) Negative Negative    Specific gravity (UA POC) 1.015 1.001 - 1.035    Blood (UA POC) Trace Negative    pH (UA POC) 5.0 4.6 - 8.0    Protein (UA POC) Negative Negative    Urobilinogen (UA POC) 0.2 mg/dL 0.2 - 1    Nitrites (UA POC) Negative Negative    Leukocyte esterase (UA POC) Negative Negative       A copy of today's office visit with all pertinent imaging results and labs were sent to the referring physician,[place], Charlann Lange, MD    Elvin So, MD     Medical Documentation is provided with the assistance of Dontranika M. Horton, Medical Scribe for Elvin So, MD

## 2017-08-21 NOTE — Progress Notes (Signed)
Cassandra Daniels is a 57 y.o. female presents today for a straight catheter insertion for urine culture per Dr. Jolaine Artist order.   Visit Vitals  BP 128/90   Ht 5\' 2"  (1.575 m)   Wt 185 lb (83.9 kg)   BMI 33.84 kg/m??        Procedure:   The genital and perineal areas were cleansed with antiseptic solution on the cotton balls.  With clean gloves the area was cleansed with antiseptic solution again.  I applied sterile lubricant liberally to the catheter tip, lubricating at least six inches of the catheter.  A 14 fr cath catheter was inserted into the urinary meatus. When urine started to flow, I inserted the catheter approximately one inch further without difficulty and bladder drained.      Catheter size:  14  Type:  Straight-tip   Material:  Latex    Urine was:   clear  Specimen obtained:  YES    Results for orders placed or performed in visit on 08/21/17   AMB POC URINALYSIS DIP STICK AUTO W/O MICRO   Result Value Ref Range    Color (UA POC) Yellow     Clarity (UA POC) Clear     Glucose (UA POC) Negative Negative    Bilirubin (UA POC) Negative Negative    Ketones (UA POC) Negative Negative    Specific gravity (UA POC) 1.015 1.001 - 1.035    Blood (UA POC) Trace Negative    pH (UA POC) 5.0 4.6 - 8.0    Protein (UA POC) Negative Negative    Urobilinogen (UA POC) 0.2 mg/dL 0.2 - 1    Nitrites (UA POC) Negative Negative    Leukocyte esterase (UA POC) Negative Negative       Orders Placed This Encounter   ??? AMB POC URINALYSIS DIP STICK AUTO W/O MICRO   ??? INSERT,NON-INDWELLING BLADDER CATHETER   ??? INSERT,NON-INDWELLING BLADDER CATHETER   ??? nystatin (MYCOSTATIN) 100,000 unit/mL suspension     Refill:  0       Occupational psychologist

## 2018-01-13 NOTE — Telephone Encounter (Signed)
Patient called back and instructed to come by the office to sign medical release.    She needs all her records for disability     She will come by the [facility] office

## 2018-02-01 ENCOUNTER — Encounter: Attending: Urology | Primary: Family Medicine

## 2019-02-03 LAB — HEMOGLOBIN A1C
Estimated Avg Glucose, External: 120 mg/dL (ref 91–123)
Hemoglobin A1C, External: 5.8 % — ABNORMAL HIGH (ref 4.8–5.6)

## 2019-02-28 ENCOUNTER — Ambulatory Visit: Primary: Family Medicine

## 2019-02-28 ENCOUNTER — Inpatient Hospital Stay: Admit: 2019-02-28 | Payer: PRIVATE HEALTH INSURANCE | Primary: Family Medicine

## 2019-02-28 ENCOUNTER — Inpatient Hospital Stay
Admit: 2019-02-28 | Payer: PRIVATE HEALTH INSURANCE | Attending: Adult Reconstructive Orthopaedic Surgery | Primary: Family Medicine

## 2019-02-28 DIAGNOSIS — Z01812 Encounter for preprocedural laboratory examination: Secondary | ICD-10-CM

## 2019-02-28 LAB — CBC W/O DIFF
HCT: 43.9 % (ref 37.0–50.0)
HGB: 14.1 gm/dl (ref 13.0–17.2)
MCH: 29.2 pg (ref 25.4–34.6)
MCHC: 32.1 gm/dl (ref 30.0–36.0)
MCV: 90.9 fL (ref 80.0–98.0)
MPV: 10.7 fL — ABNORMAL HIGH (ref 6.0–10.0)
PLATELET: 245 10*3/uL (ref 140–450)
RBC: 4.83 M/uL (ref 3.60–5.20)
RDW-SD: 51.4 — ABNORMAL HIGH (ref 36.4–46.3)
WBC: 4.8 10*3/uL (ref 4.0–11.0)

## 2019-02-28 LAB — METABOLIC PANEL, BASIC
Anion gap: 5 mmol/L (ref 5–15)
BUN: 10 mg/dl (ref 7–25)
CO2: 29 mEq/L (ref 21–32)
Calcium: 9.3 mg/dl (ref 8.5–10.1)
Chloride: 108 mEq/L — ABNORMAL HIGH (ref 98–107)
Creatinine: 0.9 mg/dl (ref 0.6–1.3)
GFR est AA: >60
GFR est non-AA: >60
Glucose: 96 mg/dl (ref 74–106)
Potassium: 4.7 mEq/L (ref 3.5–5.1)
Sodium: 142 mEq/L (ref 136–145)

## 2019-02-28 LAB — PROTHROMBIN TIME + INR
INR: 1 (ref 0.1–1.1)
Prothrombin time: 11.7 seconds (ref 10.2–12.9)

## 2019-02-28 LAB — PTT: aPTT: 31.4 seconds (ref 25.1–36.5)

## 2019-02-28 LAB — CBC
Hematocrit: 43.9 % (ref 37.0–50.0)
Hemoglobin: 14.1 gm/dl (ref 13.0–17.2)
MCH: 29.2 pg (ref 25.4–34.6)
MCHC: 32.1 gm/dl (ref 30.0–36.0)
MCV: 90.9 fL (ref 80.0–98.0)
MPV: 10.7 fL — ABNORMAL HIGH (ref 6.0–10.0)
Platelets: 245 10*3/uL (ref 140–450)
RBC: 4.83 M/uL (ref 3.60–5.20)
RDW-SD: 51.4 — ABNORMAL HIGH (ref 36.4–46.3)
WBC: 4.8 10*3/uL (ref 4.0–11.0)

## 2019-02-28 LAB — BASIC METABOLIC PANEL
Anion Gap: 5 mmol/L (ref 5–15)
BUN: 10 mg/dl (ref 7–25)
CO2: 29 mEq/L (ref 21–32)
Calcium: 9.3 mg/dl (ref 8.5–10.1)
Chloride: 108 mEq/L — ABNORMAL HIGH (ref 98–107)
Creatinine: 0.9 mg/dl (ref 0.6–1.3)
EGFR IF NonAfrican American: >60
GFR African American: >60
Glucose: 96 mg/dl (ref 74–106)
Potassium: 4.7 mEq/L (ref 3.5–5.1)
Sodium: 142 mEq/L (ref 136–145)

## 2019-02-28 LAB — PROTIME-INR
INR: 1 (ref 0.1–1.1)
Protime: 11.7 seconds (ref 10.2–12.9)

## 2019-02-28 LAB — APTT: aPTT: 31.4 seconds (ref 25.1–36.5)

## 2019-02-28 NOTE — Anesthesia Pre-Procedure Evaluation (Signed)
Relevant Problems   No relevant active problems       Anesthetic History   No history of anesthetic complications       Comments: Anesthesia consult done 02/28/19 Maudie Mercury, NP    H/o of delayed awakening      Review of Systems / Medical History  Patient summary reviewed, nursing notes reviewed and pertinent labs reviewed    Pulmonary        Sleep apnea: No treatment      Pertinent negatives: No smoker     Neuro/Psych   Within defined limits           Cardiovascular            Dysrhythmias (h/o PAT/SVT, s/p ablation 2007 - patient states still has it but doesn't follow with Cardiologist)       Exercise tolerance: >4 METS  Comments: 2019 Echo- normal LV size and systolic fx, EF 61%, normal diastolic fx, no significant valve pathology      GI/Hepatic/Renal               Comments: S/p Gastric Bypass 2007 Endo/Other        Obesity and arthritis  Pertinent negatives: Morbid obesity: BMI 35.  Comments: S/p Right TKR 11/2017 and Left TKR 05/2018  Fibromyalgia- followed by Pain Management- Percocet 7.5/325 TID and Tramadol ER 100 mg daily Other Findings   Comments: Medical clearance- requested note         Physical Exam    Airway  Mallampati: II  TM Distance: < 4 cm  Neck ROM: normal range of motion   Mouth opening: Normal     Cardiovascular  Regular rate and rhythm,  S1 and S2 normal,  no murmur, click, rub, or gallop             Dental    Dentition: Caps/crowns     Pulmonary  Breath sounds clear to auscultation               Abdominal         Other Findings   Comments: Right wrist healing blisters s/t steroids injections, been present x 1 year         Anesthetic Plan    ASA: 3  Patient did not consent to regional anesthesiaAnesthesia type: general      Post-op pain plan if not by surgeon: peripheral nerve block single    Induction: Intravenous  Anesthetic plan and risks discussed with: Patient

## 2019-02-28 NOTE — Anesthesia Pre-Procedure Evaluation (Addendum)
Relevant Problems   No relevant active problems       Anesthetic History   No history of anesthetic complications       Comments: Anesthesia consult done 02/28/19 Maudie Mercury, NP    H/o of delayed awakening      Review of Systems / Medical History  Patient summary reviewed, nursing notes reviewed and pertinent labs reviewed    Pulmonary        Sleep apnea: No treatment      Pertinent negatives: No smoker     Neuro/Psych   Within defined limits           Cardiovascular            Dysrhythmias (h/o PAT/SVT, s/p ablation 2007 - patient states still has it but doesn't follow with Cardiologist)       Exercise tolerance: >4 METS  Comments: 2019 Echo- normal LV size and systolic fx, EF 63%, normal diastolic fx, no significant valve pathology      GI/Hepatic/Renal               Comments: S/p Gastric Bypass 2007 Endo/Other        Obesity and arthritis  Pertinent negatives: Morbid obesity: BMI 35.  Comments: S/p Right TKR 11/2017 and Left TKR 05/2018  Fibromyalgia- followed by Pain Management- Percocet 7.5/325 TID and Tramadol ER 100 mg daily Other Findings   Comments: Medical clearance- requested note         Physical Exam    Airway  Mallampati: II  TM Distance: < 4 cm  Neck ROM: normal range of motion   Mouth opening: Normal     Cardiovascular  Regular rate and rhythm,  S1 and S2 normal,  no murmur, click, rub, or gallop             Dental    Dentition: Caps/crowns     Pulmonary  Breath sounds clear to auscultation               Abdominal         Other Findings   Comments: Right wrist healing blisters s/t steroids injections, been present x 1 year         Anesthetic Plan    ASA: 3  Patient did not consent to regional anesthesiaAnesthesia type: general      Post-op pain plan if not by surgeon: peripheral nerve block single    Induction: Intravenous  Anesthetic plan and risks discussed with: Patient

## 2019-03-01 ENCOUNTER — Ambulatory Visit: Primary: Family Medicine

## 2019-03-01 LAB — URINALYSIS W/ RFLX MICROSCOPIC
Bilirubin, Urine: NEGATIVE
Bilirubin: NEGATIVE
Glucose, Ur: NEGATIVE mg/dl
Glucose: NEGATIVE mg/dl
Ketone: NEGATIVE mg/dl
Ketones, Urine: NEGATIVE mg/dl
Leukocyte Esterase, Urine: NEGATIVE
Leukocyte Esterase: NEGATIVE
Nitrite, Urine: NEGATIVE
Nitrites: NEGATIVE
Protein, UA: NEGATIVE mg/dl
Protein: NEGATIVE mg/dl
Specific Gravity, UA: 1.025 (ref 1.005–1.030)
Specific gravity: 1.025 (ref 1.005–1.030)
Urobilinogen, UA, POCT: 0.2 mg/dl (ref 0.0–1.0)
Urobilinogen: 0.2 mg/dl (ref 0.0–1.0)
pH (UA): 5.5 (ref 5.0–9.0)
pH, UA: 5.5 (ref 5.0–9.0)

## 2019-03-01 LAB — EKG, 12 LEAD, INITIAL
Atrial Rate: 77 {beats}/min
Calculated P Axis: 52 degrees
Calculated R Axis: 61 degrees
Calculated T Axis: 35 degrees
Diagnosis: NORMAL
P-R Interval: 130 ms
Q-T Interval: 380 ms
QRS Duration: 82 ms
QTC Calculation (Bezet): 430 ms
Ventricular Rate: 77 {beats}/min

## 2019-03-01 LAB — POC URINE MICROSCOPIC

## 2019-03-01 LAB — EKG 12-LEAD
Atrial Rate: 77 {beats}/min
Diagnosis: NORMAL
P Axis: 52 degrees
P-R Interval: 130 ms
Q-T Interval: 380 ms
QRS Duration: 82 ms
QTc Calculation (Bazett): 430 ms
R Axis: 61 degrees
T Axis: 35 degrees
Ventricular Rate: 77 {beats}/min

## 2019-03-02 LAB — CULTURE, URINE
CULTURE RESULT: NO GROWTH
Culture result: NO GROWTH

## 2019-03-14 NOTE — Care Coordination-Inpatient (Signed)
Nurse Navigator by Raliegh Ip, NP at 03/14/19 1025                Author: Raliegh Ip, NP  Service: Nurse Practitioner  Author Type: Nurse Practitioner       Filed: 03/21/19 1335  Date of Service: 03/14/19 1025  Status: Signed          Editor: Raliegh Ip, NP (Nurse Practitioner)                         [facility]      Major Joint Replacement (Hip or Knee)   Medical Necessity              Patient: Cassandra Daniels  Age: 58 y.o.  Sex: female          Date of Birth: February 08, 1961  Admit Date: (Not on file)  PCP: Georgianne Fick, MD         MRN: [id number]   CSN: [id number]                   HPI:     Leilanie Rauda is a 58 y.o.  female who presents for evaluation of total joint replacement of right knee       Present illness from onset until present: Right painful total knee arthoplasty      Description of pain (onset, duration, character, aggravating, reliving factors): Had surgery on 12/02/2017, pain has returned after injection and surgery      Functional limitations of Activities of Daily Living (ADLs) specify: [x]   Yes  Increase in assistance with bed, chair walking, bathing and dressing []   No  []  N/A         Safety issues (e.g. falls) []  Yes  [x]  No  []   N/A        Contraindications to non-surgical treatments: []   Yes   [x]  No  []  N/A         Outcomes of non surgical treatments:   ??  Trial of Medications (ex. NSAIDS, ANalgesics, etc): [x]   Yes   []  No  []  N/A    ??  Weight loss: []  Yes   [x]  No  []   N/A     ??  Intra-articular injections: [x]  Yes   []  No  []   N/A      ??  Physical Therapy and/or home exercise plans: [x]   Yes   []  No  []  N/A       ??  Assistive devices (I.e. braces-specific type, orthotics, cane, walker): []   Yes   [x]  No  []  N/A         Co morbidities  []  Yes  [x]  No  []   N/A           Physical Examination:        Joint Exam with detailed objective findings   ??  Deformity    []   Yes   [x]  No  []  N/A   ??  Range of Motion    []   Yes  [x]  No  []   N/A   ??  Crepitus    [x]   Yes over patella   []  No  []  N/A   ??  Effusions    []   Yes   [x]  No  []  N/A   ??  Tenderness    [x]   Yes  []  No  []  N/A   ??  Gait description (with/without  mobility aides: [x]   Yes  limps []  No  []  N/A           Diagnostics Studies/Results:     Preoperative imaging studies (I.e. X-rays, etc)    No new Xrays        Medical Record includes Documentation of Specific Conditions:     ??  Ostoarthritis (mild, moderate, severe)    []   Yes    [x]  No     []  N/A   ??  Inflammatory Arthritis (I.e. rheumatoid arthritis, psoriatic arthritis)   []   Yes    [x]  No  []  N/A   ??  Failure of previous osteotomy    [x]   Yes   []  No   []  N/A   ??  Malignancy of distal femur, proximal tibia, knee joint and soft tissues   o  Failure of previous un compartmentalized knee replacement    []  Yes  [x]   No  []  N/A   o  Avascular necrosis of knee  []   Yes [x]  No  []  N/A   o  Malignancy of the pelvis or proximal femur or soft tissue of the hip    []   Yes [x]  No  []  N/A   o  Avascular necrosis of the femoral head    []   Yes   [x]  No  []  N/A   o  Fractures (e.g. distal femur, femoral neck, acetabulum)    []   Yes    [x]  No  []  N/A   o  Nonunion, malunion or failure of previous hip fracture surgery   []   Yes  [x]  No  []  N/A   o  Osteonecrosis     []   Yes   [x]  No  []  N/A        Impression / Plan / Order        Total Knee/Hip Replacement Indicated:  [x]  Yes    []  No                      Raliegh Ip, NP   March 14, 2019   10:26 AM

## 2019-03-14 NOTE — Nurse Consult (Signed)
[facility]    Major Joint Replacement (Hip or Knee)  Medical Necessity       Patient: Cassandra Daniels Age: 58 y.o. Sex: female    Date of Birth: 11/11/1960 Admit Date: (Not on file) PCP: Georgianne Fick, MD   MRN: [id number]  CSN: [id number]           HPI:   Cassandra Daniels is a 58 y.o. female who presents for evaluation of total joint replacement of right knee     Present illness from onset until present: Right painful total knee arthoplasty    Description of pain (onset, duration, character, aggravating, reliving factors): Had surgery on 12/02/2017, pain has returned after injection and surgery    Functional limitations of Activities of Daily Living (ADLs) specify: [x]  Yes  Increase in assistance with bed, chair walking, bathing and dressing []  No  []  N/A       Safety issues (e.g. falls) []  Yes  [x]  No  []  N/A      Contraindications to non-surgical treatments: []  Yes   [x]  No  []  N/A       Outcomes of non surgical treatments:  ? Trial of Medications (ex. NSAIDS, ANalgesics, etc): [x]  Yes   []  No  []  N/A   ? Weight loss: []  Yes   [x]  No  []  N/A    ? Intra-articular injections: [x]  Yes   []  No  []  N/A     ? Physical Therapy and/or home exercise plans: [x]  Yes   []  No  []  N/A      ? Assistive devices (I.e. braces-specific type, orthotics, cane, walker): []  Yes   [x]  No  []  N/A       Co morbidities  []  Yes  [x]  No  []  N/A       Physical Examination:     Joint Exam with detailed objective findings  ? Deformity    []  Yes   [x]  No  []  N/A  ? Range of Motion    []  Yes  [x]  No  []  N/A  ? Crepitus    [x]  Yes over patella   []  No  []  N/A  ? Effusions    []  Yes   [x]  No  []  N/A  ? Tenderness    [x]  Yes  []  No  []  N/A  ? Gait description (with/without mobility aides: [x]  Yes  limps []  No  []  N/A       Diagnostics Studies/Results:   Preoperative imaging studies (I.e. X-rays, etc)   No new Xrays    Medical Record includes Documentation of Specific Conditions:    ? Ostoarthritis (mild, moderate, severe)    []  Yes    [x]  No     []  N/A  ? Inflammatory Arthritis (I.e. rheumatoid arthritis, psoriatic arthritis)   []  Yes    [x]  No  []  N/A  ? Failure of previous osteotomy    [x]  Yes   []  No   []  N/A  ? Malignancy of distal femur, proximal tibia, knee joint and soft tissues  o Failure of previous un compartmentalized knee replacement    []  Yes  [x]  No  []  N/A  o Avascular necrosis of knee  []  Yes [x]  No  []  N/A  o Malignancy of the pelvis or proximal femur or soft tissue of the hip    []  Yes [x]  No  []  N/A  o Avascular necrosis of the femoral head    []  Yes   [x]   No  []  N/A  o Fractures (e.g. distal femur, femoral neck, acetabulum)    []  Yes    [x]  No  []  N/A  o Nonunion, malunion or failure of previous hip fracture surgery   []  Yes  [x]  No  []  N/A  o Osteonecrosis     []  Yes   [x]  No  []  N/A    Impression / Plan / Order     Total Knee/Hip Replacement Indicated:  [x]  Yes   []  No               Raliegh Ip, NP  March 14, 2019  10:26 AM

## 2019-03-16 ENCOUNTER — Inpatient Hospital Stay: Admit: 2019-03-16 | Payer: PRIVATE HEALTH INSURANCE | Primary: Family Medicine

## 2019-03-16 DIAGNOSIS — Z01812 Encounter for preprocedural laboratory examination: Secondary | ICD-10-CM

## 2019-03-16 LAB — ANTIBODY SCREEN
Antibody Screen: NEGATIVE
Antibody screen: NEGATIVE

## 2019-03-16 LAB — BLOOD TYPE, (ABO+RH)
ABO/Rh(D): O NEG
ABO/Rh: O NEG

## 2019-03-21 ENCOUNTER — Inpatient Hospital Stay
Admit: 2019-03-21 | Discharge: 2019-03-23 | Disposition: A | Discharge status: Home Health | Payer: PRIVATE HEALTH INSURANCE | Attending: Adult Reconstructive Orthopaedic Surgery | Admitting: Adult Reconstructive Orthopaedic Surgery

## 2019-03-21 ENCOUNTER — Inpatient Hospital Stay: Admit: 2019-03-21 | Payer: PRIVATE HEALTH INSURANCE | Primary: Family Medicine

## 2019-03-21 DIAGNOSIS — T8484XA Pain due to internal orthopedic prosthetic devices, implants and grafts, initial encounter: Secondary | ICD-10-CM

## 2019-03-21 MED ORDER — DEXMEDETOMIDINE 100 MCG/ML IV SOLN
100 mcg/mL | INTRAVENOUS | Status: DC | PRN
Start: 2019-03-21 — End: 2019-03-21
  Administered 2019-03-21 (×5): via INTRAVENOUS

## 2019-03-21 MED ORDER — PROMETHAZINE IN NS 6.25 MG/50 ML IV PIGGY BAG
6.25 mg/50 ml | INTRAVENOUS | Status: DC | PRN
Start: 2019-03-21 — End: 2019-03-23

## 2019-03-21 MED ORDER — KETAMINE 50 MG/ML (1 ML) INTRAVENOUS SYRINGE
50 mg/mL (1 mL) | INTRAVENOUS | Status: DC | PRN
Start: 2019-03-21 — End: 2019-03-21
  Administered 2019-03-21 (×6): via INTRAVENOUS

## 2019-03-21 MED ORDER — GABAPENTIN 300 MG CAP
300 mg | Freq: Every evening | ORAL | Status: DC
Start: 2019-03-21 — End: 2019-03-23
  Administered 2019-03-22 – 2019-03-23 (×3): via ORAL

## 2019-03-21 MED ORDER — OXYCODONE 5 MG TAB
5 mg | ORAL | Status: DC | PRN
Start: 2019-03-21 — End: 2019-03-21
  Administered 2019-03-21: 20:00:00 via ORAL

## 2019-03-21 MED ORDER — HYOSCYAMINE 0.125 MG SUBLINGUAL TAB
0.125 mg | Freq: Four times a day (QID) | SUBLINGUAL | Status: DC | PRN
Start: 2019-03-21 — End: 2019-03-23

## 2019-03-21 MED ORDER — VENLAFAXINE SR 150 MG 24 HR CAP
150 mg | Freq: Every day | ORAL | Status: DC
Start: 2019-03-21 — End: 2019-03-23
  Administered 2019-03-22 – 2019-03-23 (×3): via ORAL

## 2019-03-21 MED ORDER — BISACODYL 10 MG RECTAL SUPPOSITORY
10 mg | Freq: Every day | RECTAL | Status: DC | PRN
Start: 2019-03-21 — End: 2019-03-23

## 2019-03-21 MED ORDER — HYDROMORPHONE 1 MG/ML INJECTION SOLUTION
1 mg/mL | INTRAMUSCULAR | Status: DC | PRN
Start: 2019-03-21 — End: 2019-03-23
  Administered 2019-03-21 – 2019-03-23 (×8): via INTRAVENOUS

## 2019-03-21 MED ORDER — BUPIVACAINE-EPINEPHRINE (PF) 0.25 %-1:200,000 IJ SOLN
0.25 %-1:200,000 | INTRAMUSCULAR | Status: DC | PRN
Start: 2019-03-21 — End: 2019-03-21
  Administered 2019-03-21: 16:00:00 via PERIARTICULAR

## 2019-03-21 MED ORDER — PANTOPRAZOLE 40 MG TAB, DELAYED RELEASE
40 mg | Freq: Every day | ORAL | Status: DC
Start: 2019-03-21 — End: 2019-03-23
  Administered 2019-03-22 – 2019-03-23 (×2): via ORAL

## 2019-03-21 MED ORDER — OXYCODONE ER 10 MG TABLET,CRUSH RESISTANT,EXTENDED RELEASE 12 HR
10 mg | Freq: Two times a day (BID) | ORAL | Status: DC
Start: 2019-03-21 — End: 2019-03-23
  Administered 2019-03-22 – 2019-03-23 (×3): via ORAL

## 2019-03-21 MED ORDER — PROPOFOL 10 MG/ML IV EMUL
10 mg/mL | INTRAVENOUS | Status: DC | PRN
Start: 2019-03-21 — End: 2019-03-21
  Administered 2019-03-21 (×5): via INTRAVENOUS

## 2019-03-21 MED ORDER — PRIMIDONE 50 MG TAB
50 mg | Freq: Every day | ORAL | Status: DC
Start: 2019-03-21 — End: 2019-03-23
  Administered 2019-03-22 – 2019-03-23 (×3): via ORAL

## 2019-03-21 MED ORDER — DIPHENHYDRAMINE 25 MG CAP
25 mg | Freq: Three times a day (TID) | ORAL | Status: DC | PRN
Start: 2019-03-21 — End: 2019-03-23

## 2019-03-21 MED ORDER — SODIUM CHLORIDE 0.9 % IJ SYRG
INTRAMUSCULAR | Status: DC | PRN
Start: 2019-03-21 — End: 2019-03-21

## 2019-03-21 MED ORDER — EPHEDRINE SULFATE 50 MG/ML INJECTION SOLUTION
50 mg/mL | INTRAMUSCULAR | Status: DC | PRN
Start: 2019-03-21 — End: 2019-03-21
  Administered 2019-03-21 (×3): via INTRAVENOUS

## 2019-03-21 MED ORDER — HYDROMORPHONE 1 MG/ML INJECTION SOLUTION
1 mg/mL | INTRAMUSCULAR | Status: DC | PRN
Start: 2019-03-21 — End: 2019-03-21
  Administered 2019-03-21 (×3): via INTRAVENOUS

## 2019-03-21 MED ORDER — SODIUM CHLORIDE 0.9 % IV
1000 mg/10 mL (100 mg/mL) | INTRAVENOUS | Status: AC
Start: 2019-03-21 — End: 2019-03-21
  Administered 2019-03-21: 14:00:00 via INTRAVENOUS

## 2019-03-21 MED ORDER — MAGNESIUM HYDROXIDE 400 MG/5 ML ORAL SUSP
400 mg/5 mL | Freq: Every day | ORAL | Status: DC | PRN
Start: 2019-03-21 — End: 2019-03-23

## 2019-03-21 MED ORDER — CEFAZOLIN 2 GRAM/50 ML NS IVPB
Freq: Three times a day (TID) | INTRAVENOUS | Status: AC
Start: 2019-03-21 — End: 2019-03-21
  Administered 2019-03-21 – 2019-03-22 (×2): via INTRAVENOUS

## 2019-03-21 MED ORDER — PROMETHAZINE IN NS 6.25 MG/50 ML IV PIGGY BAG
6.25 mg/50 ml | INTRAVENOUS | Status: DC | PRN
Start: 2019-03-21 — End: 2019-03-21

## 2019-03-21 MED ORDER — LABETALOL 5 MG/ML IV SOLN
5 mg/mL | INTRAVENOUS | Status: DC | PRN
Start: 2019-03-21 — End: 2019-03-21

## 2019-03-21 MED ORDER — CYCLOBENZAPRINE 10 MG TAB
10 mg | Freq: Three times a day (TID) | ORAL | Status: DC | PRN
Start: 2019-03-21 — End: 2019-03-23

## 2019-03-21 MED ORDER — PHENYLEPHRINE 0.5 % NASAL SPRAY AEROSOL
0.5 % | NASAL | Status: DC | PRN
Start: 2019-03-21 — End: 2019-03-21
  Administered 2019-03-21: 16:00:00 via NASAL

## 2019-03-21 MED ORDER — ONDANSETRON (PF) 4 MG/2 ML INJECTION
4 mg/2 mL | INTRAMUSCULAR | Status: DC | PRN
Start: 2019-03-21 — End: 2019-03-21
  Administered 2019-03-21: 14:00:00 via INTRAVENOUS

## 2019-03-21 MED ORDER — ACETAMINOPHEN 500 MG TAB
500 mg | Freq: Four times a day (QID) | ORAL | Status: DC
Start: 2019-03-21 — End: 2019-03-23
  Administered 2019-03-22 – 2019-03-23 (×6): via ORAL

## 2019-03-21 MED ORDER — BUPIVACAINE-EPINEPHRINE (PF) 0.25 %-1:200,000 IJ SOLN
0.25 %-1:200,000 | INTRAMUSCULAR | Status: AC
Start: 2019-03-21 — End: ?

## 2019-03-21 MED ORDER — KETAMINE 50 MG/ML (1 ML) INTRAVENOUS SYRINGE
50 mg/mL (1 mL) | INTRAVENOUS | Status: AC
Start: 2019-03-21 — End: ?

## 2019-03-21 MED ORDER — HYDRALAZINE 20 MG/ML IJ SOLN
20 mg/mL | INTRAMUSCULAR | Status: DC | PRN
Start: 2019-03-21 — End: 2019-03-21

## 2019-03-21 MED ORDER — LACTATED RINGERS IV
INTRAVENOUS | Status: DC
Start: 2019-03-21 — End: 2019-03-21

## 2019-03-21 MED ORDER — DEXAMETHASONE SODIUM PHOSPHATE 4 MG/ML IJ SOLN
4 mg/mL | INTRAMUSCULAR | Status: AC
Start: 2019-03-21 — End: 2019-03-21
  Administered 2019-03-21 (×2): via PERINEURAL

## 2019-03-21 MED ORDER — PHENOL 1.4 % MUCOSAL AEROSOL SPRAY
1.4 % | Status: DC | PRN
Start: 2019-03-21 — End: 2019-03-23

## 2019-03-21 MED ORDER — ZOLPIDEM 5 MG TAB
5 mg | Freq: Every evening | ORAL | Status: DC | PRN
Start: 2019-03-21 — End: 2019-03-23
  Administered 2019-03-22 – 2019-03-23 (×2): via ORAL

## 2019-03-21 MED ORDER — FLUMAZENIL 0.1 MG/ML IV SOLN
0.1 mg/mL | INTRAVENOUS | Status: DC | PRN
Start: 2019-03-21 — End: 2019-03-21

## 2019-03-21 MED ORDER — MIDAZOLAM 1 MG/ML IJ SOLN
1 mg/mL | INTRAMUSCULAR | Status: AC
Start: 2019-03-21 — End: ?

## 2019-03-21 MED ORDER — GABAPENTIN 300 MG CAP
300 mg | Freq: Two times a day (BID) | ORAL | Status: DC
Start: 2019-03-21 — End: 2019-03-23
  Administered 2019-03-21 – 2019-03-23 (×7): via ORAL

## 2019-03-21 MED ORDER — FENTANYL CITRATE (PF) 50 MCG/ML IJ SOLN
50 mcg/mL | INTRAMUSCULAR | Status: DC | PRN
Start: 2019-03-21 — End: 2019-03-21
  Administered 2019-03-21 (×4): via INTRAVENOUS

## 2019-03-21 MED ORDER — ONDANSETRON (PF) 4 MG/2 ML INJECTION
4 mg/2 mL | Freq: Four times a day (QID) | INTRAMUSCULAR | Status: DC | PRN
Start: 2019-03-21 — End: 2019-03-23

## 2019-03-21 MED ORDER — OXYCODONE 5 MG TAB
5 mg | ORAL | Status: DC | PRN
Start: 2019-03-21 — End: 2019-03-21

## 2019-03-21 MED ORDER — ZOLPIDEM SR 12.5 MG MULTIPHASE TAB
12.5 mg | Freq: Every evening | ORAL | Status: DC | PRN
Start: 2019-03-21 — End: 2019-03-21

## 2019-03-21 MED ORDER — NALOXONE 0.4 MG/ML INJECTION
0.4 mg/mL | INTRAMUSCULAR | Status: DC | PRN
Start: 2019-03-21 — End: 2019-03-21

## 2019-03-21 MED ORDER — ACETAMINOPHEN 1,000 MG/100 ML (10 MG/ML) IV
1000 mg/100 mL (10 mg/mL) | Freq: Once | INTRAVENOUS | Status: AC
Start: 2019-03-21 — End: 2019-03-21
  Administered 2019-03-21: 12:00:00 via INTRAVENOUS

## 2019-03-21 MED ORDER — ASPIRIN 325 MG TAB, DELAYED RELEASE
325 mg | Freq: Two times a day (BID) | ORAL | Status: DC
Start: 2019-03-21 — End: 2019-03-22
  Administered 2019-03-22: 13:00:00 via ORAL

## 2019-03-21 MED ORDER — PREGABALIN 75 MG CAP
75 mg | Freq: Once | ORAL | Status: DC
Start: 2019-03-21 — End: 2019-03-21

## 2019-03-21 MED ORDER — LIDOCAINE HCL 1 % (10 MG/ML) IJ SOLN
10 mg/mL (1 %) | Freq: Once | INTRAMUSCULAR | Status: DC | PRN
Start: 2019-03-21 — End: 2019-03-21

## 2019-03-21 MED ORDER — HYDROMORPHONE 0.5 MG/0.5 ML SYRINGE
0.5 mg/ mL | INTRAMUSCULAR | Status: DC | PRN
Start: 2019-03-21 — End: 2019-03-21
  Administered 2019-03-21: 16:00:00 via INTRAVENOUS

## 2019-03-21 MED ORDER — ROPIVACAINE (PF) 5 MG/ML (0.5 %) INJECTION
5 mg/mL (0. %) | INTRAMUSCULAR | Status: AC
Start: 2019-03-21 — End: 2019-03-21
  Administered 2019-03-21: 13:00:00 via PERINEURAL

## 2019-03-21 MED ORDER — CALCIUM CARBONATE 200 MG (500 MG) CHEWABLE TAB
200 mg calcium (500 mg) | ORAL | Status: DC | PRN
Start: 2019-03-21 — End: 2019-03-23

## 2019-03-21 MED ORDER — MIDAZOLAM 1 MG/ML IJ SOLN
1 mg/mL | INTRAMUSCULAR | Status: DC | PRN
Start: 2019-03-21 — End: 2019-03-21
  Administered 2019-03-21 (×4): via INTRAVENOUS

## 2019-03-21 MED ORDER — CLONAZEPAM 0.5 MG TAB
0.5 mg | Freq: Every day | ORAL | Status: DC | PRN
Start: 2019-03-21 — End: 2019-03-23

## 2019-03-21 MED ORDER — LIDOCAINE HCL 1 % (10 MG/ML) IJ SOLN
10 mg/mL (1 %) | INTRAMUSCULAR | Status: DC | PRN
Start: 2019-03-21 — End: 2019-03-21
  Administered 2019-03-21: 15:00:00 via SUBCUTANEOUS

## 2019-03-21 MED ORDER — LACTATED RINGERS IV
INTRAVENOUS | Status: DC
Start: 2019-03-21 — End: 2019-03-23
  Administered 2019-03-21 – 2019-03-22 (×2): via INTRAVENOUS

## 2019-03-21 MED ORDER — BUPIVACAINE LIPOSOME (PF) 266 MG/20 ML (13.3 MG/ML) SUSP, INFILTRATION
1.3 % (13.3 mg/mL) | Status: AC
Start: 2019-03-21 — End: ?

## 2019-03-21 MED ORDER — DIPHENHYDRAMINE HCL 50 MG/ML IJ SOLN
50 mg/mL | Freq: Three times a day (TID) | INTRAMUSCULAR | Status: DC | PRN
Start: 2019-03-21 — End: 2019-03-23

## 2019-03-21 MED ORDER — ROPIVACAINE (PF) 5 MG/ML (0.5 %) INJECTION
5 mg/mL (0. %) | INTRAMUSCULAR | Status: AC
Start: 2019-03-21 — End: ?

## 2019-03-21 MED ORDER — DOCUSATE SODIUM 100 MG CAP
100 mg | Freq: Two times a day (BID) | ORAL | Status: DC
Start: 2019-03-21 — End: 2019-03-23
  Administered 2019-03-22 – 2019-03-23 (×3): via ORAL

## 2019-03-21 MED ORDER — NALOXONE 0.4 MG/ML INJECTION
0.4 mg/mL | INTRAMUSCULAR | Status: DC | PRN
Start: 2019-03-21 — End: 2019-03-23

## 2019-03-21 MED ORDER — CEFAZOLIN 2 GRAM/50 ML NS IVPB
Freq: Once | INTRAVENOUS | Status: AC
Start: 2019-03-21 — End: 2019-03-21
  Administered 2019-03-21: 15:00:00 via INTRAVENOUS

## 2019-03-21 MED ORDER — DEXAMETHASONE SODIUM PHOSPHATE 10 MG/ML IJ SOLN
10 mg/mL | Freq: Once | INTRAMUSCULAR | Status: AC
Start: 2019-03-21 — End: 2019-03-21
  Administered 2019-03-21: 12:00:00 via INTRAVENOUS

## 2019-03-21 MED ORDER — METFORMIN 500 MG TAB
500 mg | Freq: Every evening | ORAL | Status: DC
Start: 2019-03-21 — End: 2019-03-23

## 2019-03-21 MED ORDER — ACETAMINOPHEN 1,000 MG/100 ML (10 MG/ML) IV
1000 mg/100 mL (10 mg/mL) | Freq: Four times a day (QID) | INTRAVENOUS | Status: AC
Start: 2019-03-21 — End: 2019-03-21
  Administered 2019-03-21 – 2019-03-22 (×2): via INTRAVENOUS

## 2019-03-21 MED ORDER — SODIUM CHLORIDE 0.9 % IV
10 mg/mL | INTRAVENOUS | Status: DC | PRN
Start: 2019-03-21 — End: 2019-03-21
  Administered 2019-03-21: 15:00:00 via INTRAVENOUS

## 2019-03-21 MED ORDER — ALUM-MAG HYDROXIDE-SIMETH 200 MG-200 MG-20 MG/5 ML ORAL SUSP
200-200-20 mg/5 mL | Freq: Four times a day (QID) | ORAL | Status: DC | PRN
Start: 2019-03-21 — End: 2019-03-23

## 2019-03-21 MED ORDER — DEXAMETHASONE SODIUM PHOSPHATE 10 MG/ML IJ SOLN
10 mg/mL | INTRAMUSCULAR | Status: AC
Start: 2019-03-21 — End: ?

## 2019-03-21 MED ORDER — SODIUM PHOSPHATES 19 GRAM-7 GRAM/118 ML ENEMA
19-7 gram/118 mL | Freq: Every day | RECTAL | Status: DC | PRN
Start: 2019-03-21 — End: 2019-03-23

## 2019-03-21 MED ORDER — ONDANSETRON 4 MG TAB, RAPID DISSOLVE
4 mg | Freq: Four times a day (QID) | ORAL | Status: DC | PRN
Start: 2019-03-21 — End: 2019-03-23

## 2019-03-21 MED ORDER — OXYCODONE 5 MG TAB
5 mg | ORAL | Status: DC | PRN
Start: 2019-03-21 — End: 2019-03-23
  Administered 2019-03-22 – 2019-03-23 (×10): via ORAL

## 2019-03-21 MED ORDER — LACTATED RINGERS IV
INTRAVENOUS | Status: DC | PRN
Start: 2019-03-21 — End: 2019-03-21
  Administered 2019-03-21: 12:00:00 via INTRAVENOUS

## 2019-03-21 MED FILL — METFORMIN 500 MG TAB: 500 mg | ORAL | Qty: 1

## 2019-03-21 MED FILL — PURE AND GENTLE (SALINE) 19 GRAM-7 GRAM/118 ML ENEMA: 19-7 gram/118 mL | RECTAL | Qty: 133

## 2019-03-21 MED FILL — KETAMINE 50 MG/ML (1 ML) INTRAVENOUS SYRINGE: 50 mg/mL (1 mL) | INTRAVENOUS | Qty: 1

## 2019-03-21 MED FILL — HYDROMORPHONE 1 MG/ML INJECTION SOLUTION: 1 mg/mL | INTRAMUSCULAR | Qty: 1

## 2019-03-21 MED FILL — LACTATED RINGERS IV: INTRAVENOUS | Qty: 1000

## 2019-03-21 MED FILL — CEFAZOLIN 2 GRAM/50 ML NS IVPB: INTRAVENOUS | Qty: 50

## 2019-03-21 MED FILL — LYRICA 75 MG CAPSULE: 75 mg | ORAL | Qty: 1

## 2019-03-21 MED FILL — ROPIVACAINE (PF) 5 MG/ML (0.5 %) INJECTION: 5 mg/mL (0. %) | INTRAMUSCULAR | Qty: 30

## 2019-03-21 MED FILL — OXYCODONE 5 MG TAB: 5 mg | ORAL | Qty: 2

## 2019-03-21 MED FILL — FENTANYL CITRATE (PF) 50 MCG/ML IJ SOLN: 50 mcg/mL | INTRAMUSCULAR | Qty: 2

## 2019-03-21 MED FILL — SENSORCAINE-MPF/EPINEPHRINE 0.25 %-1:200,000 INJECTION SOLUTION: 0.25 %-1:200,000 | INTRAMUSCULAR | Qty: 30

## 2019-03-21 MED FILL — DEXAMETHASONE SODIUM PHOSPHATE 10 MG/ML IJ SOLN: 10 mg/mL | INTRAMUSCULAR | Qty: 1

## 2019-03-21 MED FILL — MIDAZOLAM 1 MG/ML IJ SOLN: 1 mg/mL | INTRAMUSCULAR | Qty: 5

## 2019-03-21 MED FILL — OFIRMEV 1,000 MG/100 ML (10 MG/ML) INTRAVENOUS SOLUTION: 1000 mg/100 mL (10 mg/mL) | INTRAVENOUS | Qty: 100

## 2019-03-21 MED FILL — GABAPENTIN 300 MG CAP: 300 mg | ORAL | Qty: 1

## 2019-03-21 MED FILL — HYOSCYAMINE 0.125 MG SUBLINGUAL TAB: 0.125 mg | SUBLINGUAL | Qty: 1

## 2019-03-21 MED FILL — EXPAREL (PF) 1.3 % (13.3 MG/ML) SUSPENSION FOR LOCAL INFILTRATION: 1.3 % (13.3 mg/mL) | Qty: 20

## 2019-03-21 MED FILL — TRANEXAMIC ACID 1,000 MG/10 ML (100 MG/ML) IV: 1000 mg/10 mL (100 mg/mL) | INTRAVENOUS | Qty: 10

## 2019-03-21 NOTE — Care Coordination-Inpatient (Signed)
Nurse Navigator by Raliegh Ip, NP at 03/21/19 [place]                Author: Raliegh Ip, NP  Service: Nurse Practitioner  Author Type: Nurse Practitioner       Filed: 03/21/19 1339  Date of Service: 03/21/19 1337  Status: Signed          Editor: Raliegh Ip, NP (Nurse Practitioner)                      [place] NAVIGATOR NOTE             Patient: Cassandra Daniels  Age: 58 y.o.  Sex: female          Date of Birth: 1960-12-28  Admit Date: 03/21/2019  PCP: Georgianne Fick, MD         MRN: [id number]   CSN: [id number]   LOS: 0 day(s)        Surgeon(s):   Orene Desanctis, MD    Procedure(s):   REVISION RIGHT TOTAL KNEE ARTHROPLASTY      Estimated Discharge date:  03/22/2019   Discharge Location:  home        Subjective:     Patient Met in [facility], questions answered           Objective          Vitals:             03/21/19 1246  03/21/19 1249  03/21/19 1300  03/21/19 1315           BP:  (!) 148/92  (!) 148/92  (!) 153/93  143/83     Pulse:  97  97  96  93     Resp:  _0 Temp:  97.5 ??F (36.4 ??C)           SpO2:  95%  95%  98%  98%     Weight:                   Height:                      Raliegh Ip, NP   March 21, 2019    1:38 PM

## 2019-03-21 NOTE — Op Note (Signed)
[place]  Inpatient Operation Report  NAME:  Cassandra Daniels  SEX:   F  DATE: 03/21/2019  DOB: Jun 24, 1961  MR#    [id number]  ROOM:  2123  ACCT#  [id number]        PREOPERATIVE DIAGNOSIS:  Painful right total knee arthroplasty.    POSTOPERATIVE DIAGNOSIS:  Painful right total knee arthroplasty.    OPERATION PERFORMED:  Revision of the right total knee arthroplasty.       SURGEON:    Orene Desanctis, MD    ASSISTANT:    Ubaldo Glassing.  Her role included opening, closing, and retraction throughout the case.    ANESTHESIA:  General anesthesia, with an adductor block.    ESTIMATED BLOOD LOSS:  277 mL      COMPLICATIONS:   None.    SPECIMENS OBTAINED:  None.    IMPLANTS:  The implants used during the case include Simplex bone cement with tobramycin, aSmith and Nephew oval Genesis II patellar resurfacing component 36 mm in diameter, and a Legion high flex articular surface from [employer] size 5/6, 13 mm in height.      DRAINS:    No drains were left in place.    INDICATIONS FOR PROCEDURE:     The patient is a 58 year old female who had a total knee arthroplasty done on the right side about 18 months ago.  She complained of continued pain in that knee.  Her exam was consistent with some instability of the knee and scar tissue throughout the patellofemoral joint.  She had crepitus and grinding through range of motion.  She appeared to have overhang of the patella button with bony contact on the sunrise views.  We had a long discussion with her concerning options with the recommendation for revision of the total knee arthroplasty, specifically revision of the patella button, and possibly up sizing the articular surface.  She voiced understanding of this and agreed with that plan.  Risks and benefits of this were discussed.  These include but were not limited to infection, bleeding, DVT, pulmonary embolism, neurovascular injury, continued pain, possible future surgery and revision surgery.    DESCRIPTION OF  PROCEDURE:  After informed consent was obtained, the right knee was identified as the correct knee and signed.  An adductor canal block was placed in the holding area.  She was brought back to the operating room.  General anesthesia was administered and the right lower extremity was prepped and draped in the usual sterile fashion.  Surgical pause was held confirming the correct leg was being operated on.  Prophylactic antibiotics were given.  I was present throughout the entire case.  I used her previous incision, carried down through subcutaneous tissue to the level of extensor mechanism.  Medial parapatellar arthrotomy was created.  A medial release sufficient to expose the medial aspect of the tibia was performed.  She had extensive scar tissue in the retropatellar fat pad.  This included scarring to the notch.  This was subsequently taken down using electrocautery.  The medial gutter and suprapatellar pouch were reestablished as well as the lateral gutter.  She had scar tissue around within the lateral gutter.  The patella was noted to be overgrown with bone growing superior medial, superior lateral, and laterally.  The thickness of the patella was noted and the patellar button was removed with the assistance of a saw.  Care was taken to preserve as much bone as possible and, when we were done, 13 mm of  bone thickness was still in place.  Once all the plastic was removed from the patella, the patella was sized, noted to be a size 36, and an oval button was selected due to the better coverage of the patella.   Before drilling these hole, the knee was brought through a range of motion and noted to be loose in flexion, in extension felt to be reasonable.  Her 9 mm polyethylene was removed.  Upon doing so, we noted some osteophytes in the back in the posterior aspect of the femoral component.  These were taken down.  She had some scar tissue in the back of the knee and within the notch.  These were all taken down as  well.  After doing this, we were able to place an 11 mm insert and this was noted to have symmetric instability in both flexion and extension.   Upsized to a 13 and this gave [place] a better stability in both flexion and extension.  The debridement of the scar tissue was continued with debridement of scar tissue posteriorly and laterally until we were satisfied the entirety was gone.  She did have some excessive osteophyte on the lateral femoral condyle.  Both the femoral component and tibial components were well fixed to the bone.  The knee was then thoroughly irrigated.  The patella button holes were drilled and we cemented the patella on without difficulty.  Extruded cement was removed.  After the cement had hardened the 13 mm insert was called for and placed within the tibial tray.  It was noted to seat well.  Prior to doing so, Marcaine with epinephrine and Exparel were injected into the back of the knee for postop pain control.  IrriSept solution was used to thoroughly irrigate the wound and the arthrotomy was closed with #2 Stratafix, and  #1 PDS as well;  0 Vicryl, 2-0 Vicryl, and Monocryl were used on the skin.  A Prineo dressing was placed.  A sterile dressing placed on top of this.  She was extubated and brought to the recovery room in stable condition.      ___________________  Velna Hatchet MD  Dictated By:.   [facility]  D:03/21/2019 16:45:52  T: 03/21/2019 18:22:39  [id number]

## 2019-03-21 NOTE — Interval H&P Note (Signed)
Patient awaiting procedure. Resting comfortably. Awakes to voice stimuli. No signs of distress noted.

## 2019-03-21 NOTE — Interval H&P Note (Signed)
Dr. Phillis Knack at bedside speaking to patient regarding her block.

## 2019-03-21 NOTE — Progress Notes (Signed)
Dr. Glori Luis on call physician gave orders for oxycodone 10-15 mg q4 PRN. He said he is not going to increase the prn IV dilaudid dose.

## 2019-03-21 NOTE — Progress Notes (Signed)
Patient complaining that pain meds are not working, she stated that the doctor told her she was going to have 10-20mg  of oxycodone ordered, patient only has 5-10mg . Called and left message with the on call physician, awaiting for orders.

## 2019-03-21 NOTE — Op Note (Signed)
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      Procedure(s):  REVISION RIGHT TOTAL KNEE ARTHROPLASTY    Surgeon(s):  Orene Desanctis, MD    Surgical Assistant: None    Anesthesia: Other     Estimated Blood Loss (mL): 295 ml    Complications: None    Specimens: * No specimens in log *     Implants:   Implant Name Type Inv. Item Serial No. Manufacturer Lot No. LRB No. Used Action   CEMENT SIMPLEX P/TOBRAMYCIN - [id number] Cement CEMENT SIMPLEX P/TOBRAMYCIN  [employer] HOWM [id number] Right 1 Implanted   OVAL GENESIS II RESURFACING PATELLAR COMPONENT    [employer] [id number] Right 1 Implanted   COMP FERMORAL LEGION SZ 5-6 13MM HIGH FLEX - [id number] Joint Component COMP FERMORAL LEGION SZ 5-6 13MM HIGH FLEX  [employer] \\[employer] [id number] Right 1 Implanted       Drains: * No LDAs found *    Findings: bone overgrowth of patella and significant scar tissue.  Instability of the knee noted.     Electronically Signed by Orene Desanctis, MD on 03/21/2019 at 12:08 PM

## 2019-03-21 NOTE — Interval H&P Note (Signed)
 TRANSFER - OUT REPORT:    Verbal report given to Delon, RN(name) on Cassandra Daniels  being transferred to 2123(unit) for routine post - op       Report consisted of patient's Situation, Background, Assessment and   Recommendations(SBAR).     Information from the following report(s) SBAR, MAR and Cardiac Rhythm NSR was reviewed with the receiving nurse.    Opportunity for questions and clarification was provided.      Patient transported with:   [employer]

## 2019-03-21 NOTE — Anesthesia Post-Procedure Evaluation (Signed)
Procedure(s):  REVISION RIGHT TOTAL KNEE ARTHROPLASTY.    general    Anesthesia Post Evaluation      Multimodal analgesia: multimodal analgesia used between 6 hours prior to anesthesia start to PACU discharge  Patient location during evaluation: PACU  Patient participation: complete - patient participated  Level of consciousness: awake  Pain score: 8 (Variable from 0 to 8.)  Airway patency: patent  Anesthetic complications: no  Cardiovascular status: stable  Respiratory status: room air  Hydration status: stable  Comments: Continued to add iv opiates for post op pain. Variable pain scores from 0 to 8 with rapid response to narcotics. Continue to titrate.  Possibly we are just replacing patient's routing oral narcotics for her underlying fibromyalgia.  Behavior intraoperatively and in immediate post op period appears to indicate adequate response to local adjuncts.  Post anesthesia nausea and vomiting:  none  Final Post Anesthesia Temperature Assessment:  Normothermia (36.0-37.5 degrees C)      INITIAL Post-op Vital signs:   Vitals Value Taken Time   BP 120/76 03/21/2019  2:59 PM   Temp 36.6 ??C (97.8 ??F) 03/21/2019  1:50 PM   Pulse 87 03/21/2019  2:59 PM   Resp 16 03/21/2019  2:59 PM   SpO2 92 % 03/21/2019  2:59 PM

## 2019-03-21 NOTE — Care Coordination-Inpatient (Signed)
Nurse Navigator by Raliegh Ip, NP at 03/21/19 1539                Author: Raliegh Ip, NP  Service: Nurse Practitioner  Author Type: Nurse Practitioner       Filed: 03/21/19 [id number]  Date of Service: 03/21/19 1539  Status: Signed          Editor: Raliegh Ip, NP (Nurse Practitioner)                      [place] NAVIGATOR NOTE             Patient: Cassandra Daniels  Age: 58 y.o.  Sex: female          Date of Birth: Dec 31, 1960  Admit Date: 03/21/2019  PCP: Georgianne Fick, MD         MRN: [id number]   CSN: [id number]   LOS: 0 day(s)        Surgeon(s):   Orene Desanctis, MD    Procedure(s):   REVISION RIGHT TOTAL KNEE ARTHROPLASTY      Estimated Discharge date:  03/22/2019   Discharge Location:  Home        Subjective:     Patient Pain controlled           Objective          Vitals:             03/21/19 1414  03/21/19 1429  03/21/19 1444  03/21/19 1459           BP:  128/79  113/76  114/81  120/76     Pulse:  83  90  86  87     Resp:  16  18  14  16      Temp:             SpO2:  94%  91%  95%  92%     Weight:                   Height:                No results for input(s): WBC, HGB, HCT, MCV, PLT, HGBEXT, HCTEXT, PLTEXT in the last 72 hours.   Physical Exam:   General: Patient alert and oriented.    Respiratory: Respirations unlabored   Neurovascular/Extremities: NVI; Positive 2+ dorsalis pedal pulses and posterior tibial bilaterally.  Dressing:  clean, dry and intact.  PT:  pending           Education:     Reinforce ICS use 10 times every hour while awake and continue at home for 10 days.   Maintain a normal bowel movement naturally (i.e. Ambulation, fluid, prune juice or tea).  Eat a low-salt, healthy diet to help with healing and decrease swelling.  Ankle pumps 10 times an hour while awake.  DVT prophylaxis: Aspirin  TED  hose on for 6 weeks but may remove at night for hygiene.   Report signs and symptoms of blood clots (calf pain,  swelling, shortness of breath, cough).  Incision care : Remove Aquacel dressing in 7 days    Continue exercises as instructed by physical therapy.  Towel under her ankle if knee replacement  Follow-up appointment: 2 weeks   Continue home regimen for otherwise chronic, stable medical conditions as noted above.      All questions answered to the satisfaction of the patient/family  who also verbalized understanding    of above education.         Raliegh Ip, NP   March 21, 2019    3:40 PM

## 2019-03-21 NOTE — Progress Notes (Signed)
Problem: Falls - Risk of  Goal: *Absence of Falls  Description: Document Cassandra Daniels Fall Risk and appropriate interventions in the flowsheet.  Outcome: Progressing Towards Goal  Note: Fall Risk Interventions:                                Problem: Falls - Risk of  Goal: *Absence of Falls  Description: Document Cassandra Daniels Fall Risk and appropriate interventions in the flowsheet.  Outcome: Progressing Towards Goal  Note: Fall Risk Interventions:                                Problem: Patient Education: Go to Patient Education Activity  Goal: Patient/Family Education  Outcome: Progressing Towards Goal     Problem: Patient Education: Go to Patient Education Activity  Goal: Patient/Family Education  Outcome: Progressing Towards Goal

## 2019-03-21 NOTE — Anesthesia Procedure Notes (Signed)
Peripheral Block    Start time: 03/21/2019 8:30 AM  End time: 03/21/2019 8:48 AM  Performed by: Larry Sierras, MD  Authorized by: Larry Sierras, MD       Pre-procedure:   Indications: at surgeon's request and post-op pain management    Preanesthetic Checklist: patient identified, risks and benefits discussed, site marked, timeout performed, anesthesia consent given and patient being monitored    Timeout Time: 08:32          Block Type:   Block Type:  Adductor canal  Laterality:  Right  Monitoring:  Standard ASA monitoring, responsive to questions, oxygen, continuous pulse ox, frequent vital sign checks and heart rate  Injection Technique:  Single shot  Procedures: ultrasound guided    Patient Position: supine  Prep: alcohol and chlorhexidine    Location:  Lower thigh  Needle Type:  Stimuplex  Needle Gauge:  21 G  Needle Localization:  Ultrasound guidance    Assessment:  Number of attempts:  1  Injection Assessment:  Incremental injection every 5 mL, ultrasound image on chart, no paresthesia, negative aspiration for blood and no intravascular symptoms  Patient tolerance:  Patient tolerated the procedure well with no immediate complications

## 2019-03-21 NOTE — Op Note (Signed)
[place]  Inpatient Operation Report  NAME:  Cassandra Daniels  SEX:   F  DATE: 03/21/2019  DOB: June 05, 1961  MR#    [id number]  ROOM:  2123  ACCT#  [id number]        PREOPERATIVE DIAGNOSIS:  Painful right total knee arthroplasty.    POSTOPERATIVE DIAGNOSIS:  Painful right total knee arthroplasty.    OPERATION PERFORMED:  Revision of the right total knee arthroplasty.       SURGEON:    Orene Desanctis, MD    ASSISTANT:    Ubaldo Glassing.  Her role included opening, closing, and retraction throughout the case.    ANESTHESIA:  General anesthesia, with an adductor block.    ESTIMATED BLOOD LOSS:  425 mL      COMPLICATIONS:   None.    SPECIMENS OBTAINED:  None.    IMPLANTS:  The implants used during the case include Simplex bone cement with tobramycin, aSmith and Nephew oval Genesis II patellar resurfacing component 36 mm in diameter, and a Legion high flex articular surface from [employer] size 5/6, 13 mm in height.      DRAINS:    No drains were left in place.    INDICATIONS FOR PROCEDURE:     The patient is a 58 year old female who had a total knee arthroplasty done on the right side about 18 months ago.  She complained of continued pain in that knee.  Her exam was consistent with some instability of the knee and scar tissue throughout the patellofemoral joint.  She had crepitus and grinding through range of motion.  She appeared to have overhang of the patella button with bony contact on the sunrise views.  We had a long discussion with her concerning options with the recommendation for revision of the total knee arthroplasty, specifically revision of the patella button, and possibly up sizing the articular surface.  She voiced understanding of this and agreed with that plan.  Risks and benefits of this were discussed.  These include but were not limited to infection, bleeding, DVT, pulmonary embolism, neurovascular injury, continued pain, possible future surgery and revision surgery.     DESCRIPTION OF PROCEDURE:  After informed consent was obtained, the right knee was identified as the correct knee and signed.  An adductor canal block was placed in the holding area.  She was brought back to the operating room.  General anesthesia was administered and the right lower extremity was prepped and draped in the usual sterile fashion.  Surgical pause was held confirming the correct leg was being operated on.  Prophylactic antibiotics were given.  I was present throughout the entire case.  I used her previous incision, carried down through subcutaneous tissue to the level of extensor mechanism.  Medial parapatellar arthrotomy was created.  A medial release sufficient to expose the medial aspect of the tibia was performed.  She had extensive scar tissue in the retropatellar fat pad.  This included scarring to the notch.  This was subsequently taken down using electrocautery.  The medial gutter and suprapatellar pouch were reestablished as well as the lateral gutter.  She had scar tissue around within the lateral gutter.  The patella was noted to be overgrown with bone growing superior medial, superior lateral, and laterally.  The thickness of the patella was noted and the patellar button was removed with the assistance of a saw.  Care was taken to preserve as much bone as possible and, when we were done, 13 mm of  bone thickness was still in place.  Once all the plastic was removed from the patella, the patella was sized, noted to be a size 36, and an oval button was selected due to the better coverage of the patella.   Before drilling these hole, the knee was brought through a range of motion and noted to be loose in flexion, in extension felt to be reasonable.  Her 9 mm polyethylene was removed.  Upon doing so, we noted some osteophytes in the back in the posterior aspect of the femoral component.  These were taken down.  She had some scar tissue  in the back of the knee and within the notch.  These were all taken down as well.  After doing this, we were able to place an 11 mm insert and this was noted to have symmetric instability in both flexion and extension.   Upsized to a 13 and this gave [place] a better stability in both flexion and extension.  The debridement of the scar tissue was continued with debridement of scar tissue posteriorly and laterally until we were satisfied the entirety was gone.  She did have some excessive osteophyte on the lateral femoral condyle.  Both the femoral component and tibial components were well fixed to the bone.  The knee was then thoroughly irrigated.  The patella button holes were drilled and we cemented the patella on without difficulty.  Extruded cement was removed.  After the cement had hardened the 13 mm insert was called for and placed within the tibial tray.  It was noted to seat well.  Prior to doing so, Marcaine with epinephrine and Exparel were injected into the back of the knee for postop pain control.  IrriSept solution was used to thoroughly irrigate the wound and the arthrotomy was closed with #2 Stratafix, and  #1 PDS as well;  0 Vicryl, 2-0 Vicryl, and Monocryl were used on the skin.  A Prineo dressing was placed.  A sterile dressing placed on top of this.  She was extubated and brought to the recovery room in stable condition.      ___________________  Velna Hatchet MD  Dictated By:.   [facility]  D:03/21/2019 16:45:52  T: 03/21/2019 18:22:39  [id number]

## 2019-03-21 NOTE — Brief Op Note (Signed)
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      Procedure(s):  REVISION RIGHT TOTAL KNEE ARTHROPLASTY    Surgeon(s):  Orene Desanctis, MD    Surgical Assistant: None    Anesthesia: Other     Estimated Blood Loss (mL): 053 ml    Complications: None    Specimens: * No specimens in log *     Implants:   Implant Name Type Inv. Item Serial No. Manufacturer Lot No. LRB No. Used Action   CEMENT SIMPLEX P/TOBRAMYCIN - [id number] Cement CEMENT SIMPLEX P/TOBRAMYCIN  [employer] HOWM [id number] Right 1 Implanted   OVAL GENESIS II RESURFACING PATELLAR COMPONENT    [employer] [id number] Right 1 Implanted   COMP FERMORAL LEGION SZ 5-6 13MM HIGH FLEX - [id number] Joint Component COMP FERMORAL LEGION SZ 5-6 13MM HIGH FLEX  [employer] [id number] Right 1 Implanted       Drains: * No LDAs found *    Findings: bone overgrowth of patella and significant scar tissue.  Instability of the knee noted.     Electronically Signed by Orene Desanctis, MD on 03/21/2019 at 12:08 PM

## 2019-03-21 NOTE — Other (Signed)
Dr. Phillis Knack at bedside speaking to patient regarding her block.

## 2019-03-21 NOTE — Other (Signed)
TRANSFER - OUT REPORT:    Verbal report given to Anderson Malta, RN(name) on Cassandra Daniels  being transferred to 2123(unit) for routine post - op       Report consisted of patient???s Situation, Background, Assessment and   Recommendations(SBAR).     Information from the following report(s) SBAR, MAR and Cardiac Rhythm NSR was reviewed with the receiving nurse.    Opportunity for questions and clarification was provided.      Patient transported with:   [employer]

## 2019-03-21 NOTE — Nurse Consult (Signed)
[facility]  NURSE PRACTITIONER NAVIGATOR NOTE      Patient: Cassandra Daniels Age: 58 y.o. Sex: female    Date of Birth: 03-22-1961 Admit Date: 03/21/2019 PCP: Georgianne Fick, MD   MRN: [id number]  CSN: [id number]  LOS: 0 day(s)     Surgeon(s):  Orene Desanctis, MD   Procedure(s):  REVISION RIGHT TOTAL KNEE ARTHROPLASTY    Estimated Discharge date:  03/22/2019  Discharge Location:  home    Subjective:   Patient Met in SAU, questions answered      Objective     Vitals:    03/21/19 1246 03/21/19 1249 03/21/19 1300 03/21/19 1315   BP: (!) 148/92 (!) 148/92 (!) 153/93 143/83   Pulse: 97 97 96 93   Resp: _0 Temp: 97.5 ??F (36.4 ??C)      SpO2: 95% 95% 98% 98%   Weight:       Height:             Raliegh Ip, NP  March 21, 2019   1:38 PM

## 2019-03-21 NOTE — Nurse Consult (Signed)
[facility]  NURSE PRACTITIONER NAVIGATOR NOTE      Patient: Cassandra Daniels Age: 58 y.o. Sex: female    Date of Birth: 1960/09/09 Admit Date: 03/21/2019 PCP: Georgianne Fick, MD   MRN: [id number]  CSN: [id number]  LOS: 0 day(s)     Surgeon(s):  Orene Desanctis, MD   Procedure(s):  REVISION RIGHT TOTAL KNEE ARTHROPLASTY    Estimated Discharge date:  03/22/2019  Discharge Location:  Home    Subjective:   Patient Pain controlled      Objective     Vitals:    03/21/19 1414 03/21/19 1429 03/21/19 1444 03/21/19 1459   BP: 128/79 113/76 114/81 120/76   Pulse: 83 90 86 87   Resp: 16 18 14 16    Temp:       SpO2: 94% 91% 95% 92%   Weight:       Height:         No results for input(s): WBC, HGB, HCT, MCV, PLT, HGBEXT, HCTEXT, PLTEXT in the last 72 hours.  Physical Exam:  General: Patient alert and oriented.    Respiratory: Respirations unlabored  Neurovascular/Extremities: NVI; Positive 2+ dorsalis pedal pulses and posterior tibial bilaterally.  Dressing: clean, dry and intact.  PT:  pending      Education:   Reinforce ICS use 10 times every hour while awake and continue at home for 10 days.  Maintain a normal bowel movement naturally (i.e. Ambulation, fluid, prune juice or tea).  Eat a low-salt, healthy diet to help with healing and decrease swelling.  Ankle pumps 10 times an hour while awake.  DVT prophylaxis: Aspirin  TED hose on for 6 weeks but may remove at night for hygiene.  Report signs and symptoms of blood clots (calf pain, swelling, shortness of breath, cough).  Incision care : Remove Aquacel dressing in 7 days   Continue exercises as instructed by physical therapy.  Towel under her ankle if knee replacement  Follow-up appointment: 2 weeks  Continue home regimen for otherwise chronic, stable medical conditions as noted above.    All questions answered to the satisfaction of the patient/family who also verbalized understanding   of above education.      Raliegh Ip, NP   March 21, 2019   3:40 PM

## 2019-03-21 NOTE — Other (Signed)
Patient awaiting procedure. Resting comfortably. Awakes to voice stimuli. No signs of distress noted.

## 2019-03-22 LAB — CBC WITH AUTOMATED DIFF
BASOPHILS: 0.2 % (ref 0–3)
EOSINOPHILS: 0.1 % (ref 0–5)
HCT: 38.6 % (ref 37.0–50.0)
HGB: 12.2 gm/dl — ABNORMAL LOW (ref 13.0–17.2)
IMMATURE GRANULOCYTES: 0.3 % (ref 0.0–3.0)
LYMPHOCYTES: 22.6 % — ABNORMAL LOW (ref 28–48)
MCH: 29.5 pg (ref 25.4–34.6)
MCHC: 31.6 gm/dl (ref 30.0–36.0)
MCV: 93.5 fL (ref 80.0–98.0)
MONOCYTES: 10 % (ref 1–13)
MPV: 10.8 fL — ABNORMAL HIGH (ref 6.0–10.0)
NEUTROPHILS: 66.8 % — ABNORMAL HIGH (ref 34–64)
NRBC: 0 (ref 0–0)
PLATELET: 232 10*3/uL (ref 140–450)
RBC: 4.13 M/uL (ref 3.60–5.20)
RDW-SD: 51.7 — ABNORMAL HIGH (ref 36.4–46.3)
WBC: 8.8 10*3/uL (ref 4.0–11.0)

## 2019-03-22 LAB — METABOLIC PANEL, BASIC
Anion gap: 5 mmol/L (ref 5–15)
BUN: 10 mg/dl (ref 7–25)
CO2: 29 mEq/L (ref 21–32)
Calcium: 8.6 mg/dl (ref 8.5–10.1)
Chloride: 107 mEq/L (ref 98–107)
Creatinine: 0.9 mg/dl (ref 0.6–1.3)
GFR est AA: >60
GFR est non-AA: >60
Glucose: 99 mg/dl (ref 74–106)
Potassium: 4.1 mEq/L (ref 3.5–5.1)
Sodium: 140 mEq/L (ref 136–145)

## 2019-03-22 LAB — BASIC METABOLIC PANEL
Anion Gap: 5 mmol/L (ref 5–15)
BUN: 10 mg/dl (ref 7–25)
CO2: 29 mEq/L (ref 21–32)
Calcium: 8.6 mg/dl (ref 8.5–10.1)
Chloride: 107 mEq/L (ref 98–107)
Creatinine: 0.9 mg/dl (ref 0.6–1.3)
EGFR IF NonAfrican American: >60
GFR African American: >60
Glucose: 99 mg/dl (ref 74–106)
Potassium: 4.1 mEq/L (ref 3.5–5.1)
Sodium: 140 mEq/L (ref 136–145)

## 2019-03-22 LAB — CBC WITH AUTO DIFFERENTIAL
Basophils %: 0.2 % (ref 0–3)
Eosinophils %: 0.1 % (ref 0–5)
Hematocrit: 38.6 % (ref 37.0–50.0)
Hemoglobin: 12.2 gm/dl — ABNORMAL LOW (ref 13.0–17.2)
Immature Granulocytes: 0.3 % (ref 0.0–3.0)
Lymphocytes %: 22.6 % — ABNORMAL LOW (ref 28–48)
MCH: 29.5 pg (ref 25.4–34.6)
MCHC: 31.6 gm/dl (ref 30.0–36.0)
MCV: 93.5 fL (ref 80.0–98.0)
MPV: 10.8 fL — ABNORMAL HIGH (ref 6.0–10.0)
Monocytes %: 10 % (ref 1–13)
Neutrophils %: 66.8 % — ABNORMAL HIGH (ref 34–64)
Nucleated RBCs: 0 (ref 0–0)
Platelets: 232 10*3/uL (ref 140–450)
RBC: 4.13 M/uL (ref 3.60–5.20)
RDW-SD: 51.7 — ABNORMAL HIGH (ref 36.4–46.3)
WBC: 8.8 10*3/uL (ref 4.0–11.0)

## 2019-03-22 MED ORDER — RIVAROXABAN 10 MG TAB
10 mg | Freq: Every day | ORAL | Status: DC
Start: 2019-03-22 — End: 2019-03-23
  Administered 2019-03-22: 22:00:00 via ORAL

## 2019-03-22 MED FILL — OXYCODONE 5 MG TAB: 5 mg | ORAL | Qty: 4

## 2019-03-22 MED FILL — MAPAP EXTRA STRENGTH 500 MG TABLET: 500 mg | ORAL | Qty: 2

## 2019-03-22 MED FILL — OXYCONTIN 10 MG TABLET,CRUSH RESISTANT,EXTENDED RELEASE: 10 mg | ORAL | Qty: 1

## 2019-03-22 MED FILL — VENLAFAXINE SR 150 MG 24 HR CAP: 150 mg | ORAL | Qty: 1

## 2019-03-22 MED FILL — OXYCODONE 5 MG TAB: 5 mg | ORAL | Qty: 3

## 2019-03-22 MED FILL — DOCUSATE SODIUM 100 MG CAP: 100 mg | ORAL | Qty: 1

## 2019-03-22 MED FILL — METFORMIN 500 MG TAB: 500 mg | ORAL | Qty: 1

## 2019-03-22 MED FILL — PANTOPRAZOLE 40 MG TAB, DELAYED RELEASE: 40 mg | ORAL | Qty: 1

## 2019-03-22 MED FILL — ASPIRIN 325 MG TAB, DELAYED RELEASE: 325 mg | ORAL | Qty: 1

## 2019-03-22 MED FILL — HYDROMORPHONE 1 MG/ML INJECTION SOLUTION: 1 mg/mL | INTRAMUSCULAR | Qty: 1

## 2019-03-22 MED FILL — ACETAMINOPHEN 500 MG TAB: 500 mg | ORAL | Qty: 2

## 2019-03-22 MED FILL — OFIRMEV 1,000 MG/100 ML (10 MG/ML) INTRAVENOUS SOLUTION: 1000 mg/100 mL (10 mg/mL) | INTRAVENOUS | Qty: 100

## 2019-03-22 MED FILL — GABAPENTIN 300 MG CAP: 300 mg | ORAL | Qty: 1

## 2019-03-22 MED FILL — XARELTO 10 MG TABLET: 10 mg | ORAL | Qty: 1

## 2019-03-22 MED FILL — ZOLPIDEM 5 MG TAB: 5 mg | ORAL | Qty: 1

## 2019-03-22 MED FILL — PRIMIDONE 50 MG TAB: 50 mg | ORAL | Qty: 2

## 2019-03-22 MED FILL — GABAPENTIN 300 MG CAP: 300 mg | ORAL | Qty: 2

## 2019-03-22 NOTE — Care Coordination-Inpatient (Signed)
Nurse Navigator by Raliegh Ip, NP at 03/22/19 [phone]                Author: Raliegh Ip, NP  Service: Nurse Practitioner  Author Type: Nurse Practitioner       Filed: 03/22/19 1421  Date of Service: 03/22/19 0854  Status: Addendum          Editor: Raliegh Ip, NP (Nurse Practitioner)          Related Notes: Original Note by Raliegh Ip, NP (Nurse Practitioner) filed at 03/22/19  [facility] NOTE             Patient: Cassandra Daniels  Age: 58 y.o.  Sex: female          Date of Birth: February 09, 1961  Admit Date: 03/21/2019  PCP: Georgianne Fick, MD         MRN: [id number]   CSN: [id number]   LOS: 1 day(s)        Surgeon(s):   Orene Desanctis, MD    Procedure(s):   REVISION RIGHT TOTAL KNEE ARTHROPLASTY      Estimated Discharge date:  03/23/2019   Discharge Location:  home        Subjective:     Patient Progressing well           Objective          Vitals:             03/21/19 2021  03/21/19 2348  03/22/19 0404  03/22/19 0725           BP:  138/63  113/61  129/76  117/68     Pulse:  (!) 104  96  83  97     Resp:  18  16  16  18      Temp:  98.9 ??F (37.2 ??C)  98.4 ??F (36.9 ??C)  98.6 ??F (37 ??C)  98.1 ??F (36.7 ??C)     SpO2:  98%  100%  98%  98%     Weight:                   Height:                  Recent Labs           03/22/19   0640     WBC  8.8     HGB  12.2*     HCT  38.6     MCV  93.5        PLT  232        Physical Exam:   General: Patient alert and oriented.    Respiratory: Respirations unlabored   Neurovascular/Extremities: NVI; Positive 2+ dorsalis pedal pulses and posterior tibial bilaterally.  Dressing:  clean, dry and intact.  PT:  Up walking in hall           Education:     Reinforce ICS use 10 times every hour while awake and continue at home for 10 days.   Maintain a normal bowel movement naturally (i.e. Ambulation, fluid, prune juice or tea).  Eat a low-salt, healthy diet to help with healing  and decrease swelling.  Ankle pumps 10 times an hour while awake.  DVT prophylaxis: Xarelto  TED  hose on for 6  weeks but may remove at night for hygiene.   Report signs and symptoms of blood clots (calf pain, swelling, shortness of breath, cough).  Incision care : Remove Aquacel dressing in 7 days    Continue exercises as instructed by physical therapy.  Towel under her ankle if knee replacement  Follow-up appointment: 2 weeks   Continue home regimen for otherwise chronic, stable medical conditions as noted above.      All questions answered to the satisfaction of the patient/family who also verbalized understanding    of above education.         Raliegh Ip, NP   March 22, 2019    8:55 AM

## 2019-03-22 NOTE — Progress Notes (Signed)
Orthopaedics Daily Progress Note    Patient without new complaints status post Procedure(s):  REVISION RIGHT TOTAL KNEE ARTHROPLASTY 03/21/2019 .  No new changes. Pain control improving     Patient Vitals for the past 24 hrs:   BP Temp Pulse Resp SpO2   03/22/19 0725 117/68 98.1 ??F (36.7 ??C) 97 18 98 %   03/22/19 0404 129/76 98.6 ??F (37 ??C) 83 16 98 %   03/21/19 2348 113/61 98.4 ??F (36.9 ??C) 96 16 100 %   03/21/19 2021 138/63 98.9 ??F (37.2 ??C) (!) 104 18 98 %   03/21/19 1830 130/73 97.7 ??F (36.5 ??C) 95 18 100 %   03/21/19 1637 116/70 97.4 ??F (36.3 ??C) 91 16 96 %   03/21/19 1459 120/76 ??? 87 16 92 %   03/21/19 1444 114/81 ??? 86 14 95 %   03/21/19 1429 113/76 ??? 90 18 91 %   03/21/19 1414 128/79 ??? 83 16 94 %   03/21/19 1400 133/82 ??? 89 15 93 %   03/21/19 1350 ??? 97.8 ??F (36.6 ??C) ??? ??? ???   03/21/19 1345 127/78 ??? 87 14 93 %   03/21/19 1330 141/72 ??? 84 16 92 %   03/21/19 1315 143/83 ??? 93 13 98 %   03/21/19 1300 (!) 153/93 ??? 96 17 98 %   03/21/19 1249 (!) 148/92 ??? 97 15 95 %   03/21/19 1246 (!) 148/92 97.5 ??F (36.4 ??C) 97 21 95 %   03/21/19 0916 130/83 ??? 86 15 98 %   03/21/19 0902 136/82 ??? 87 15 99 %           Physical exam:  Right Lower extremity: Dressing dry.  NVI Bilateral Lower Extremities.   2+ DP pulse, motor 5/5 TA/GSC, SILT SP/DP/Tib nerves. Calves soft and nontender.       Assessment:  Status post Procedure(s):  REVISION RIGHT TOTAL KNEE ARTHROPLASTY 03/21/2019,  Progressing as expected.        PLAN:  Mobilize with P.T./OT.   Pain management with Oxycontin BID and oxy IR q4 prn.  ASA BID for DVT prophylaxis.  Discharge Planning for discharge on today or tomorrow to home with home health.      Orene Desanctis, MD  03/22/2019  7:50 AM

## 2019-03-22 NOTE — Progress Notes (Signed)
Problem: Falls - Risk of  Goal: *Absence of Falls  Description: Document Cassandra Daniels Fall Risk and appropriate interventions in the flowsheet.  Outcome: Progressing Towards Goal  Note: Fall Risk Interventions:  Mobility Interventions: Bed/chair exit alarm, Patient to call before getting OOB, PT Consult for mobility concerns, Utilize walker, cane, or other assistive device         Medication Interventions: Bed/chair exit alarm, Patient to call before getting OOB, Teach patient to arise slowly    Elimination Interventions: Bed/chair exit alarm, Call light in reach, Patient to call for help with toileting needs, Toileting schedule/hourly rounds

## 2019-03-22 NOTE — Progress Notes (Signed)
 PT goals:     1. Pt will be independent with bed mobility  in preparation for OOB activities  2. Pt will be independent to transfer with in preparation for OOB activities and ambulation.   3. Pt will be independent to ambulate a distance of 200 feet using least restrictive device to promote functional independence  4. Pt will be independent to ascend/descend 4 steps plus rail  for safe discharge to home environment  5. Pt will be independent with LE exercises x 10-15 reps each to increase strength and endurance  6. Increase strength RLE by 1/2 to1 grade higher to promote functional independence, improve ADLs/mobility and reduce risk for falls  7. Improve dynamic standing balance to good with AD to promote functional independence, improve ADLs/mobility and reduce risk for falls  8. Tolerate OOB to chair for all meals  9. Patient will demonstrate good activity tolerance during functional activities.   10. Patient will state/observe falls and R TKR precautions  11. Increase ROM R knee 0-90 degrees by POD3        PHYSICAL THERAPY TREATMENT       Patient: Cassandra Daniels (58 y.o. 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 TKR precautions  Weight bearing precautions: WBAT and RLE    Schmid Fall Risk: Total Score: 3 (03/22/19 0758)      Orders reviewed, chart reviewed, and PT treatment completed on [place] SHAUNNA Shape.  Discussed with nursing.    ASSESSMENT :  Based on the objective data described below, the patient presents with    - Increased tolerance to activity despite c/o R knee pain  - SBA x 1 with bed mobility  - SBA x 1 with sit to stand/stand to sit with RW  - SBA  x 1 to ambulate a distance of 100 feet using RW   - SBA with transfer to and from toilet, using RW  - fair minus tolerance with LE exercises d/t pain  - shows motivation to participate with PT despite c/o R knee pain.  - progressing towards goals         Patient will benefit from skilled  Physical Therapy intervention to address the above impairments.    Patient's rehabilitation potential is considered to be Good       PLAN :  Planned Interventions  Functional mobility training Gait Training Stair training Balance Training Therapeutic exercises Therapeutic activities AD training Patient/caregiver education    Frequency/Duration:  Patient will benefit from skilled Physical Therapy intervention to address the above impairments to return to prior level of function. Patient will be seen BID for 2 weeks.     Recommendations:  Recommend continued physical therapy during acute stay. Occupational Therapy. Recommend out of bed activity to counteract ill effects of bedrest, with assistance from staff as needed and patient is able to actively participate.      Home with family/caregiver support and Pt/family plan on going home    Discharge Recommendations: Home with home health PT  Further Equipment Recommendations for Discharge: Rolling walker          SUBJECTIVE:  Patient: It really hurt when I was bending my knee earlier.    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) ICD-10-CM: T84.84XA, Z96.651  ICD-9-CM: 996.77, V43.65  03/21/2019        Rectocele ICD-10-CM: N81.6  ICD-9-CM: 618.04  07/09/2016        Chronic pain of right knee ICD-10-CM: M25.561, G89.29  ICD-9-CM: 719.46, 338.29  07/26/2015        Primary osteoarthritis of right knee ICD-10-CM: M17.11  ICD-9-CM: 715.16  07/26/2015        Primary osteoarthritis of first carpometacarpal joint of right hand ICD-10-CM: M18.11  ICD-9-CM: 715.14   05/17/2015        Cubital tunnel syndrome ICD-10-CM: G56.20  ICD-9-CM: 354.2  05/17/2015        Carpal tunnel syndrome of right wrist ICD-10-CM: G56.01  ICD-9-CM: 354.0  05/17/2015        Right shoulder pain ICD-10-CM: M25.511  ICD-9-CM: 719.41  Unknown        Right hip pain ICD-10-CM: M25.551  ICD-9-CM: 719.45  Unknown        Left groin pain ICD-10-CM: R10.32  ICD-9-CM: 789.04  Unknown        Cerebral meningioma (HCC) ICD-10-CM: D32.0  ICD-9-CM: 225.2  01/13/2012        History of Roux-en-Y gastric bypass ICD-10-CM: Z98.84  ICD-9-CM: V45.86  07/25/2011        Screening breast examination and mammography ICD-10-CM: Z12.39  ICD-9-CM: V76.10  Unknown        Palpitations ICD-10-CM: R00.2  ICD-9-CM: 785.1  05/09/2010        Other dyspnea and respiratory abnormality ICD-10-CM: R06.09, R09.89  ICD-9-CM: 786.09  05/09/2010        PAT (paroxysmal atrial tachycardia) (HCC) ICD-10-CM: I47.1  ICD-9-CM: 427.0  11/08/2009        Meningioma nos ICD-10-CM: M9530/0  ICD-9-CM: M9530/0  Unknown        UTI (lower urinary tract infection) ICD-10-CM: N39.0  ICD-9-CM: 599.0  12/19/2008        Sore throat ICD-10-CM: J02.9  ICD-9-CM: 462  12/19/2008             Past Medical history:   Past Medical History:   Diagnosis Date   . Adverse effect of anesthesia     slow to come out of anesthesia.   . Arthritis    . Atherosclerotic cardiovascular disease    . Bilateral knee pain    . Broken nose 1999    surgery   . Carpal tunnel syndrome    . Chronic fatigue    . Fibrocystic breast     bilateral    . Fibromyalgia    . Gastric bypass status for obesity 2009   . Left groin pain    . Left hip pain    . Meningioma nos    . MVP (mitral valve prolapse)    . Pain management    . Pancreatitis 2014   . Paroxysmal atrial tachycardia (HCC)    . Paroxysmal tachycardia (HCC)    . PVC (premature ventricular contraction)    . Right hand pain    . Right hip pain    . Right shoulder pain    . Unspecified adverse effect of anesthesia     difficulty waking up    .  Unspecified sleep apnea     does not use cpap   . Vitamin B 12 deficiency        PRIOR LEVEL OF FUNCTION / LIVING SITUATION (from PT eval note)     Information was obtained by:   patient  Home environment:   Lives with Spouse, House, 1 story, 4 steps plusa rail on entrance  Prior level of function:   independent without limitations  Home equipment: None      Patient found: Bed and ice pack on R knee.    Pain Assessment before PT session: 8/10  Pain Location:  R knee  Pain Assessment after PT session: 8/10  Pain Location:  R knee  []           Yes, patient had pain medications  []           No, Patient has not had pain medications  []           Nurse notified    COGNITIVE STATUS:     Patient is alert, oriented x3 with no cognitive deficits      EXTREMITIES ASSESSMENT:      Range Of Motion:  Pt declined ROM and measurement of R knee d/t pain, R knee flexion 0-65 degrees during first PT session.     Functional mobility and balance status:     Mobility:  Supine to sit -  standby assist  Sit to Supine -  standby assist  Sit to Stand -  standby assist and RW  Stand to Sit -  standby assist    Transfers:  Transfer to and from toilet with SBA x 1.   - SBA x 1 for hygiene activity with pt standing in front of sink      Balance:  Static Sitting Balance -  good  Dynamic Sitting Balance -  good-  Static Standing Balance -  fair+  Dynamic Standing Balance -  fair    Sitting Static/Dynamic   Normal static: patient able to maintain steady balance without handhold support   Normal dynamic: patient accepts maximal challenge and can shift weight easily within full range in all directions  Good static: patient able to maintain balance without handhold support, limited postural sway  Good dynamic: patient accepts moderate challenge  Fair static: patient able to maintain balance with handhold support; requires occasional minimal assistance  Fair dynamic: patient accepts minimal challenge; able to maintain balance while turning  head/trunk  Poor static: patient requires handhold support and moderate to maximal assistance to maintain position  Poor dynamic: patient unable to accept challenge or move without loss of balance         Activity Tolerance: fair+.    Ambulation/Gait Training:  steady, step to and antalgic Rolling walker standby assist   100 feet    Stair Training:  not tested      THERAPEUTIC EXERCISES       Supine         EXERCISE    Sets    Reps    Active  Active Assist    Passive  Self ROM    Comments    Heel/Toe raises  2 10 X    Verbal cues   Quad Sets/Glut Sets  2 10 X    Verbal cues   Hamstring Sets           Marching          Heel Slides        Pt declined d/t  pain R knee   Straight Leg Raises  2 10 X X   Verbal cues   Hip Abd/Add  2 10 X    Verbal cues   Long Arc Quads           Mini squats                Activity Tolerance:   fair and pain limiting    Final Location:   bed, all needs close, agrees to call for assistance, nurse notified  and ioce pack on R knee    COMMUNICATION/EDUCATION:     Barriers to Learning/Limitations:  None  Education provided patient on  Patient, Benefit of activity while hospitalized, Call for assistance, Out of bed 2-3 times/day, Staff assistance with mobility, Changes positions frequently, Sit out of bed for 45-60 minutes or as tolerated, HEP, Safety, Demonstrates adequately, Verbalized understanding, Teaching method, Verbal, Written and Role of PT    COMMUNICATION/EDUCATION:   [x]          Fall prevention education was provided and the patient/caregiver indicated understanding.  [x]          Patient/family have participated as able in goal setting and plan of care.  [x]          Patient/family agree to work toward stated goals and plan of care.  []          Patient understands intent and goals of therapy, but is neutral about his/her participation.  []          Patient is unable to participate in goal setting and plan of care  Thank you for this referral.  Joanalyne A. Ramiro, PT  Pager # :  [phone]

## 2019-03-22 NOTE — Progress Notes (Signed)
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     1. I certify that the patient needs intermittent care as follows: physical therapy: strengthening, stretching/ROM and transfer training    2. I certify that this patient is homebound, that is: 1) patient requires the use of a walker device, special transportation, or assistance of another to leave the home; or 2) patient's condition makes leaving the home medically contraindicated; and 3) patient has a normal inability to leave the home and leaving the home requires considerable and taxing effort.  Patient may leave the home for infrequent and short duration for medical reasons, and occasional absences for non-medical reasons. Homebound status is due to the following functional limitations: Patient's ambulation limited secondary to severe pain and requires the use of an assistive device and the assistance of a caregiver for safe completion.  Patient with strength and ROM deficits limiting ambulation endurance requiring the  use of an assistive device and the assistance of a caregiver.  Patient deemed temporarily homebound secondary to increased risk for infection when leaving home and going out into the community.  Patient currently under activity restrictions secondary to recent surgical procedure, this hinders their ability to safely leave the home.    3. I certify that this patient is under my care and that I, or a nurse practitioner or physician's assistant, or clinical nurse specialist, or certified nurse midwife, working with me, had a Face-to-Face Encounter that meets the physician Face-to-Face Encounter requirements.  The following are the clinical findings from the Face-to-Face encounter that support the need for skilled services and is a summary of the encounter: see D/C summary      See discharge summary    Electronically signed:  Kyla Balzarine, RN  03/22/2019      THE FOLLOWING TO BE COMPLETED BY THE  PHYSICIAN:    I concur with the findings described above from the F2F encounter that this patient is homebound and in need of a skilled service.    Electronically signed:  Certifying Physician:

## 2019-03-22 NOTE — Progress Notes (Signed)
DME- Wheeled walker needed - CM notified [facility] will deliver prior to D/C  [facility]  Office: [phone]   Cell: [phone]

## 2019-03-22 NOTE — Progress Notes (Signed)
 OCCUPATIONAL THERAPY EVALUATION     Patient: Cassandra Daniels (58 y.o. 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) [T84.84XA, Z96.651]   Procedure(s) (LRB):  REVISION RIGHT TOTAL KNEE ARTHROPLASTY (Right) 1 Day Post-Op  Date of Admission: 03/21/2019   Length of Stay:  1 day(s)  Insurance: Payor: OPTIMA / Plan: [facility] OPTIMA HMO / Product Type: Commerical /      Date: 03/22/2019  In time:  1346        Out time:  1410    Precautions:Falls, KNEE PRECAUTIONS: do not put anything under the knee and do not twist the knee   Ordered Weight Bearing Status: WBAT, right lower extremity    Isolation:  There are currently no Active Isolations       MDRO: No active infections        ASSESSMENT :   Based on the objective data described below, the patient presents with   - generalized muscle weakness affecting function in ADLs  - bilateral upper extremity and bilateral lower extremity weakness  - decreased functional standing balance    - decreased functional mobility   - decreased tolerance to sustained activity  - increased pain   affecting patient's safety and independence/ability to perform basic ADLs/IADLs    Patient will benefit from skilled occupational therapy intervention to address the above impairments.    Patient's rehabilitation potential is considered to be Good for below stated goals.     Recommendations:  Recommend continued occupational therapy during acute stay. Recommend out of bed activity to counteract ill effects of bedrest, with assistance from staff as needed.  Discharge Recommendations: Home health OT  Further Equipment Recommendations for Discharge: rolling walker     Plan:   Patient will be followed by occupational therapy to address goals while hospitalized as patient's status and schedule permit.   Patient to be seen 1-5x/week x 2  weeks.    Planned interventions may include any combination of the following: Adaptive equipment, ADI training, activity tolerance, functional balance training, functional mobility training, therapeutic exercise, therapeutic activity, patient/caregiver education and training, home exercise program, neuromuscular re-education and energy conservation    Patient and/or family have participated as able in goal setting and plan of care.  Occupational Therapy goals:     OT goals initiated 03/22/2019 and will be met by patient within 1-2 weeks in order to promote maximal independence in ADLs/IADLs.    Pt will be modified independent with LB dressing sitting EOB/bedside chair w/ AE as needed.   Pt will be modified independent with toilet transfer.   Pt will be Modified independent with move from supine to sit and sit to supine  in bed.   Pt will increase static and dynamic standing balance to good during functional ADL tasks.   Pt will tolerate standing for 5 minutes to promote maximal independence in ADLs.   Pt will increase bilateral  upper extremity strength to 5/5.    Education/ communication:     Barriers to Learning/Limitations:  None  Education provided to: patient on (+) role of OT, (+) OT plan of care, (+) instructed patient on the importance of activity while hospitalized to prevent a decline in function, (+) encouraged patient to sit up in chair for 45 (+) minutes or as tolerated 2-3 times a day, with staff assistance as needed, (+)  ADL training  Educational Handouts issued: none this session  Patient / Family readiness to learn indicated by: verbalized understanding and demonstrated understanding    SUBJECTIVE:     Patient I'm good, this isn't my first surgery    Pain Assessment: 4 / 10, location: R knee     OBJECTIVE DATA SUMMARY:     Orders, labs, and chart reviewed on Cassandra Daniels. Communicated with RN    Patient was admitted to the hospital on 03/21/2019 with No chief complaint on file.    Present  illness history:   Patient Active Problem List    Diagnosis Date Noted   . 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 Rectocele 07/09/2016   . Chronic pain of right knee 07/26/2015   . Primary osteoarthritis of right knee 07/26/2015   . Primary osteoarthritis of first carpometacarpal joint of right hand 05/17/2015   . Cubital tunnel syndrome 05/17/2015   . Carpal tunnel syndrome of right wrist 05/17/2015   . Right shoulder pain    . Right hip pain    . Left groin pain    . Cerebral meningioma (HCC) 01/13/2012   . 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 Screening breast examination and mammography    . Palpitations 05/09/2010   . Other dyspnea and respiratory abnormality 05/09/2010   . PAT (paroxysmal atrial tachycardia) (HCC) 11/08/2009   . Meningioma nos    . UTI (lower urinary tract infection) 12/19/2008   . Sore throat 12/19/2008      Previous medical history:   Past Medical History:   Diagnosis Date   . Adverse effect of anesthesia     slow to come out of anesthesia.   . Arthritis    . Atherosclerotic cardiovascular disease    . Bilateral knee pain    . Broken nose 1999    surgery   . Carpal tunnel syndrome    . Chronic fatigue    . Fibrocystic breast     bilateral    . Fibromyalgia    . Gastric bypass status for obesity 2009   . Left groin pain    . Left hip pain    . Meningioma nos    . MVP (mitral valve prolapse)    . Pain management    . Pancreatitis 2014   . Paroxysmal atrial tachycardia (HCC)    . Paroxysmal tachycardia (HCC)    . PVC (premature ventricular contraction)    . Right hand pain    . Right hip pain    . Right shoulder pain    . Unspecified adverse effect of anesthesia     difficulty waking up    . Unspecified sleep apnea     does not use cpap   . Vitamin B 12 deficiency        Patient found:     Bedside chair, ice to RLE.    Patient received / participated in 8 minutes of treatment (ADI retraining) and/or educational instruction during/immediately following OT  evaluation    Prior level of function / living situation status:     Information was obtained by:   patient  Home environment:   Patient lives with spouse in a 1 story house 3STE.     Prior level of function:Patient is independent with no limitations.  Prior level of Instrumental Activities of Daily Living:   Patient is independent with all IADLs.  Home equipment: none reported  Cognitive status:     Mental status:   Orientation: Patient is oriented to person,place, month and year  Communication: normal  Attention Span:   good(>84min)  Follows commands: intact  Safety/Judgement: good safety awareness    Activities of daily living status:   Based on direct observation, simulation and clinical assessment.      Eating:           - not seen at meal time, but demonstrates WFLs UE ROM and strength to perform self feeding  Grooming:     - supervision, standing level  UB bathing:   - supervision, simulated  LB bathing:   - minimum assistance, simulated  UB dressing: - supervision, bedside chair level  LB dressing: - minimum assistance, bedside chair level  Toileting:       - stand-by assistance    Comment(s):   Treatment:  - educated/trained pt re: use of sock aid and long handled reacher for donning/doffing socks seated in EOB.  Good teach back with supervision level for all LB dressing.    Functional mobility status:     Mobility:  Rolling -  supervision  Sit to supine -  supervision  Sit to stand -  supervision, rolling walker  Stand to sit -  supervision, rolling walker     Transfers:  Toilet transfers: supervision, rolling walker    Comment(s):   Treatment:  -  Patient  instructed on positioning and safety during bed mobility and transfers.   -  Patient instructed in proper hand placement for sit <-> stand.  -  Patient ambulated 25 feet with supervision, rolling walker  in prep for household distances and IADLs.  Patient required cueing for technique    Functional balance status:     Static Sitting Balance -            good:       maintains balance against moderate resistance  Dynamic Sitting Balance -      good (-):  independent with basic dynamic balance activities   Static Standing Balance -       fair (+):    maintains balance with independence without cueing  Dynamic Standing Balance -  fair:          able to perform full UE ROM ranges with CGA/SBA    Activity Tolerance: fair+    Upper extremity status:     Dominance:right  RIGHT: ACTIVE range of motion is [facility].  Strength is grossly graded as 4-/5: (Completes full range of motion against gravity with minimal to moderate resistance).  Comment: intact sensation, intact coordination  LEFT:   ACTIVE range of motion is WFL.  Strength is grossly graded as 4-/5: (Completes full range of motion against gravity with minimal to moderate resistance).  Comment: intact sensation, intact coordination    Final location:     Positioned in bed, all needs within reach, agrees to call for assistance.    Thank you for this referral.    Clotilda CHRISTELLA Hopping, OTR/L  Pager: [phone]

## 2019-03-22 NOTE — Progress Notes (Signed)
Wheeled Walker delivered to [employer].

## 2019-03-22 NOTE — Progress Notes (Signed)
PT goals:     1. Pt will be independent with bed mobility  in preparation for OOB activities  2. Pt will be independent to transfer with in preparation for OOB activities and ambulation.   3. Pt will be independent to ambulate a distance of 200 feet using least restrictive device to promote functional independence  4. Pt will be independent to ascend/descend 4 steps plus rail  for safe discharge to home environment  5. Pt will be independent with LE exercises x 10-15 reps each to increase strength and endurance  6. Increase strength RLE by 1/2 to1 grade higher to promote functional independence, improve ADLs/mobility and reduce risk for falls  7. Improve dynamic standing balance to good with AD to promote functional independence, improve ADLs/mobility and reduce risk for falls  8. Tolerate OOB to chair for all meals  9. Patient will demonstrate good activity tolerance during functional activities.   10. Patient will state/observe falls and R TKR precautions  11. Increase ROM R knee 0-90 degrees by POD3        PHYSICAL THERAPY EVALUATION       Patient: Cassandra Daniels (58 y.o. 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 TKR precautions  Weight bearing precautions: WBAT and RLE    Schmid Fall Risk: Total Score: 3 (03/22/19 0758)      Orders reviewed, chart reviewed, and initial evaluation completed on Cassandra Daniels.  Discussed with nursing.    ASSESSMENT :  Based on the objective data described below, the patient presents with   Decreased Strength  Decreased ADL/Functional Activities  Decreased Transfer Abilities  Decreased Ambulation Ability/Technique  Decreased  Balance  Increased Pain  Decreased Activity Tolerance  Decreased Flexibility/Joint Mobility  Decreased Independence with Home Exercise Program.  - SBA x 1 with supine to sit  - SBA x 1 with sit to stand/stand to sit with RW  - SBA x 1 to transfer to bedside chair  - fair tolerance with B LE exercises  - pt reports he ambulated earlier today with her husband from her room to nurse station ~150 feet (RN confirmed), pt declining to ambulate further during PT session d/t R knee pain  - ROM R knee 0-65 degrees      Patient will benefit from skilled  Physical Therapy intervention to address the above impairments.    Patient's rehabilitation potential is considered to be Good       PLAN :  Planned Interventions  Functional mobility training Gait Training Stair training Balance Training Therapeutic exercises Therapeutic activities AD training Patient/caregiver education    Frequency/Duration:  Patient will benefit from skilled Physical Therapy intervention to address the above impairments to return to prior level of function. Patient will be seen BID for 2 weeks.     Recommendations:  Recommend continued physical therapy during acute stay. Occupational Therapy. Recommend out of bed activity to counteract ill effects of bedrest, with assistance from staff as needed and patient is able to actively participate.      Home with family/caregiver support and Pt/family plan on going home    Discharge  Recommendations: Home with home health PT  Further Equipment Recommendations for Discharge: Rolling walker          SUBJECTIVE:   Patient: "They should have given me another nerve block. 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) ICD-10-CM: T84.84XA, Z96.651  ICD-9-CM: 996.77, V43.65  03/21/2019        Rectocele ICD-10-CM: N81.6  ICD-9-CM: 618.04  07/09/2016        Chronic pain of right  knee ICD-10-CM: M25.561, G89.29  ICD-9-CM: 719.46, 338.29  07/26/2015        Primary osteoarthritis of right knee ICD-10-CM: M17.11  ICD-9-CM: 715.16  07/26/2015        Primary osteoarthritis of first carpometacarpal joint of right hand ICD-10-CM: M18.11  ICD-9-CM: 715.14  05/17/2015        Cubital tunnel syndrome ICD-10-CM: G56.20  ICD-9-CM: 354.2  05/17/2015        Carpal tunnel syndrome of right wrist ICD-10-CM: G56.01  ICD-9-CM: 354.0  05/17/2015        Right shoulder pain ICD-10-CM: M25.511  ICD-9-CM: 719.41  Unknown        Right hip pain ICD-10-CM: M25.551  ICD-9-CM: 719.45  Unknown        Left groin pain ICD-10-CM: R10.32  ICD-9-CM: 789.04  Unknown        Cerebral meningioma (HCC) ICD-10-CM: D32.0  ICD-9-CM: 225.2  01/13/2012        History of Roux-en-Y gastric bypass ICD-10-CM: Z98.84  ICD-9-CM: V45.86  07/25/2011        Screening breast examination and mammography ICD-10-CM: Z12.39  ICD-9-CM: V76.10  Unknown        Palpitations ICD-10-CM: R00.2  ICD-9-CM: 785.1  05/09/2010        Other dyspnea and respiratory abnormality ICD-10-CM: R06.09, R09.89  ICD-9-CM: 786.09  05/09/2010        PAT (paroxysmal atrial tachycardia) (HCC) ICD-10-CM: I47.1  ICD-9-CM: 427.0  11/08/2009        Meningioma nos ICD-10-CM: M9530/0  ICD-9-CM: M9530/0  Unknown        UTI (lower urinary tract infection) ICD-10-CM: N39.0  ICD-9-CM: 599.0  12/19/2008        Sore throat ICD-10-CM: J02.9  ICD-9-CM: 462  12/19/2008             Past Medical history:   Past Medical History:   Diagnosis Date   . Adverse effect of anesthesia     slow to come out of anesthesia.   . Arthritis    . Atherosclerotic cardiovascular disease    . Bilateral knee pain    . Broken nose 1999    surgery   . Carpal tunnel syndrome    . Chronic fatigue    . Fibrocystic breast     bilateral    . Fibromyalgia    . Gastric bypass status for obesity 2009   . Left groin pain    . Left hip pain    . Meningioma nos    . MVP (mitral valve prolapse)    . Pain management    . Pancreatitis  2014   . Paroxysmal atrial tachycardia (HCC)    . Paroxysmal tachycardia (HCC)    . PVC (premature ventricular contraction)    . Right hand pain    . Right hip  pain    . Right shoulder pain    . Unspecified adverse effect of anesthesia     difficulty waking up    . Unspecified sleep apnea     does not use cpap   . Vitamin B 12 deficiency        PRIOR LEVEL OF FUNCTION / LIVING SITUATION     Information was obtained by:   patient  Home environment:   Lives with Spouse, House, 1 story, 4 steps plusa rail on entrance  Prior level of function:   independent without limitations  Home equipment: None      Patient found: Bed, Family present and ice pack on R knee.    Pain Assessment before PT session: 5/10  Pain Location:  R knee  Pain Assessment after PT session: 5/10  Pain Location:  R knee  []           Yes, patient had pain medications  []           No, Patient has not had pain medications  []           Nurse notified    COGNITIVE STATUS:     Patient is alert, oriented x3 with no cognitive deficits      EXTREMITIES ASSESSMENT:      Tone:   Normal      Sensation:  Intact  To light touch in bilateral LE's       Strength:    R quads strength ~ 3/5, otherwise R LE 4/5, L LE grossly 5/5    0/5       No palpable muscle contraction  1/5       Palpable muscle contraction, no joint movement  2-/5      Less than full range of motion in gravity eliminated position  2/5       Able to complete full range of motion in gravity eliminated position  2+/5     Able to initiate movement against gravity  3-/5      More than half but not full range of motion against gravity  3/5       Able to complete full range of motion against gravity  3+/5     Completes full range of motion against gravity with minimal resistance  4-/5      Completes full range of motion against gravity with minimal-moderate resistance  4/5       Completes full range of motion against gravity with moderate resistance  4+/5     Completes full range of motion against gravity  with moderate-maximum resistance  5/5       Completes full range of motion against gravity with maximum resistance    Range Of Motion:  R knee flexion 0-65 degrees    Functional mobility and balance status:     Mobility:  Supine to sit -  standby assist  Sit to Supine -  not tested  Sit to Stand -  standby assist and RW  Stand to Sit -  standby assist    Transfers:  Bed to bedside chair -  standby assist, verbal cues and 1 person    Balance:  Static Sitting Balance -  good  Dynamic Sitting Balance -  good-  Static Standing Balance -  fair+  Dynamic Standing Balance -  fair    Sitting Static/Dynamic   Normal static: patient able to maintain steady balance without handhold support   Normal dynamic: patient accepts maximal challenge and can shift  weight easily within full range in all directions  Good static: patient able to maintain balance without handhold support, limited postural sway  Good dynamic: patient accepts moderate challenge  Fair static: patient able to maintain balance with handhold support; requires occasional minimal assistance  Fair dynamic: patient accepts minimal challenge; able to maintain balance while turning head/trunk  Poor static: patient requires handhold support and moderate to maximal assistance to maintain position  Poor dynamic: patient unable to accept challenge or move without loss of balance         Activity Tolerance: fair+.    Ambulation/Gait Training:  steady, step to and antalgic Rolling walker standby assist  A few steps from bed to chair   - pt declined to ambulate further d/t increasing R knee pain from ambulating from her room to nurse station prior to therapy session.    Stair Training:  not tested      TREATMENT:   Patient received/participated in 15 minutes of treatment (therapeutic exercises/activities) immediately following evaluation and/or educational instruction during/immediately following PT evaluation.      Supine         EXERCISE    Sets    Reps    Active  Active  Assist    Passive  Self ROM    Comments    Heel/Toe raises  2 10 X    Verbal cues   Quad Sets/Glut Sets  2 10 X    Verbal cues   Hamstring Sets           Marching          Heel Slides  2 10 X X   Verbal cues   Straight Leg Raises  2 10 X X   Verbal cues   Hip Abd/Add  2 10 X    Verbal cues   Long Arc Quads           Mini squats                Activity Tolerance:   fair and pain limiting    Final Location:   bedside chair, all needs close, agrees to call for assistance, nurse notified  and ice pack on R knee    COMMUNICATION/EDUCATION:     Barriers to Learning/Limitations:  None  Education provided patient on  Patient, Benefit of activity while hospitalized, Call for assistance, Out of bed 2-3 times/day, Staff assistance with mobility, Changes positions frequently, Sit out of bed for 45-60 minutes or as tolerated, HEP, Safety, Demonstrates adequately, Verbalized understanding, Teaching method, Verbal, Written and Role of PT    COMMUNICATION/EDUCATION:   [x]          Fall prevention education was provided and the patient/caregiver indicated understanding.  [x]          Patient/family have participated as able in goal setting and plan of care.  [x]          Patient/family agree to work toward stated goals and plan of care.  []          Patient understands intent and goals of therapy, but is neutral about his/her participation.  []          Patient is unable to participate in goal setting and plan of care  Thank you for this referral.  Joanalyne A. Lynford Humphrey, PT  Pager # : [phone]

## 2019-03-22 NOTE — Progress Notes (Signed)
Problem: Falls - Risk of  Goal: *Absence of Falls  Description: Document Cassandra Daniels Fall Risk and appropriate interventions in the flowsheet.  Outcome: Progressing Towards Goal  Note: Fall Risk Interventions:  Mobility Interventions: Assess mobility with egress test, Bed/chair exit alarm, OT consult for ADLs, Patient to call before getting OOB, PT Consult for mobility concerns         Medication Interventions: Assess postural VS orthostatic hypotension, Bed/chair exit alarm, Patient to call before getting OOB    Elimination Interventions: Bed/chair exit alarm, Call light in reach, Patient to call for help with toileting needs

## 2019-03-22 NOTE — Progress Notes (Signed)
D/C plan home with Home Health - Freedom of choice Given CM to follow up with patient on choice for Home Health     DME - Walker Delivered to bedside    Transport- Husband     D/C Plan:    Anticipated date of D/C -     Case Management Assessment      Pharmacy: [facility]   When patient was asked: Do you have any difficulty paying for or obtaining medications? The patient replied:No        Consults:    Therapy Recommendations:    OT: Discharge Recommendations: Home health OT  Further Equipment Recommendations for Discharge: rolling walker    PT: pending -     When patient was asked: Does the patient have appropriate clothing available to be worn at discharge? The patient replied  Yes     Care Management Interventions  Mode of Transport at Discharge: Other (see comment)  Transition of Care Consult (CM Consult): Home Health  Physical Therapy Consult: Yes  Occupational Therapy Consult: Yes  Current Support Network: Lives with Spouse  Reason for Referral: DCP Rounds  History Provided By: Patient  Patient Orientation: Alert and Oriented, Person, Place, Situation, Self  Cognition: Alert  Previous Living Arrangement: Lives with Family Independent  Home Accessibility: Steps  Prior Functional Level: Independent in ADLs/IADLs  Current Functional Level: Assistance with the following:, Housework, Bathing, Shopping, Toileting, Mobility, Dressing, Cooking  Can patient return to prior living arrangement: Yes  Ability to make needs known:: Good  Family able to assist with home care needs:: Yes  Would you like for me to discuss the discharge plan with any other family members/significant others, and if so, who?: Yes(Husband as needed )  Problems related to education and literacy?: No  Problems related to employment and unemployment?: No  Occupational exposure to risk factors?: No  Problems related to housing and economic circumstances?: No  Problems related to social environment?: No  Problems related to upbringing?: No  Problems  related to primary support group, including family circumstances?: No  Problems related to psychosocial circumstances?: No  Types of Needs Identified: Disease Management Education, Treatment Education, ADLs/IADLs, Activity/Exercise, DME  Anticipated Discharge Needs: DME, Home Health Services  Confirm Follow Up Transport: Family  Confirm Transport and Arrange: Yes  The Patient and/or Patient Representative was Provided with a Choice of Provider and Agrees with the Discharge Plan?: Yes  Name of the Patient Representative Who was Provided with a Choice of Provider and Agrees with the Discharge Plan: patient   Freedom of Choice List was Provided with Basic Dialogue that Supports the Patient's Individualized Plan of Care/Goals, Treatment Preferences and Shares the Quality Data Associated with the Providers?: Yes  Discharge Location  Discharge Placement: Home with home health

## 2019-03-22 NOTE — Other (Signed)
Pt has complained throughout the night regarding pain management. Pt is receiving prn pain medication q2h, in addition to the scheduled meds that she would accept. Pt has requested a new nerve block at the bedside because "mine never worked and I told them that as soon as I woke up". It was explained to her that that is not something we normally do. Pt is sleeping, appearing comfortable, anytime writer goes into the room. Pt wakes up when spoken to. Pt has been setting alarms for when prn meds are available to her and upon receiving already asking about next dose. Will continue to monitor.

## 2019-03-22 NOTE — Progress Notes (Addendum)
PT goals:     1. Pt will be independent with bed mobility  in preparation for OOB activities  2. Pt will be independent to transfer with in preparation for OOB activities and ambulation.   3. Pt will be independent to ambulate a distance of 200 feet using least restrictive device to promote functional independence  4. Pt will be independent to ascend/descend 4 steps plus rail  for safe discharge to home environment  5. Pt will be independent with LE exercises x 10-15 reps each to increase strength and endurance  6. Increase strength RLE by 1/2 to1 grade higher to promote functional independence, improve ADLs/mobility and reduce risk for falls  7. Improve dynamic standing balance to good with AD to promote functional independence, improve ADLs/mobility and reduce risk for falls  8. Tolerate OOB to chair for all meals  9. Patient will demonstrate good activity tolerance during functional activities.   10. Patient will state/observe falls and R TKR precautions  11. Increase ROM R knee 0-90 degrees by POD3        PHYSICAL THERAPY EVALUATION       Patient: Cassandra Daniels (58 y.o. 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 TKR precautions  Weight bearing precautions: WBAT and RLE    Schmid Fall Risk: Total Score: 3 (03/22/19 0758)      Orders reviewed, chart reviewed, and initial evaluation completed on Cassandra Daniels.  Discussed with nursing.    ASSESSMENT :  Based on the objective data described below, the patient presents with   Decreased Strength  Decreased ADL/Functional Activities  Decreased Transfer Abilities  Decreased Ambulation Ability/Technique   Decreased Balance  Increased Pain  Decreased Activity Tolerance  Decreased Flexibility/Joint Mobility  Decreased Independence with Home Exercise Program.  - SBA x 1 with supine to sit  - SBA x 1 with sit to stand/stand to sit with RW  - SBA x 1 to transfer to bedside chair  - fair tolerance with B LE exercises  - pt reports he ambulated earlier today with her husband from her room to nurse station ~150 feet (RN confirmed), pt declining to ambulate further during PT session d/t R knee pain  - ROM R knee 0-65 degrees      Patient will benefit from skilled  Physical Therapy intervention to address the above impairments.    Patient???s rehabilitation potential is considered to be Good       PLAN :  Planned Interventions  Functional mobility training Gait Training Stair training Balance Training Therapeutic exercises Therapeutic activities AD training Patient/caregiver education    Frequency/Duration:  Patient will benefit from skilled Physical Therapy intervention to address the above impairments to return to prior level of function. Patient will be seen BID for 2 weeks.     Recommendations:  Recommend continued physical therapy during acute stay. Occupational Therapy. Recommend out of bed activity to counteract ill effects of bedrest, with assistance from staff as needed and patient is able to actively participate.      Home with family/caregiver support and Pt/family plan on going home    Discharge  Recommendations: Home with home health PT  Further Equipment Recommendations for Discharge: Rolling walker          SUBJECTIVE:   Patient: "They should have given me another nerve block. It was not working."    OBJECTIVE DATA SUMMARY:   Present illness history:   Problem List  Date Reviewed: 09/11/17          Codes Class Noted    Pain due to total right knee replacement ([place]) ICD-10-CM: T84.84XA, Z96.651  ICD-9-CM: 996.77, V43.65  03/21/2019        Rectocele ICD-10-CM: N81.6  ICD-9-CM: 618.04  07/09/2016         Chronic pain of right knee ICD-10-CM: M25.561, G89.29  ICD-9-CM: 719.46, 338.29  07/26/2015        Primary osteoarthritis of right knee ICD-10-CM: M17.11  ICD-9-CM: 715.16  07/26/2015        Primary osteoarthritis of first carpometacarpal joint of right hand ICD-10-CM: M18.11  ICD-9-CM: 715.14  05/17/2015        Cubital tunnel syndrome ICD-10-CM: G56.20  ICD-9-CM: 354.2  05/17/2015        Carpal tunnel syndrome of right wrist ICD-10-CM: G56.01  ICD-9-CM: 354.0  05/17/2015        Right shoulder pain ICD-10-CM: M25.511  ICD-9-CM: 719.41  Unknown        Right hip pain ICD-10-CM: M25.551  ICD-9-CM: 719.45  Unknown        Left groin pain ICD-10-CM: R10.32  ICD-9-CM: 789.04  Unknown        Cerebral meningioma ([place]) ICD-10-CM: D32.0  ICD-9-CM: 225.2  01/13/2012        History of Roux-en-Y gastric bypass ICD-10-CM: Z98.84  ICD-9-CM: V45.86  07/25/2011        Screening breast examination and mammography ICD-10-CM: Z12.39  ICD-9-CM: V76.10  Unknown        Palpitations ICD-10-CM: R00.2  ICD-9-CM: 785.1  05/09/2010        Other dyspnea and respiratory abnormality ICD-10-CM: R06.09, R09.89  ICD-9-CM: 786.09  05/09/2010        PAT (paroxysmal atrial tachycardia) (HCC) ICD-10-CM: I47.1  ICD-9-CM: 427.0  11/08/2009        Meningioma nos ICD-10-CM: M9530/0  ICD-9-CM: F6433/2  Unknown        UTI (lower urinary tract infection) ICD-10-CM: N39.0  ICD-9-CM: 599.0  12/19/2008        Sore throat ICD-10-CM: J02.9  ICD-9-CM: 951  12/19/2008             Past Medical history:   Past Medical History:   Diagnosis Date   ??? Adverse effect of anesthesia     slow to come out of anesthesia.   ??? Arthritis    ??? Atherosclerotic cardiovascular disease    ??? Bilateral knee pain    ??? Broken nose 1999    surgery   ??? Carpal tunnel syndrome    ??? Chronic fatigue    ??? Fibrocystic breast     bilateral    ??? Fibromyalgia    ??? Gastric bypass status for obesity 2009   ??? Left groin pain    ??? Left hip pain    ??? Meningioma nos    ??? MVP (mitral valve prolapse)     ??? Pain management    ??? Pancreatitis 2014   ??? Paroxysmal atrial tachycardia ([place])    ??? Paroxysmal tachycardia ([place])    ??? PVC (premature ventricular contraction)    ??? Right hand pain    ??? Right hip  pain    ??? Right shoulder pain    ??? Unspecified adverse effect of anesthesia     difficulty waking up    ??? Unspecified sleep apnea     does not use cpap   ??? Vitamin B 12 deficiency        PRIOR LEVEL OF FUNCTION / LIVING SITUATION     Information was obtained by:   patient  Home environment:   Lives with Spouse, House, 1 story, 4 steps plusa rail on entrance  Prior level of function:   independent without limitations  Home equipment: None      Patient found: Bed, Family present and ice pack on R knee.    Pain Assessment before PT session: 5/10  Pain Location:  R knee  Pain Assessment after PT session: 5/10  Pain Location:  R knee  []           Yes, patient had pain medications  []           No, Patient has not had pain medications  []           Nurse notified    COGNITIVE STATUS:     Patient is alert, oriented x3 with no cognitive deficits      EXTREMITIES ASSESSMENT:      Tone:   Normal      Sensation:  Intact  To light touch in bilateral LE's       Strength:    R quads strength ~ 3/5, otherwise R LE 4/5, L LE grossly 5/5    0/5       No palpable muscle contraction  1/5       Palpable muscle contraction, no joint movement  2-/5      Less than full range of motion in gravity eliminated position  2/5       Able to complete full range of motion in gravity eliminated position  2+/5     Able to initiate movement against gravity  3-/5      More than half but not full range of motion against gravity  3/5       Able to complete full range of motion against gravity  3+/5     Completes full range of motion against gravity with minimal resistance  4-/5      Completes full range of motion against gravity with minimal-moderate resistance  4/5       Completes full range of motion against gravity with moderate resistance   4+/5     Completes full range of motion against gravity with moderate-maximum resistance  5/5       Completes full range of motion against gravity with maximum resistance    Range Of Motion:  R knee flexion 0-65 degrees    Functional mobility and balance status:     Mobility:  Supine to sit -  standby assist  Sit to Supine -  not tested  Sit to Stand -  standby assist and RW  Stand to Sit -  standby assist    Transfers:  Bed to bedside chair -  standby assist, verbal cues and 1 person    Balance:  Static Sitting Balance -  good  Dynamic Sitting Balance -  good-  Static Standing Balance -  fair+  Dynamic Standing Balance -  fair    Sitting Static/Dynamic   Normal static: patient able to maintain steady balance without handhold support   Normal dynamic: patient accepts maximal challenge and can shift  weight easily within full range in all directions  Good static: patient able to maintain balance without handhold support, limited postural sway  Good dynamic: patient accepts moderate challenge  Fair static: patient able to maintain balance with handhold support; requires occasional minimal assistance  Fair dynamic: patient accepts minimal challenge; able to maintain balance while turning head/trunk  Poor static: patient requires handhold support and moderate to maximal assistance to maintain position  Poor dynamic: patient unable to accept challenge or move without loss of balance         Activity Tolerance: fair+.    Ambulation/Gait Training:  steady, step to and antalgic Rolling walker standby assist  A few steps from bed to chair   - pt declined to ambulate further d/t increasing R knee pain from ambulating from her room to nurse station prior to therapy session.    Stair Training:  not tested      TREATMENT:   Patient received/participated in 15 minutes of treatment (therapeutic exercises/activities) immediately following evaluation and/or educational instruction during/immediately following PT evaluation.       Supine         EXERCISE    Sets    Reps    Active  Active Assist    Passive  Self ROM    Comments    Heel/Toe raises  2 10 X    Verbal cues   Quad Sets/Glut Sets  2 10 X    Verbal cues   Hamstring Sets           Marching          Heel Slides  2 10 X X   Verbal cues   Straight Leg Raises  2 10 X X   Verbal cues   Hip Abd/Add  2 10 X    Verbal cues   Long Arc Quads           Mini squats                Activity Tolerance:   fair and pain limiting    Final Location:   bedside chair, all needs close, agrees to call for assistance, nurse notified  and ice pack on R knee    COMMUNICATION/EDUCATION:     Barriers to Learning/Limitations:  None  Education provided patient on  Patient, Benefit of activity while hospitalized, Call for assistance, Out of bed 2-3 times/day, Staff assistance with mobility, Changes positions frequently, Sit out of bed for 45-60 minutes or as tolerated, HEP, Safety, Demonstrates adequately, Verbalized understanding, Teaching method, Verbal, Written and Role of PT    COMMUNICATION/EDUCATION:   [x]          Fall prevention education was provided and the patient/caregiver indicated understanding.  [x]          Patient/family have participated as able in goal setting and plan of care.  [x]          Patient/family agree to work toward stated goals and plan of care.  []          Patient understands intent and goals of therapy, but is neutral about his/her participation.  []          Patient is unable to participate in goal setting and plan of care  Thank you for this referral.  Joanalyne A. Jamesetta Geralds, PT  Pager # : [phone]

## 2019-03-22 NOTE — Progress Notes (Signed)
D/C plan home with Home Health - Freedom of choice Given CM to follow up with patient on choice for [facility]     DME - Walker Delivered to bedside    Transport- Husband     D/C Plan:    Anticipated date of D/C -     Case Management Assessment      Pharmacy: [facility]   When patient was asked: Do you have any difficulty paying for or obtaining medications? The patient replied:No        Consults:    Therapy Recommendations:    OT: Discharge Recommendations: Home health OT  Further Equipment Recommendations for Discharge: rolling walker    PT: pending -     When patient was asked: Does the patient have appropriate clothing available to be worn at discharge? The patient replied  Yes     Care Management Interventions  Mode of Transport at Discharge: Other (see comment)  Transition of Care Consult (CM Consult): Home Health  Physical Therapy Consult: Yes  Occupational Therapy Consult: Yes  Current Support Network: Lives with Spouse  Reason for Referral: DCP Rounds  History Provided By: Patient  Patient Orientation: Alert and Oriented, Person, Place, Situation, Self  Cognition: Alert  Previous Living Arrangement: Lives with Family Independent  Home Accessibility: Steps  Prior Functional Level: Independent in ADLs/IADLs  Current Functional Level: Assistance with the following:, Housework, Bathing, Shopping, Toileting, Mobility, Dressing, Cooking  Can patient return to prior living arrangement: Yes  Ability to make needs known:: Good  Family able to assist with home care needs:: Yes  Would you like for me to discuss the discharge plan with any other family members/significant others, and if so, who?: Yes(Husband as needed )  Problems related to education and literacy?: No  Problems related to employment and unemployment?: No  Occupational exposure to risk factors?: No  Problems related to housing and economic circumstances?: No  Problems related to social environment?: No  Problems related to upbringing?: No   Problems related to primary support group, including family circumstances?: No  Problems related to psychosocial circumstances?: No  Types of Needs Identified: Disease Management Education, Treatment Education, ADLs/IADLs, Activity/Exercise, DME  Anticipated Discharge Needs: DME, Home Health Services  Confirm Follow Up Transport: Family  Confirm Transport and Arrange: Yes  The Patient and/or Patient Representative was Provided with a Choice of Provider and Agrees with the Discharge Plan?: Yes  Name of the Patient Representative Who was Provided with a Choice of Provider and Agrees with the Discharge Plan: patient   Freedom of Choice List was Provided with Basic Dialogue that Supports the Patient's Individualized Plan of Care/Goals, Treatment Preferences and Shares the Quality Data Associated with the Providers?: Yes  Discharge Location  Discharge Placement: Home with home health

## 2019-03-22 NOTE — Progress Notes (Addendum)
OCCUPATIONAL THERAPY EVALUATION     Patient: Cassandra Daniels (58 y.o. 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) [T84.84XA, Z96.651]   Procedure(s) (LRB):  REVISION RIGHT TOTAL KNEE ARTHROPLASTY (Right) 1 Day Post-Op  Date of Admission: 03/21/2019   Length of Stay:  1 day(s)  Insurance: Payor: [employer] / Plan: [place] HMO / Product Type: Commerical /      Date: 03/22/2019  In time:  6226        Out time:  63    Precautions:Falls, KNEE PRECAUTIONS: do not put anything under the knee and do not twist the knee   Ordered Weight Bearing Status: WBAT, right lower extremity    Isolation:  There are currently no Active Isolations       MDRO: No active infections        ASSESSMENT :   Based on the objective data described below, the patient presents with   - generalized muscle weakness affecting function in ADLs  - bilateral upper extremity and bilateral lower extremity weakness  - decreased functional standing balance    - decreased functional mobility   - decreased tolerance to sustained activity  - increased pain   affecting patient's safety and independence/ability to perform basic ADLs/IADLs    Patient will benefit from skilled occupational therapy intervention to address the above impairments.    Patient???s rehabilitation potential is considered to be Good for below stated goals.     Recommendations:  Recommend continued occupational therapy during acute stay. Recommend out of bed activity to counteract ill effects of bedrest, with assistance from staff as needed.  Discharge Recommendations: Home health OT  Further Equipment Recommendations for Discharge: rolling walker     Plan:   Patient will be followed by occupational therapy to address goals while hospitalized as patient's status and schedule permit.   Patient to be seen 1-5x/week x 2 weeks.     Planned interventions may include any combination of the following: Adaptive equipment, ADI training, activity tolerance, functional balance training, functional mobility training, therapeutic exercise, therapeutic activity, patient/caregiver education and training, home exercise program, neuromuscular re-education and energy conservation    Patient and/or family have participated as able in goal setting and plan of care.  Occupational Therapy goals:     OT goals initiated 03/22/2019 and will be met by patient within 1-2 weeks in order to promote maximal independence in ADLs/IADLs.    Pt will be modified independent with LB dressing sitting EOB/bedside chair w/ AE as needed.   Pt will be modified independent with toilet transfer.   Pt will be Modified independent with move from supine to sit and sit to supine  in bed.   Pt will increase static and dynamic standing balance to good during functional ADL tasks.   Pt will tolerate standing for 5 minutes to promote maximal independence in ADLs.   Pt will increase bilateral  upper extremity strength to 5/5.    Education/ communication:     Barriers to Learning/Limitations:  None  Education provided to: patient on (+) role of OT, (+) OT plan of care, (+) instructed patient on the importance of activity while hospitalized to prevent a decline in function, (+) encouraged patient to sit up in chair for 45 (+) minutes or as tolerated 2-3 times a day, with staff assistance as needed, (+)  ADL training  Educational Handouts issued: none this session  Patient / Family readiness to learn indicated by: verbalized understanding and demonstrated understanding    SUBJECTIVE:     Patient "I'm good, this isn't my first surgery"    Pain Assessment: 4 / 10, location: R knee     OBJECTIVE DATA SUMMARY:     Orders, labs, and chart reviewed on [name]. Communicated with RN    Patient was admitted to the hospital on 03/21/2019 with No chief complaint on file.     Present illness history:   Patient Active Problem List    Diagnosis Date Noted   ??? 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 Rectocele 07/09/2016   ??? Chronic pain of right knee 07/26/2015   ??? Primary osteoarthritis of right knee 07/26/2015   ??? Primary osteoarthritis of first carpometacarpal joint of right hand 05/17/2015   ??? Cubital tunnel syndrome 05/17/2015   ??? Carpal tunnel syndrome of right wrist 05/17/2015   ??? Right shoulder pain    ??? Right hip pain    ??? Left groin pain    ??? Cerebral meningioma ([place]) 01/13/2012   ??? 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 Screening breast examination and mammography    ??? Palpitations 05/09/2010   ??? Other dyspnea and respiratory abnormality 05/09/2010   ??? PAT (paroxysmal atrial tachycardia) ([place]) 11/08/2009   ??? Meningioma nos    ??? UTI (lower urinary tract infection) 12/19/2008   ??? Sore throat 12/19/2008      Previous medical history:   Past Medical History:   Diagnosis Date   ??? Adverse effect of anesthesia     slow to come out of anesthesia.   ??? Arthritis    ??? Atherosclerotic cardiovascular disease    ??? Bilateral knee pain    ??? Broken nose 1999    surgery   ??? Carpal tunnel syndrome    ??? Chronic fatigue    ??? Fibrocystic breast     bilateral    ??? Fibromyalgia    ??? Gastric bypass status for obesity 2009   ??? Left groin pain    ??? Left hip pain    ??? Meningioma nos    ??? MVP (mitral valve prolapse)    ??? Pain management    ??? Pancreatitis 2014   ??? Paroxysmal atrial tachycardia ([place])    ??? Paroxysmal tachycardia ([place])    ??? PVC (premature ventricular contraction)    ??? Right hand pain    ??? Right hip pain    ??? Right shoulder pain    ??? Unspecified adverse effect of anesthesia     difficulty waking up    ??? Unspecified sleep apnea     does not use cpap   ??? Vitamin B 12 deficiency        Patient found:     Bedside chair, ice to RLE.    Patient received / participated in 8 minutes of treatment (ADI retraining)  and/or educational instruction during/immediately following OT evaluation    Prior level of function / living situation status:     Information was obtained by:   patient  Home environment:   Patient lives with spouse in a 1 story house 3STE.     Prior level of function:Patient is independent with no limitations.  Prior level of Instrumental Activities of Daily Living:   Patient is independent with all IADLs.  Home equipment: none reported  Cognitive status:     Mental status:   Orientation: Patient is oriented to person,place, month and year  Communication: normal  Attention Span:   good(>60mn)  Follows commands: intact  Safety/Judgement: good safety awareness    Activities of daily living status:   Based on direct observation, simulation and clinical assessment.      Eating:           - not seen at meal time, but demonstrates WFLs UE ROM and strength to perform self feeding  Grooming:     - supervision, standing level  UB bathing:   - supervision, simulated  LB bathing:   - minimum assistance, simulated  UB dressing: - supervision, bedside chair level  LB dressing: - minimum assistance, bedside chair level  Toileting:       - stand-by assistance    Comment(s):   Treatment:  - educated/trained pt re: use of sock aid and long handled reacher for donning/doffing socks seated in EOB.  Good teach back with supervision level for all LB dressing.    Functional mobility status:     Mobility:  Rolling -  supervision  Sit to supine -  supervision  Sit to stand -  supervision, rolling walker  Stand to sit -  supervision, rolling walker     Transfers:  Toilet transfers: supervision, rolling walker    Comment(s):   Treatment:  -  Patient  instructed on positioning and safety during bed mobility and transfers.   -  Patient instructed in proper hand placement for sit <-> stand.  -  Patient ambulated 25 feet with supervision, rolling walker  in prep for household distances and IADLs.  Patient required cueing for technique     Functional balance status:     Static Sitting Balance -           good:       maintains balance against moderate resistance  Dynamic Sitting Balance -      good (-):  independent with basic dynamic balance activities   Static Standing Balance -       fair (+):    maintains balance with independence without cueing  Dynamic Standing Balance -  fair:          able to perform full UE ROM ranges with CGA/SBA    Activity Tolerance: fair+    Upper extremity status:     Dominance:right  RIGHT: ACTIVE [facility]  Strength is grossly graded as 4-/5: (Completes full range of motion against gravity with minimal to moderate resistance).  Comment: intact sensation, intact coordination  LEFT:   ACTIVE range of motion is WFL.  Strength is grossly graded as 4-/5: (Completes full range of motion against gravity with minimal to moderate resistance).  Comment: intact sensation, intact coordination    Final location:     Positioned in bed, all needs within reach, agrees to call for assistance.    Thank you for this referral.    SDarrol Poke OTR/L  Pager: [phone]

## 2019-03-22 NOTE — Nurse Consult (Addendum)
[facility]  NURSE PRACTITIONER NAVIGATOR NOTE      Patient: Cassandra Daniels Age: 58 y.o. Sex: female    Date of Birth: 06-25-1961 Admit Date: 03/21/2019 PCP: Georgianne Fick, MD   MRN: [id number]  CSN: [id number]  LOS: 1 day(s)     Surgeon(s):  Orene Desanctis, MD   Procedure(s):  REVISION RIGHT TOTAL KNEE ARTHROPLASTY    Estimated Discharge date:  03/23/2019  Discharge Location:  home    Subjective:   Patient Progressing well      Objective     Vitals:    03/21/19 2021 03/21/19 2348 03/22/19 0404 03/22/19 0725   BP: 138/63 113/61 129/76 117/68   Pulse: (!) 104 96 83 97   Resp: 18 16 16 18    Temp: 98.9 ??F (37.2 ??C) 98.4 ??F (36.9 ??C) 98.6 ??F (37 ??C) 98.1 ??F (36.7 ??C)   SpO2: 98% 100% 98% 98%   Weight:       Height:         Recent Labs     03/22/19  0640   WBC 8.8   HGB 12.2*   HCT 38.6   MCV 93.5   PLT 232     Physical Exam:  General: Patient alert and oriented.    Respiratory: Respirations unlabored  Neurovascular/Extremities: NVI; Positive 2+ dorsalis pedal pulses and posterior tibial bilaterally.  Dressing: clean, dry and intact.  PT:  Up walking in hall      Education:   Reinforce ICS use 10 times every hour while awake and continue at home for 10 days.  Maintain a normal bowel movement naturally (i.e. Ambulation, fluid, prune juice or tea).  Eat a low-salt, healthy diet to help with healing and decrease swelling.  Ankle pumps 10 times an hour while awake.  DVT prophylaxis: Xarelto  TED hose on for 6 weeks but may remove at night for hygiene.  Report signs and symptoms of blood clots (calf pain, swelling, shortness of breath, cough).  Incision care : Remove Aquacel dressing in 7 days   Continue exercises as instructed by physical therapy.  Towel under her ankle if knee replacement  Follow-up appointment: 2 weeks  Continue home regimen for otherwise chronic, stable medical conditions as noted above.    All questions answered to the satisfaction of the patient/family who also  verbalized understanding   of above education.      Raliegh Ip, NP  March 22, 2019   8:55 AM

## 2019-03-22 NOTE — Progress Notes (Addendum)
PT goals:     1. Pt will be independent with bed mobility  in preparation for OOB activities  2. Pt will be independent to transfer with in preparation for OOB activities and ambulation.   3. Pt will be independent to ambulate a distance of 200 feet using least restrictive device to promote functional independence  4. Pt will be independent to ascend/descend 4 steps plus rail  for safe discharge to home environment  5. Pt will be independent with LE exercises x 10-15 reps each to increase strength and endurance  6. Increase strength RLE by 1/2 to1 grade higher to promote functional independence, improve ADLs/mobility and reduce risk for falls  7. Improve dynamic standing balance to good with AD to promote functional independence, improve ADLs/mobility and reduce risk for falls  8. Tolerate OOB to chair for all meals  9. Patient will demonstrate good activity tolerance during functional activities.   10. Patient will state/observe falls and R TKR precautions  11. Increase ROM R knee 0-90 degrees by POD3        PHYSICAL THERAPY TREATMENT       Patient: Cassandra Daniels (58 y.o. 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 TKR precautions  Weight bearing precautions: WBAT and RLE    Schmid Fall Risk: Total Score: 3 (03/22/19 0758)      Orders reviewed, chart reviewed, and PT treatment completed on Terrial Rhodes.  Discussed with nursing.    ASSESSMENT :  Based on the objective data described below, the patient presents with    - Increased tolerance to activity despite c/o R knee pain  - SBA x 1 with bed mobility  - SBA x 1 with sit to stand/stand to sit with RW   - SBA x 1 to ambulate a distance of 100 feet using RW   - SBA with transfer to and from toilet, using RW  - fair minus tolerance with LE exercises d/t pain  - shows motivation to participate with PT despite c/o R knee pain.  - progressing towards goals         Patient will benefit from skilled  Physical Therapy intervention to address the above impairments.    Patient???s rehabilitation potential is considered to be Good       PLAN :  Planned Interventions  Functional mobility training Gait Training Stair training Balance Training Therapeutic exercises Therapeutic activities AD training Patient/caregiver education    Frequency/Duration:  Patient will benefit from skilled Physical Therapy intervention to address the above impairments to return to prior level of function. Patient will be seen BID for 2 weeks.     Recommendations:  Recommend continued physical therapy during acute stay. Occupational Therapy. Recommend out of bed activity to counteract ill effects of bedrest, with assistance from staff as needed and patient is able to actively participate.      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01  ICD-9-CM: 354.0  05/17/2015        Right shoulder pain ICD-10-CM: M25.511  ICD-9-CM: 719.41  Unknown        Right hip pain ICD-10-CM: M25.551  ICD-9-CM: 719.45  Unknown        Left groin pain ICD-10-CM: R10.32  ICD-9-CM: 789.04  Unknown        Cerebral meningioma ([place]) ICD-10-CM: D32.0  ICD-9-CM: 225.2  01/13/2012        History of Roux-en-Y gastric bypass ICD-10-CM: Z98.84  ICD-9-CM: V45.86  07/25/2011        Screening breast examination and mammography ICD-10-CM: Z12.39  ICD-9-CM: V76.10  Unknown        Palpitations ICD-10-CM: R00.2  ICD-9-CM: 785.1  05/09/2010        Other dyspnea and respiratory abnormality ICD-10-CM: R06.09, R09.89  ICD-9-CM: 786.09  05/09/2010        PAT (paroxysmal atrial tachycardia) ([place]) ICD-10-CM: I47.1  ICD-9-CM: 427.0  11/08/2009        Meningioma nos ICD-10-CM: M9530/0  ICD-9-CM: W2585/2  Unknown        UTI (lower urinary tract infection) ICD-10-CM: N39.0  ICD-9-CM: 599.0  12/19/2008        Sore throat ICD-10-CM: J02.9  ICD-9-CM: 778  12/19/2008             Past Medical history:   Past Medical History:   Diagnosis Date   ??? Adverse effect of anesthesia     slow to come out of anesthesia.   ??? Arthritis    ??? Atherosclerotic cardiovascular disease    ??? Bilateral knee pain    ??? Broken nose 1999    surgery   ??? Carpal tunnel syndrome    ??? Chronic fatigue    ??? Fibrocystic breast     bilateral    ??? Fibromyalgia    ??? Gastric bypass status for obesity 2009   ??? Left groin pain    ??? Left hip pain    ??? Meningioma nos    ??? MVP (mitral valve prolapse)    ??? Pain management    ??? Pancreatitis 2014   ??? Paroxysmal atrial tachycardia ([place])    ??? Paroxysmal tachycardia ([place])    ??? PVC (premature ventricular contraction)    ??? Right hand pain    ??? Right hip pain    ??? Right shoulder pain    ??? Unspecified adverse effect of anesthesia      difficulty waking up    ???  Unspecified sleep apnea     does not use cpap   ??? Vitamin B 12 deficiency        PRIOR LEVEL OF FUNCTION / LIVING SITUATION (from PT eval note)     Information was obtained by:   patient  Home environment:   Lives with Spouse, House, 1 story, 4 steps plusa rail on entrance  Prior level of function:   independent without limitations  Home equipment: None      Patient found: Bed and ice pack on R knee.    Pain Assessment before PT session: 8/10  Pain Location:  R knee  Pain Assessment after PT session: 8/10  Pain Location:  R knee  []           Yes, patient had pain medications  []           No, Patient has not had pain medications  []           Nurse notified    COGNITIVE STATUS:     Patient is alert, oriented x3 with no cognitive deficits      EXTREMITIES ASSESSMENT:      Range Of Motion:  Pt declined ROM and measurement of R knee d/t pain, R knee flexion 0-65 degrees during first PT session.     Functional mobility and balance status:     Mobility:  Supine to sit -  standby assist  Sit to Supine -  standby assist  Sit to Stand -  standby assist and RW  Stand to Sit -  standby assist    Transfers:  Transfer to and from toilet with SBA x 1.   - SBA x 1 for hygiene activity with pt standing in front of sink      Balance:  Static Sitting Balance -  good  Dynamic Sitting Balance -  good-  Static Standing Balance -  fair+  Dynamic Standing Balance -  fair    Sitting Static/Dynamic   Normal static: patient able to maintain steady balance without handhold support   Normal dynamic: patient accepts maximal challenge and can shift weight easily within full range in all directions  Good static: patient able to maintain balance without handhold support, limited postural sway  Good dynamic: patient accepts moderate challenge  Fair static: patient able to maintain balance with handhold support; requires occasional minimal assistance  Fair dynamic: patient accepts minimal challenge; able to maintain balance  while turning head/trunk  Poor static: patient requires handhold support and moderate to maximal assistance to maintain position  Poor dynamic: patient unable to accept challenge or move without loss of balance         Activity Tolerance: fair+.    Ambulation/Gait Training:  steady, step to and antalgic Rolling walker standby assist   100 feet    Stair Training:  not tested      THERAPEUTIC EXERCISES       Supine         EXERCISE    Sets    Reps    Active  Active Assist    Passive  Self ROM    Comments    Heel/Toe raises  2 10 X    Verbal cues   Quad Sets/Glut Sets  2 10 X    Verbal cues   Hamstring Sets           Marching          Heel Slides        Pt declined d/t  pain R knee   Straight Leg Raises  2 10 X X   Verbal cues   Hip Abd/Add  2 10 X    Verbal cues   Long Arc Quads           Mini squats                Activity Tolerance:   fair and pain limiting    Final Location:   bed, all needs close, agrees to call for assistance, nurse notified  and ioce pack on R knee    COMMUNICATION/EDUCATION:     Barriers to Learning/Limitations:  None  Education provided patient on  Patient, Benefit of activity while hospitalized, Call for assistance, Out of bed 2-3 times/day, Staff assistance with mobility, Changes positions frequently, Sit out of bed for 45-60 minutes or as tolerated, HEP, Safety, Demonstrates adequately, Verbalized understanding, Teaching method, Verbal, Written and Role of PT    COMMUNICATION/EDUCATION:   [x]          Fall prevention education was provided and the patient/caregiver indicated understanding.  [x]          Patient/family have participated as able in goal setting and plan of care.  [x]          Patient/family agree to work toward stated goals and plan of care.  []          Patient understands intent and goals of therapy, but is neutral about his/her participation.  []          Patient is unable to participate in goal setting and plan of care  Thank you for this referral.  Joanalyne A. Jamesetta Geralds, PT   Pager # : [phone]

## 2019-03-22 NOTE — Progress Notes (Signed)
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     1. I certify that the patient needs intermittent care as follows: physical therapy: strengthening, stretching/ROM and transfer training    2. I certify that this patient is homebound, that is: 1) patient requires the use of a walker device, special transportation, or assistance of another to leave the home; or 2) patient's condition makes leaving the home medically contraindicated; and 3) patient has a normal inability to leave the home and leaving the home requires considerable and taxing effort.  Patient may leave the home for infrequent and short duration for medical reasons, and occasional absences for non-medical reasons. Homebound status is due to the following functional limitations: Patient's ambulation limited secondary to severe pain and requires the use of an assistive device and the assistance of a caregiver for safe completion.  Patient with strength and ROM deficits limiting ambulation endurance  requiring the use of an assistive device and the assistance of a caregiver.  Patient deemed temporarily homebound secondary to increased risk for infection when leaving home and going out into the community.  Patient currently under activity restrictions secondary to recent surgical procedure, this hinders their ability to safely leave the home.    3. I certify that this patient is under my care and that I, or a nurse practitioner or physician???s assistant, or clinical nurse specialist, or certified nurse midwife, working with me, had a Face-to-Face Encounter that meets the physician Face-to-Face Encounter requirements.  The following are the clinical findings from the [facility] encounter that support the need for skilled services and is a summary of the encounter: see D/C summary      See discharge summary    Electronically signed:  Kerrin Mo, RN  03/22/2019      THE FOLLOWING TO BE COMPLETED BY THE  PHYSICIAN:    I concur with the findings described above from the F2F encounter that this patient is homebound and in need of a skilled service.    Electronically signed:  Certifying Physician:

## 2019-03-23 LAB — CBC WITH AUTOMATED DIFF
BASOPHILS: 0.4 % (ref 0–3)
EOSINOPHILS: 2.1 % (ref 0–5)
HCT: 40.7 % (ref 37.0–50.0)
HGB: 12.5 gm/dl — ABNORMAL LOW (ref 13.0–17.2)
IMMATURE GRANULOCYTES: 0.4 % (ref 0.0–3.0)
LYMPHOCYTES: 35.3 % (ref 28–48)
MCH: 28.9 pg (ref 25.4–34.6)
MCHC: 30.7 gm/dl (ref 30.0–36.0)
MCV: 94.2 fL (ref 80.0–98.0)
MONOCYTES: 10.8 % (ref 1–13)
MPV: 11.1 fL — ABNORMAL HIGH (ref 6.0–10.0)
NEUTROPHILS: 51 % (ref 34–64)
NRBC: 0 (ref 0–0)
PLATELET: 223 10*3/uL (ref 140–450)
RBC: 4.32 M/uL (ref 3.60–5.20)
RDW-SD: 53.4 — ABNORMAL HIGH (ref 36.4–46.3)
WBC: 7.6 10*3/uL (ref 4.0–11.0)

## 2019-03-23 LAB — METABOLIC PANEL, BASIC
Anion gap: 4 mmol/L — ABNORMAL LOW (ref 5–15)
BUN: 9 mg/dl (ref 7–25)
CO2: 32 mEq/L (ref 21–32)
Calcium: 8.8 mg/dl (ref 8.5–10.1)
Chloride: 106 mEq/L (ref 98–107)
Creatinine: 0.9 mg/dl (ref 0.6–1.3)
GFR est AA: >60
GFR est non-AA: >60
Glucose: 93 mg/dl (ref 74–106)
Potassium: 3.8 mEq/L (ref 3.5–5.1)
Sodium: 142 mEq/L (ref 136–145)

## 2019-03-23 LAB — CBC WITH AUTO DIFFERENTIAL
Basophils %: 0.4 % (ref 0–3)
Eosinophils %: 2.1 % (ref 0–5)
Hematocrit: 40.7 % (ref 37.0–50.0)
Hemoglobin: 12.5 gm/dl — ABNORMAL LOW (ref 13.0–17.2)
Immature Granulocytes: 0.4 % (ref 0.0–3.0)
Lymphocytes %: 35.3 % (ref 28–48)
MCH: 28.9 pg (ref 25.4–34.6)
MCHC: 30.7 gm/dl (ref 30.0–36.0)
MCV: 94.2 fL (ref 80.0–98.0)
MPV: 11.1 fL — ABNORMAL HIGH (ref 6.0–10.0)
Monocytes %: 10.8 % (ref 1–13)
Neutrophils %: 51 % (ref 34–64)
Nucleated RBCs: 0 (ref 0–0)
Platelets: 223 10*3/uL (ref 140–450)
RBC: 4.32 M/uL (ref 3.60–5.20)
RDW-SD: 53.4 — ABNORMAL HIGH (ref 36.4–46.3)
WBC: 7.6 10*3/uL (ref 4.0–11.0)

## 2019-03-23 LAB — BASIC METABOLIC PANEL
Anion Gap: 4 mmol/L — ABNORMAL LOW (ref 5–15)
BUN: 9 mg/dl (ref 7–25)
CO2: 32 mEq/L (ref 21–32)
Calcium: 8.8 mg/dl (ref 8.5–10.1)
Chloride: 106 mEq/L (ref 98–107)
Creatinine: 0.9 mg/dl (ref 0.6–1.3)
EGFR IF NonAfrican American: >60
GFR African American: >60
Glucose: 93 mg/dl (ref 74–106)
Potassium: 3.8 mEq/L (ref 3.5–5.1)
Sodium: 142 mEq/L (ref 136–145)

## 2019-03-23 MED ORDER — OXYCODONE-ACETAMINOPHEN 10 MG-325 MG TAB
10-325 mg | ORAL_TABLET | ORAL | 0 refills | Status: AC | PRN
Start: 2019-03-23 — End: 2019-04-22

## 2019-03-23 MED ORDER — RIVAROXABAN 10 MG TAB
10 mg | ORAL_TABLET | Freq: Every day | ORAL | 0 refills | Status: DC
Start: 2019-03-23 — End: 2020-02-06

## 2019-03-23 MED FILL — OXYCODONE 5 MG TAB: 5 mg | ORAL | Qty: 2

## 2019-03-23 MED FILL — OXYCODONE 5 MG TAB: 5 mg | ORAL | Qty: 4

## 2019-03-23 MED FILL — MAPAP EXTRA STRENGTH 500 MG TABLET: 500 mg | ORAL | Qty: 2

## 2019-03-23 MED FILL — PANTOPRAZOLE 40 MG TAB, DELAYED RELEASE: 40 mg | ORAL | Qty: 1

## 2019-03-23 MED FILL — OXYCONTIN 10 MG TABLET,CRUSH RESISTANT,EXTENDED RELEASE: 10 mg | ORAL | Qty: 1

## 2019-03-23 MED FILL — DOCUSATE SODIUM 100 MG CAP: 100 mg | ORAL | Qty: 1

## 2019-03-23 MED FILL — PRIMIDONE 50 MG TAB: 50 mg | ORAL | Qty: 2

## 2019-03-23 MED FILL — HYDROMORPHONE 1 MG/ML INJECTION SOLUTION: 1 mg/mL | INTRAMUSCULAR | Qty: 1

## 2019-03-23 MED FILL — GABAPENTIN 300 MG CAP: 300 mg | ORAL | Qty: 1

## 2019-03-23 MED FILL — ACETAMINOPHEN 500 MG TAB: 500 mg | ORAL | Qty: 2

## 2019-03-23 MED FILL — ZOLPIDEM 5 MG TAB: 5 mg | ORAL | Qty: 1

## 2019-03-23 MED FILL — VENLAFAXINE SR 150 MG 24 HR CAP: 150 mg | ORAL | Qty: 1

## 2019-03-23 MED FILL — GABAPENTIN 300 MG CAP: 300 mg | ORAL | Qty: 2

## 2019-03-23 NOTE — Progress Notes (Signed)
Problem: Falls - Risk of  Goal: *Absence of Falls  Description: Document Cassandra Daniels Fall Risk and appropriate interventions in the flowsheet.  Outcome: Progressing Towards Goal  Note: Fall Risk Interventions:  Mobility Interventions: Bed/chair exit alarm, Patient to call before getting OOB, PT Consult for mobility concerns, Utilize walker, cane, or other assistive device         Medication Interventions: Bed/chair exit alarm, Patient to call before getting OOB, Teach patient to arise slowly    Elimination Interventions: Bed/chair exit alarm, Call light in reach, Patient to call for help with toileting needs, Toileting schedule/hourly rounds

## 2019-03-23 NOTE — Progress Notes (Signed)
Discharge Plan:    [facility]   Per Patient prearranged prior to admission and spoke to [place] from [facility]     Discharge Date:     03/23/2019     DME- Dan Humphreys Delivered to bedside     Transportation: Family- Husband per patient      MD, patient, per patient she will update her husband, Yevonne Aline, RN and Iona Hansen from [facility] [phone]     CM loaded and matched in [facility]    Face to face routed to MD

## 2019-03-23 NOTE — Progress Notes (Signed)
PHYSICAL THERAPY         Patient: Cassandra Daniels (58 y.o. 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   Communicated with patient's nurse.    Patient declined second PT session before discharge home.    Siegrune H. Patrica Duel, PT, DPT  Pager # [phone]

## 2019-03-23 NOTE — Progress Notes (Signed)
 PHYSICAL THERAPY TREATMENT:     Patient: Cassandra Daniels (58 y.o. 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   Standard: goggles, face shield, gloves and surgical mask.    Droplet plus: goggles, face shield, N95 mask, gown and gloves.    Date: 03/23/2019  In time: 7:28  Out time:  8:17  (Today's PT session: 1/2)    Precautions: Falls. Knee precautions: do not put anything under the knee and do not twist the knee; place towel roll under ankle for gravity assisted extension.  Weight Bearing Status: weight bearing as tolerated  Equipment: rolling walker       Assessment:      Based on the objective data described below, the patient presents with  - right knee ROM 0-90 degrees  - post op pain  - amb independently in hall with walker  - active participation in therapeutic exercises and activities  - tolerance to therapeutic exercises/activities with rest breaks  - good safety awareness with functional mobility  - motivation to participate in PT to return to prior level of function   - progressing towards PT goals    Recommendations:  Recommend continued physical therapy. Recommend out of bed activity to counteract ill effects of bedrest, as well as ambulation as tolerated, with assistance as needed.  Discharge Recommendations: home with family support, home health PT.  Further Equipment Recommendations for Discharge: walker has been delivered.     Plan:      Frequency/Duration/Planned Interventions:   Patient will be followed by physical therapy to address goals, per initial plan of care.    Subjective:      Patient agreed to PT. Motivated. Thank you.  Patient reports 6/10 pain before treatment and better pain at conclusion of  treatment.  [x]           Yes, patient had pain medications - coordinated PT session with pain meds  []           No, Patient has not had pain medications  []           Nurse notified    Objective Data Summary:      Orders, labs, and chart reviewed on Cassandra Daniels. Communicated with patient's nurse, charge nurse, PA.    Patient found:     Semi reclined in bed. (-) bed alarm.    Cognitive Status:     Mental Status: Alert and oriented x 4, pleasant.  Safety/Judgement: appropriate safety awareness    Extremities Assessment:      ROM surgical knee:  0-90 degrees.    Therapeutic Activities; Functional Mobility and Balance Status:          Bed mobility:    Functional Status   Comments   Supine to sit independent     Sit to supine independent    Scooting independent        Transfers:    Functional Status   Comments   Sit to stand supervision    Stand to sit supervision    Bed to chair supervision       Ambulation/Gait Training:     Distance  a few feet from bed to chair only at this time per patient  request. States that she just got back from walking in hallway. Now wants to eat breakfast and walk again later.    Analysis  steady      Assistance  supervision and cueing for sequencing    DME  rolling walker     Stair training  not tested, breakfast arrived; patient wants to eat right now; stair training planned for next PT session      Functional Balance  Sitting      Static sitting   good    Dynamic sitting   good      Functional Balance Standing     Static standing   w/ assistive device    Dynamic standing   w/ assistive device     Therapeutic Exercises:         EXERCISE   Sets   Reps   Active Active Assist   Passive Self ROM   Ankle Pumps indepe ndent  [x]  []  []  []    Quad Sets/Glut Sets indepe ndent [x]  []  []  []    Short Arc Quads 2 10 [x]  [x]  []  []    Heel Slides 2 10 [x]  [x]  []  []    Straight Leg Raises 2 10 []  [x]  []  []    Hip Abd/Add 2 10 []  [x]  []  []    Long Arc Quads 2 10 [x]  []  []  []    Self stretch/positioning to  promote increased ROM 1 1 []  []  []  [x]      Activity Tolerance:     tolerates functional activity with rest breaks    Final Location:     Seated in bed side chair, all needs within reach. Patient agrees to call for assistance. Positioned with pillows for comfort. Patient set up with tray. LE positioned in flexion to promote increased ROM.  Ice pack to surgical knee.  Patient eating breakfast.    Communication/Education:     Education: Ill effects of bedrest, benefits of activity, OOB to chair for meals and mobilize as tolerated, activity as tolerated to counteract ill effects of bedrest, promote healing and return to prior level of function, call staff for assistance, safety, ICS use 10 times every hour while awake, deep breathing and relaxation techniques, HEP, ankle pumps to promote improved circulation and prevent DVTs, knee precautions (self stretch techniques and positioning to promote improved knee ROM), use of ice, DME, disposition, PT plan of care, all questions answered.  Opportunity for questions and clarification was provided.    Education provided to: patient     Readiness to learn indicated by: verbalized understanding and expresses confidence as she has had several previous knee surgeries, including 2 total knee surgeries.    Comprehension: Patient communicated comprehension    Barriers to learning/limitations:  None      Thank you for this referral.  Siegrune H. Rosella, PT, DPT  Pager # [phone]

## 2019-03-23 NOTE — Progress Notes (Signed)
Orthopaedic Progress Note  Post Op day: 2 Days Post-Op    March 23, 2019 8:15 AM     Patient: Cassandra Daniels MRN: [id number]  SSN: [id number]    Date of Birth: 08-Dec-1960  Age: 58 y.o.  Sex: female      Admit date:  03/21/2019  Date of Surgery:  03/21/2019   Procedures:  Procedure(s):  REVISION RIGHT TOTAL KNEE ARTHROPLASTY  Admitting Physician:  Orene Desanctis, MD   Surgeon:  Juliann Mule) and Role:     Orene Desanctis, MD - Primary    Consulting Physician(s): Treatment Team: Attending Provider: Orene Desanctis, MD; Occupational Therapist: Darrol Poke, OT; Care Manager: Kerrin Mo, RN; Physical Therapy Assistant: Kathaleen Maser., PT    SUBJECTIVE:     Cassandra Daniels is a 58 y.o. female is 2 Days Post-Op s/p Procedure(s):  REVISION RIGHT TOTAL KNEE ARTHROPLASTY with an appropriate level of post-operative pain.  No complaints of nausea, vomiting, dizziness, lightheadedness, chest pain, or shortness of breath.    OBJECTIVE:       Physical Exam:  General: Alert, cooperative, no distress.    Respiratory: Respirations unlabored  Neurological:  Neurovascular exam within normal limits.  Sensation intact to light touch right Superficial peroneal/Deep Peroneal/Posterior tibial nerves.    Musculoskeletal/Motor: Calves soft, supple, non-tender upon palpation, right Gatrocsoleus/Tibialis anterior motor intact.  Pulses:  right DP: present     Dressing/Wound:  Clean, dry and intact. No significant erythema or swelling.  Appropriate tenderness.    Vital Signs:        Patient Vitals for the past 8 hrs:   BP Temp Pulse Resp SpO2   03/23/19 0426 130/84 98.3 ??F (36.8 ??C) 82 18 96 %                                          Temp (24hrs), Avg:98.5 ??F (36.9 ??C), Min:97.8 ??F (36.6 ??C), Max:99.3 ??F (37.4 ??C)      Labs:        Recent Results (from the past 24 hour(s))   CBC WITH AUTOMATED DIFF    Collection Time: 03/23/19  5:29 AM   Result Value Ref Range    WBC 7.6 4.0 - 11.0 1000/mm3    RBC 4.32 3.60 - 5.20  M/uL    HGB 12.5 (L) 13.0 - 17.2 gm/dl    HCT 40.7 37.0 - 50.0 %    MCV 94.2 80.0 - 98.0 fL    MCH 28.9 25.4 - 34.6 pg    MCHC 30.7 30.0 - 36.0 gm/dl    PLATELET 223 140 - 450 1000/mm3    MPV 11.1 (H) 6.0 - 10.0 fL    RDW-SD 53.4 (H) 36.4 - 46.3      NRBC 0 0 - 0      IMMATURE GRANULOCYTES 0.4 0.0 - 3.0 %    NEUTROPHILS 51.0 34 - 64 %    LYMPHOCYTES 35.3 28 - 48 %    MONOCYTES 10.8 1 - 13 %    EOSINOPHILS 2.1 0 - 5 %    BASOPHILS 0.4 0 - 3 %   METABOLIC PANEL, BASIC    Collection Time: 03/23/19  5:29 AM   Result Value Ref Range    Sodium 142 136 - 145 mEq/L    Potassium 3.8 3.5 - 5.1 mEq/L    Chloride 106 98 - 107 mEq/L  CO2 32 21 - 32 mEq/L    Glucose 93 74 - 106 mg/dl    BUN 9 7 - 25 mg/dl    Creatinine 0.9 0.6 - 1.3 mg/dl    GFR est AA >60.0      GFR est non-AA >60      Calcium 8.8 8.5 - 10.1 mg/dl    Anion gap 4 (L) 5 - 15 mmol/L     Date 03/22/19 0700 - 03/23/19 0659 03/23/19 0700 - 03/24/19 0659   Shift 0700-1859 1900-0659 24 Hour Total 0700-1859 1900-0659 24 Hour Total   INTAKE   P.O. 240 180 420        P.O. 240 180 420      Shift Total(mL/kg) 240(2.7) 180(2) 420(4.8)      OUTPUT   Urine(mL/kg/hr)           Urine Occurrence(s)  3 x 3 x      Shift Total(mL/kg)         NET 240 180 420      Weight (kg) 88.3 88.3 88.3 88.3 88.3 88.3         Patient mobility yesterday:     ROM R knee 0-65 degrees.   Ambulated 150 and 100 feet.       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    Acute blood loss anemia- expected from recent surgical procedure.  Clinically stable, will monitor.      S/P REVISION RIGHT TOTAL KNEE ARTHROPLASTY PT/OT/Rehab consults for mobilization.  WBAT. TKA rehab protocol.   DVT Prophylaxis Xarelto, SCD???s, TED Hose    Pain Continue current regimen.  Oxycontin, Oxycodone, Tylenol   Discharge Disposition Home with home health PT  today.     Signed By:  Lissa Hoard, PA-C  March 23, 2019  Time: 8:15 AM

## 2019-03-23 NOTE — Progress Notes (Signed)
Problem: Falls - Risk of  Goal: *Absence of Falls  Description: Document Schmid Fall Risk and appropriate interventions in the flowsheet.  Outcome: Progressing Towards Goal  Note: Fall Risk Interventions:  Mobility Interventions: Bed/chair exit alarm, Patient to call before getting OOB, PT Consult for mobility concerns, Utilize walker, cane, or other assistive device         Medication Interventions: Bed/chair exit alarm, Patient to call before getting OOB, Teach patient to arise slowly    Elimination Interventions: Bed/chair exit alarm, Call light in reach, Patient to call for help with toileting needs, Toileting schedule/hourly rounds              Problem: Patient Education: Go to Patient Education Activity  Goal: Patient/Family Education  Outcome: Progressing Towards Goal

## 2019-03-23 NOTE — Care Coordination-Inpatient (Signed)
Nurse Navigator by Raliegh Ip, NP at 03/23/19 1039                Author: Raliegh Ip, NP  Service: Nurse Practitioner  Author Type: Nurse Practitioner       Filed: 03/23/19 1040  Date of Service: 03/23/19 1039  Status: Signed          Editor: Raliegh Ip, NP (Nurse Practitioner)                      [place] NAVIGATOR NOTE             Patient: Cassandra Daniels  Age: 58 y.o.  Sex: female          Date of Birth: [birth year]  Admit Date: 03/21/2019  PCP: Georgianne Fick, MD         MRN: [id number]   CSN: [id number]   LOS: 2 day(s)        Surgeon(s):   Orene Desanctis, MD    Procedure(s):   REVISION RIGHT TOTAL KNEE ARTHROPLASTY      Estimated Discharge date: 03/23/2019    Discharge Location: Discharge Placement: Home with home health        Subjective:     Patient : No C/O N/V/SOB, ready to go home           Objective          Vitals:             03/22/19 1504  03/22/19 2029  03/22/19 2312  03/23/19 0426           BP:  130/66  146/90  (!) 132/91  130/84     Pulse:  92  87  91  82     Resp:  18  16  16  18      Temp:  98.6 ??F (37 ??C)  98.4 ??F (36.9 ??C)  97.8 ??F (36.6 ??C)  98.3 ??F (36.8 ??C)     SpO2:  97%  97%  92%  96%     Weight:                   Height:                  Recent Labs           03/23/19   0529     WBC  7.6     HGB  12.5*     HCT  40.7     MCV  94.2        PLT  223        Physical Exam:   General: Patient alert and oriented.    Respiratory: Respirations unlabored   Neurovascular/Extremities: NVI; Positive 2+ dorsalis pedal pulses and posterior tibial bilaterally.  Dressing:  clean, dry and intact.  PT:  Walking in hallway   ROM: 0-90           Education:     Reinforce ICS use 10 times every hour while awake and continue at home for 10 days.   Maintain a normal bowel movement naturally (i.e. Ambulation, fluid, prune juice or tea).  Eat a low-salt, healthy diet to help with healing and decrease swelling.  Ankle pumps 10 times an  hour while awake.  DVT prophylaxis: Aspirin  TED  hose on for 6 weeks but may remove at night for hygiene.   Report signs  and symptoms of blood clots (calf pain, swelling, shortness of breath, cough).  Incision care : Remove Aquacel dressing in 7 days    Continue exercises as instructed by physical therapy.  Towel under her ankle if knee replacement  Follow-up appointment: 2 weeks   Continue home regimen for otherwise chronic, stable medical conditions as noted above.      All questions answered to the satisfaction of the patient/family who also verbalized understanding    of above education.         Raliegh Ip, NP   March 23, 2019    10:39 AM

## 2019-03-23 NOTE — Discharge Summary (Signed)
Total KNEE  Replacement  Discharge Summary      Patient ID:  Cassandra Daniels  [id number]  58 y.o.  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 Item Serial No. Manufacturer Lot No. LRB No. Used Action   CEMENT SIMPLEX P/TOBRAMYCIN - [id number] Cement CEMENT SIMPLEX P/TOBRAMYCIN  [employer] HOWM [id number] Right 1 Implanted   OVAL GENESIS II RESURFACING PATELLAR COMPONENT    [employer] [id number] Right 1 Implanted   COMP FERMORAL LEGION SZ 5-6 13MM HIGH FLEX - [id number] Joint Component COMP FERMORAL LEGION SZ 5-6 13MM HIGH FLEX  [employer] [id number] Right 1 Implanted       Perioperative Antibiotics: Ancef  2000mg  IV x three doses                                                    DVT Prophylaxis:  Xarelto                                  TED Hose                                    Postoperative transfusions:     none Banked PRBCs     Post Op complications: none    LAB:    Lab Results   Component Value Date/Time    INR 1.0 02/28/2019 02:17 PM     Lab Results   Component Value Date/Time    HGB 12.5 (L) 03/23/2019 05:29 AM    HGB 12.2 (L) 03/22/2019 06:40 AM    HGB 14.1 02/28/2019 02:17 PM     Lab Results   Component Value Date/Time    HCT 40.7 03/23/2019 05:29 AM    HCT 38.6 03/22/2019 06:40 AM    HCT 43.9 02/28/2019 02:17 PM         Surgery day of admission  Right TKA revision on date of admission, no complications.  Patient recovered on orthopaedic ward post-op and progressed  with PT/OT for ambulation.  Pain control was multi-modal.  Internal medicine hospitalist was consulted post-operatively.  Eating well and voiding normally by discharge.    Started on Xarelto.    Drains : none    Wound appears to be healing without any evidence of infection. No evidence of Deep venous thrombosis.    Physical Therapy started on the day following surgery and progressed to independent ambulation with the aid of a walker.  At the time of discharge, able to go up and down stairs and had understanding of precautions needed following surgery.      PT at  time of DC: WBAT and ROM as tolerated per a TKA protocol    Discharged to: Home with home health PT.    Discharge instructions:  -Anticoagulate with: Xarelto for 2 weeks  -Resume pre hospital diet            -Resume home medications per medical continuation form     -Ambulate with walker, appropriate total joint protocol  -Follow up in office as scheduled       Signed:  Lissa Hoard, PA-C  March 23, 2019  8:38 AM

## 2019-03-23 NOTE — Progress Notes (Signed)
PHYSICAL THERAPY TREATMENT:     Patient: Cassandra Daniels (58 y.o. 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   Standard: goggles, face shield, gloves and surgical mask.    Droplet plus: goggles, face shield, N95 mask, gown and gloves.    Date: 03/23/2019  In time: 7:28  Out time:  8:17  (Today's PT session: 1/2)    Precautions: Falls. Knee precautions: do not put anything under the knee and do not twist the knee; place towel roll under ankle for gravity assisted extension.  Weight Bearing Status: weight bearing as tolerated  Equipment: rolling walker       Assessment:      Based on the objective data described below, the patient presents with  - right knee ROM 0-90 degrees  - post op pain  - amb independently in hall with walker  - active participation in therapeutic exercises and activities  - tolerance to therapeutic exercises/activities with rest breaks  - good safety awareness with functional mobility  - motivation to participate in PT to return to prior level of function   - progressing towards PT goals    Recommendations:  Recommend continued physical therapy. Recommend out of bed activity to counteract ill effects of bedrest, as well as ambulation as tolerated, with assistance as needed.  Discharge Recommendations: home with family support, home health PT.  Further Equipment Recommendations for Discharge: walker has been delivered.     Plan:      Frequency/Duration/Planned Interventions:   Patient will be followed by physical therapy to address goals, per initial plan of care.    Subjective:      Patient agreed to PT. Motivated. "Thank you".  Patient reports 6/10 pain before treatment and "better" pain at conclusion  of treatment.  [x]           Yes, patient had pain medications - coordinated PT session with pain meds  []           No, Patient has not had pain medications  []           Nurse notified    Objective Data Summary:      Orders, labs, and chart reviewed on ANNALEISE BURGER. Communicated with patient's nurse, charge nurse, PA.    Patient found:     Semi reclined in bed. (-) bed alarm.    Cognitive Status:     Mental Status: Alert and oriented x 4, pleasant.  Safety/Judgement: appropriate safety awareness    Extremities Assessment:      ROM surgical knee:  0-90 degrees.    Therapeutic Activities; Functional Mobility and Balance Status:          Bed mobility:    Functional Status   Comments   Supine to sit independent     Sit to supine independent    Scooting independent        Transfers:    Functional Status   Comments   Sit to stand supervision    Stand to sit supervision    Bed to chair supervision       Ambulation/Gait Training:     Distance  a few feet from bed to chair only at this time per patient  request. States that she just got back from walking in hallway. Now wants to eat breakfast and walk again later.    Analysis  steady      Assistance  supervision and cueing for sequencing    DME  rolling walker     Stair training  not tested, breakfast arrived; patient wants to eat right now; stair training planned for next PT session      Functional Balance  Sitting      Static sitting   good    Dynamic sitting   good      Functional Balance Standing     Static standing   w/ assistive device    Dynamic standing   w/ assistive device     Therapeutic Exercises:         EXERCISE   Sets   Reps   Active Active Assist   Passive Self ROM   Ankle Pumps indepe ndent  [x]  []  []  []    Quad Sets/Glut Sets indepe ndent [x]  []  []  []    Short Arc Quads 2 10 [x]  [x]  []  []    Heel Slides 2 10 [x]  [x]  []  []    Straight Leg Raises 2 10 []  [x]  []  []    Hip Abd/Add 2 10 []  [x]  []  []    Long Arc Quads 2 10 [x]  []  []  []     Self stretch/positioning to promote increased ROM 1 1 []  []  []  [x]      Activity Tolerance:     tolerates functional activity with rest breaks    Final Location:     Seated in bed side chair, all needs within reach. Patient agrees to call for assistance. Positioned with pillows for comfort. Patient set up with tray. LE positioned in flexion to promote increased ROM.  Ice pack to surgical knee.  Patient eating breakfast.    Communication/Education:     Education: Ill effects of bedrest, benefits of activity, OOB to chair for meals and mobilize as tolerated, activity as tolerated to counteract ill effects of bedrest, promote healing and return to prior level of function, call staff for assistance, safety, ICS use 10 times every hour while awake, deep breathing and relaxation techniques, HEP, ankle pumps to promote improved circulation and prevent DVTs, knee precautions (self stretch techniques and positioning to promote improved knee ROM), use of ice, DME, disposition, PT plan of care, all questions answered.  Opportunity for questions and clarification was provided.    Education provided to: patient     Readiness to learn indicated by: verbalized understanding and expresses confidence as she has had several previous knee surgeries, including 2 total knee surgeries.    Comprehension: Patient communicated comprehension    Barriers to learning/limitations:  None      Thank you for this referral.  Siegrune H. Patrica Duel, PT, DPT  Pager # [phone]

## 2019-03-23 NOTE — Nurse Consult (Signed)
[facility]  NURSE PRACTITIONER NAVIGATOR NOTE      Patient: Cassandra Daniels Age: 58 y.o. Sex: female    Date of Birth: [birth year] Admit Date: 03/21/2019 PCP: Georgianne Fick, MD   MRN: [id number]  CSN: [id number]  LOS: 2 day(s)     Surgeon(s):  Orene Desanctis, MD   Procedure(s):  REVISION RIGHT TOTAL KNEE ARTHROPLASTY    Estimated Discharge date: 03/23/2019   Discharge Location: Discharge Placement: Home with home health    Subjective:   Patient : No C/O N/V/SOB, ready to go home      Objective     Vitals:    03/22/19 1504 03/22/19 2029 03/22/19 2312 03/23/19 0426   BP: 130/66 146/90 (!) 132/91 130/84   Pulse: 92 87 91 82   Resp: 18 16 16 18    Temp: 98.6 ??F (37 ??C) 98.4 ??F (36.9 ??C) 97.8 ??F (36.6 ??C) 98.3 ??F (36.8 ??C)   SpO2: 97% 97% 92% 96%   Weight:       Height:         Recent Labs     03/23/19  0529   WBC 7.6   HGB 12.5*   HCT 40.7   MCV 94.2   PLT 223     Physical Exam:  General: Patient alert and oriented.    Respiratory: Respirations unlabored  Neurovascular/Extremities: NVI; Positive 2+ dorsalis pedal pulses and posterior tibial bilaterally.  Dressing: clean, dry and intact.  PT:  Walking in hallway  ROM: 0-90      Education:   Reinforce ICS use 10 times every hour while awake and continue at home for 10 days.  Maintain a normal bowel movement naturally (i.e. Ambulation, fluid, prune juice or tea).  Eat a low-salt, healthy diet to help with healing and decrease swelling.  Ankle pumps 10 times an hour while awake.  DVT prophylaxis: Aspirin  TED hose on for 6 weeks but may remove at night for hygiene.  Report signs and symptoms of blood clots (calf pain, swelling, shortness of breath, cough).  Incision care : Remove Aquacel dressing in 7 days   Continue exercises as instructed by physical therapy.  Towel under her ankle if knee replacement  Follow-up appointment: 2 weeks  Continue home regimen for otherwise chronic, stable medical conditions as noted above.     All questions answered to the satisfaction of the patient/family who also verbalized understanding   of above education.      Raliegh Ip, NP  March 23, 2019   10:39 AM

## 2019-03-23 NOTE — Progress Notes (Signed)
Discharge Plan:    [facility]   Per Patient prearranged prior to admission and spoke to [place] from [facility]     Discharge Date:     03/23/2019     DME- Gilford Rile Delivered to bedside     Transportation: Family- Husband per patient      MD, patient, per patient she will update her husband, Cassandra Sarna, RN and Hiram Comber from [place] [phone]     CM loaded and matched in [place]    Face to face routed to MD

## 2019-03-23 NOTE — Progress Notes (Signed)
Orthopaedic Progress Note  Post Op day: 2 Days Post-Op    March 23, 2019 8:15 AM     Patient: Cassandra Daniels MRN: [id number]  SSN: [id number]    Date of Birth: [birth year]  Age: 58 y.o.  Sex: female      Admit date:  03/21/2019  Date of Surgery:  03/21/2019   Procedures:  Procedure(s):  REVISION RIGHT TOTAL KNEE ARTHROPLASTY  Admitting Physician:  Orene Desanctis, MD   Surgeon:  Juliann Mule) and Role:     Orene Desanctis, MD - Primary    Consulting Physician(s): Treatment Team: Attending Provider: Orene Desanctis, MD; Occupational Therapist: Darrol Poke, OT; Care Manager: Kerrin Mo, RN; Physical Therapy Assistant: Kathaleen Maser., PT    SUBJECTIVE:     Cassandra Daniels is a 58 y.o. female is 2 Days Post-Op s/p Procedure(s):  REVISION RIGHT TOTAL KNEE ARTHROPLASTY with an appropriate level of post-operative pain.  No complaints of nausea, vomiting, dizziness, lightheadedness, chest pain, or shortness of breath.    OBJECTIVE:       Physical Exam:  General: Alert, cooperative, no distress.    Respiratory: Respirations unlabored  Neurological:  Neurovascular exam within normal limits.  Sensation intact to light touch right Superficial peroneal/Deep Peroneal/Posterior tibial nerves.    Musculoskeletal/Motor: Calves soft, supple, non-tender upon palpation, right Gatrocsoleus/Tibialis anterior motor intact.  Pulses:  right DP: present     Dressing/Wound:  Clean, dry and intact. No significant erythema or swelling.  Appropriate tenderness.    Vital Signs:        Patient Vitals for the past 8 hrs:   BP Temp Pulse Resp SpO2   03/23/19 0426 130/84 98.3 ??F (36.8 ??C) 82 18 96 %                                          Temp (24hrs), Avg:98.5 ??F (36.9 ??C), Min:97.8 ??F (36.6 ??C), Max:99.3 ??F (37.4 ??C)      Labs:        Recent Results (from the past 24 hour(s))   CBC WITH AUTOMATED DIFF    Collection Time: 03/23/19  5:29 AM   Result Value Ref Range    WBC 7.6 4.0 - 11.0 1000/mm3     RBC 4.32 3.60 - 5.20 M/uL    HGB 12.5 (L) 13.0 - 17.2 gm/dl    HCT 40.7 37.0 - 50.0 %    MCV 94.2 80.0 - 98.0 fL    MCH 28.9 25.4 - 34.6 pg    MCHC 30.7 30.0 - 36.0 gm/dl    PLATELET 223 140 - 450 1000/mm3    MPV 11.1 (H) 6.0 - 10.0 fL    RDW-SD 53.4 (H) 36.4 - 46.3      NRBC 0 0 - 0      IMMATURE GRANULOCYTES 0.4 0.0 - 3.0 %    NEUTROPHILS 51.0 34 - 64 %    LYMPHOCYTES 35.3 28 - 48 %    MONOCYTES 10.8 1 - 13 %    EOSINOPHILS 2.1 0 - 5 %    BASOPHILS 0.4 0 - 3 %   METABOLIC PANEL, BASIC    Collection Time: 03/23/19  5:29 AM   Result Value Ref Range    Sodium 142 136 - 145 mEq/L    Potassium 3.8 3.5 - 5.1 mEq/L    Chloride 106 98 - 107 mEq/L  CO2 32 21 - 32 mEq/L    Glucose 93 74 - 106 mg/dl    BUN 9 7 - 25 mg/dl    Creatinine 0.9 0.6 - 1.3 mg/dl    GFR est AA >60.0      GFR est non-AA >60      Calcium 8.8 8.5 - 10.1 mg/dl    Anion gap 4 (L) 5 - 15 mmol/L     Date 03/22/19 0700 - 03/23/19 0659 03/23/19 0700 - 03/24/19 0659   Shift 0700-1859 1900-0659 24 Hour Total 0700-1859 1900-0659 24 Hour Total   INTAKE   P.O. 240 180 420        P.O. 240 180 420      Shift Total(mL/kg) 240(2.7) 180(2) 420(4.8)      OUTPUT   Urine(mL/kg/hr)           Urine Occurrence(s)  3 x 3 x      Shift Total(mL/kg)         NET 240 180 420      Weight (kg) 88.3 88.3 88.3 88.3 88.3 88.3         Patient mobility yesterday:     ROM R knee 0-65 degrees.   Ambulated 150 and 100 feet.       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    Acute blood loss anemia- expected from recent surgical procedure.  Clinically stable, will monitor.      S/P REVISION RIGHT TOTAL KNEE ARTHROPLASTY PT/OT/Rehab consults for mobilization.  WBAT. TKA rehab protocol.   DVT Prophylaxis Xarelto, SCD???s, TED Hose    Pain Continue current regimen.  Oxycontin, Oxycodone, Tylenol    Discharge Disposition Home with home health PT today.     Signed By:  Lissa Hoard, PA-C  March 23, 2019  Time: 8:15 AM

## 2019-03-23 NOTE — Progress Notes (Signed)
PHYSICAL THERAPY         Patient: Cassandra Daniels (58 y.o. 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   Communicated with patient's nurse.    Patient declined second PT session before discharge home.    Siegrune H. Patrica Duel, PT, DPT  Pager # [phone]

## 2019-03-23 NOTE — Other (Addendum)
----------  DocumentID: [id number]------------------------------------------------              [facility]                       Patient Education Report         Name: Cassandra Daniels, Cassandra Daniels                  Date: 03/21/2019    MRN: [id number]                    Time: 3:50:33 PM         Patient ordered video: 'Patient Safety: Stay Safe While you are in the Hospital'    from [id number] via phone number: [phone] at 3:50:33 PM    Description: This program outlines some of the precautions patients can take to ensure a speedy recovery without extra complications. The video emphasizes the importance of communicating with the healthcare team.    ----------DocumentID: [id number]------------------------------------------------                       [facility]          Patient Education Report - Discharge Summary        Date: 03/23/2019   Time: 10:55:35 AM   Name: Cassandra Daniels, Cassandra Daniels   MRN: [id number]      Account Number: [id number]      Education History:        Patient ordered video: 'Patient Safety: Stay Safe While you are in the Hospital' from [id number] on 03/21/2019 03:50:33 PM

## 2019-03-23 NOTE — Discharge Summary (Signed)
Total KNEE  Replacement  Discharge Summary      Patient ID:  Cassandra Daniels  [id number]  58 y.o.  August 02, 1961    Admit date: 03/21/2019    Discharge date and time: 03/23/2019     Admitting Physician: Orene Desanctis, MD     Discharge Physician: Lissa Hoard, PA-C    Admission Diagnoses: Presence of right artificial knee joint [Z96.651]  Pain due to total right knee replacement ([place]) [T84.84XA, Z96.651]  Pain due to total right knee replacement ([place]) [B28.41LK, Earl    Discharge Diagnoses: Active Problems:    Pain due to total right knee replacement ([place]) (03/21/2019)        Surgeon: Lissa Hoard, PA-C    IMPLANTS:   Implant Name Type Inv. Item Serial No. Manufacturer Lot No. LRB No. Used Action   CEMENT SIMPLEX P/TOBRAMYCIN - [id number] Cement CEMENT SIMPLEX P/TOBRAMYCIN  [employer] HOWM [id number] Right 1 Implanted   OVAL GENESIS II RESURFACING PATELLAR COMPONENT    [employer] [id number] Right 1 Implanted   COMP FERMORAL LEGION SZ 5-6 13MM HIGH FLEX - [id number] Joint Component COMP FERMORAL LEGION SZ 5-6 13MM HIGH FLEX  [employer] [id number] Right 1 Implanted       Perioperative Antibiotics: Ancef  2000mg  IV x three doses                                                    DVT Prophylaxis:  Xarelto                                  TED Hose                                    Postoperative transfusions:     none Banked PRBCs     Post Op complications: none    LAB:    Lab Results   Component Value Date/Time    INR 1.0 02/28/2019 02:17 PM     Lab Results   Component Value Date/Time    HGB 12.5 (L) 03/23/2019 05:29 AM    HGB 12.2 (L) 03/22/2019 06:40 AM    HGB 14.1 02/28/2019 02:17 PM     Lab Results   Component Value Date/Time    HCT 40.7 03/23/2019 05:29 AM    HCT 38.6 03/22/2019 06:40 AM    HCT 43.9 02/28/2019 02:17 PM         Surgery day of admission  Right TKA revision on date of admission, no complications.  Patient  recovered on orthopaedic ward post-op and progressed with PT/OT for ambulation.  Pain control was multi-modal.  Internal medicine hospitalist was consulted post-operatively.  Eating well and voiding normally by discharge.    Started on Xarelto.    Drains : none    Wound appears to be healing without any evidence of infection. No evidence of Deep venous thrombosis.    Physical Therapy started on the day following surgery and progressed to independent ambulation with the aid of a walker.  At the time of discharge, able to go up and down stairs and had understanding of precautions needed following surgery.      PT at  time of DC: WBAT and ROM as tolerated per a TKA protocol    Discharged to: Home with home health PT.    Discharge instructions:  -Anticoagulate with: Xarelto for 2 weeks  -Resume pre hospital diet            -Resume home medications per medical continuation form     -Ambulate with walker, appropriate total joint protocol  -Follow up in office as scheduled       Signed:  Lissa Hoard, PA-C  March 23, 2019  8:38 AM

## 2020-02-06 NOTE — H&P (Signed)
Gynecology History and Physical    Name: Cassandra Daniels MRN: [id number] SSN: [id number]    Date of Birth: 21-Feb-1961  Age: 59 y.o.  Sex: female       Subjective:      Chief complaint:  Left vulvar cyst    Cassandra Daniels is a 59 y.o. Caucasian female with a history of let vulvar cyst. Previous treatment measures included I&D of vulvar cyst. She is admitted for Procedure(s) (LRB):  EXCISION OF LEFT LABIAL CYST (Left).      OB History    No obstetric history on file.       Past Medical History:   Diagnosis Date   ??? Adverse effect of anesthesia     slow to come out of anesthesia.   ??? Anxiety    ??? Arthritis    ??? Atherosclerotic cardiovascular disease    ??? Bilateral knee pain    ??? Broken nose 1999    surgery   ??? Carpal tunnel syndrome    ??? Chronic fatigue    ??? Fibrocystic breast     bilateral    ??? Fibromyalgia    ??? Gastric bypass status for obesity 2009   ??? GERD (gastroesophageal reflux disease)    ??? Left groin pain    ??? Left hip pain    ??? Meningioma nos    ??? MVP (mitral valve prolapse)    ??? Pain management    ??? Pancreatitis 2014   ??? Paroxysmal atrial tachycardia ([place])    ??? Paroxysmal tachycardia ([place])    ??? PVC (premature ventricular contraction)    ??? Right hand pain    ??? Right hip pain    ??? Right shoulder pain    ??? Stroke ([place]) 2014/2019    no residual effects   ??? Unspecified adverse effect of anesthesia     difficulty waking up    ??? Unspecified sleep apnea     does not use cpap   ??? Vitamin B 12 deficiency      Past Surgical History:   Procedure Laterality Date   ??? ABLATION OF SVT  2007    by Dr. Serita Sheller   ??? HX BLADDER REPAIR  1996   ??? HX CHOLECYSTECTOMY  2014   ??? HX GASTRIC BYPASS  11/07   ??? HX HYSTERECTOMY     ??? HX KNEE ARTHROSCOPY      x5   ??? HX RHINOPLASTY     ??? HX SHOULDER ARTHROSCOPY     ??? MAM BIOPSY BREAST STEREOTACTIC  april, 2013    right, benign     Social History     Occupational History   ??? Not on file   Tobacco Use   ??? Smoking status: Never Smoker   ??? Smokeless tobacco: Never Used   Substance and Sexual Activity   ???  Alcohol use: Not Currently     Alcohol/week: 0.0 standard drinks   ??? Drug use: No   ??? Sexual activity: Yes     Family History   Problem Relation Age of Onset   ??? Asthma Mother    ??? Breast Cancer Mother 41   ??? Cancer Mother    ??? Other Father         cabg   ??? Heart Disease Father    ??? High Cholesterol Father    ??? Heart Attack Maternal Grandmother 53   ??? Breast Cancer Maternal Grandmother 50        Allergies   Allergen Reactions   ???  Beta Blocker [Beta-Blockers (Beta-Adrenergic Blocking Agts)] Other (comments)     Fatigue   ??? Nsaids (Non-Steroidal Anti-Inflammatory Drug) Unknown (comments)     Caution h/o gastric bypass 2006   ??? Tetracycline Hives     Prior to Admission medications    Medication Sig Start Date End Date Taking? Authorizing Provider   oxyCODONE-acetaminophen (Percocet) 5-325 mg per tablet Take  by mouth every six (6) hours as needed for Pain.   Yes Provider, Historical   hydrOXYzine HCL (ATARAX) 10 mg tablet Take  by mouth three (3) times daily as needed.   Yes Provider, Historical   ergocalciferol (Vitamin D2) 1,250 mcg (50,000 unit) capsule Take 50,000 Units by mouth daily.   Yes Provider, Historical   metFORMIN (GLUCOPHAGE) 500 mg tablet Take 500 mg by mouth every evening. Appetite suppressant for gastric bypass    Provider, Historical   traMADoL (ULTRAM-ER) 100 mg tablet Take 200 mg by mouth nightly.    Provider, Historical   gabapentin (NEURONTIN) 300 mg capsule Take 300 mg by mouth two (2) times a day. 600mg  in am 300mg  in afternoon    Provider, Historical   gabapentin (NEURONTIN) 600 mg tablet Take  by mouth nightly.    Provider, Historical   venlafaxine-SR (EFFEXOR-XR) 150 mg capsule Take 150 mg by mouth daily. am    Provider, Historical   primidone (MYSOLINE) 50 mg tablet Take 100 mg by mouth daily. am    Provider, Historical   cyclobenzaprine (FLEXERIL) 10 mg tablet Take  by mouth three (3) times daily as needed for Muscle Spasm(s).    Provider, Historical   diclofenac (VOLTAREN) 1 % gel Apply   to affected area as needed.    Provider, Historical   hyoscyamine SL (LEVSIN/SL) 0.125 mg SL tablet Take 1 Tab by mouth every six (6) hours as needed for Cramping. 04/07/11   Basta, Baher A, MD   CYANOCOBALAMIN, VITAMIN B-12, (VITAMIN B-12 PO) Take 1 Tab by mouth every seven (7) days. 11/06/09   Provider, Historical        Review of Systems:  A comprehensive review of systems was negative.     Objective:     Vitals:    02/06/20 1159   Weight: 83.9 kg (185 lb)   Height: 5\' 2"  (1.575 m)       Physical Exam:  Patient without distress.  Heart: Regular rate and rhythm or without murmur or extra heart sounds  Lung: clear to auscultation throughout lung fields, no wheezes, no rales, no rhonchi and normal respiratory effort  Abdomen: soft, nontender  External Genitalia: normal general appearance and 1 cm cyst on left labia majora  Vagina: normal mucosa without prolapse or lesions    Assessment:     Left labial cyst    Plan:     Procedure(s) (LRB):  EXCISION OF LEFT LABIAL CYST (Left)  Discussed the risks of surgery including the risks of bleeding, infection, deep vein thrombosis, and surgical injuries to internal organs including but not limited to the bowels, bladder, rectum, and female reproductive organs. The patient understands the risks; any and all questions were answered to the patient's satisfaction.    Signed By:  Twana First, MD     February 06, 2020

## 2020-02-06 NOTE — Interval H&P Note (Signed)
PREOPERATIVE INSTRUCTIONS  Please Read Carefully    [x]  Your procedure is scheduled on: 02/07/2020    [x]  The day before your surgery, call the surgeon's office to check on the surgery time and the time you should arrive.     [x]  On the day of your surgery, arrive at the time given to you by your surgeon and check in at the first floor registration desk.    [x]  DO NOT eat or drink anything after midnight before your surgery. This includes gum, mints or hard candy.    [x]  You may brush your teeth the morning of surgery, however DO NOT swallow any water.    []  No smoking after midnight.  Smoking should be reduced a few days prior to surgery.    [x]  If you are having an outpatient procedure, you must have a responsible adult bring you to the hospital.  They must remain in the surgical waiting area for the duration of your procedure and provide you with transportation home.  You may not drive yourself home.  We also recommend that you arrange for a responsible person to stay with you for 24 hours following your procedure.  Failure to comply with these instructions may result in cancellation of the procedure.    []  A parent or legal guardian must accompany minors to the hospital.  LEGAL GUARDIANS MUST bring custody papers with them the day of surgery.    []  If the lab gives you a blue blood ID band, do not throw it away.  Bring it with you on the day of surgery.      []  Please return to preop surgical testing no more than 14 days before surgery date to have your type and screen done prior to surgery.    [x]  If you have been diagnosed with Sleep Apnea and are using a CPAP, BiPAP, and/or Bi-Flex machine, please bring it with you the day of surgery.    []  Endoscopy patients, please follow the instructions provided by the doctors office.    []  If crutches are ordered, you must get them and be instructed on proper use before the day of surgery.  Please bring them with you the day of surgery.      PREPARING THE SKIN  BEFORE SURGERY Can Reduce the Risk of Infection  []  You have been given a Chlorhexidine skin preparation packet with instructions to bathe the night before surgery and the morning of surgery prior to arrival.    [x]  If allergic to Chlorhexidine, use Dial (antibacterial) soap to bathe your skin the night before surgery and the morning of surgery.    [x]  Do not shave your face, underarms, legs or any part of your body at least 48 hours before surgery.  Any clipping needed for surgery will be done at the hospital.      PREPARING TO COME TO THE HOSPITAL  [x]  Wear casual, loose fitting comfortable clothes the day of surgery.    [x]  Remove ALL jewelry, including body piercings.  Leave these and all valuables at home.  Leave suitcases and/or overnight bag at home or in the car for a family member to bring when you get a room.    [x]  DO NOT wear any makeup, nail polish, deodorant, body lotion, aftershave or contact lenses the day of surgery.  Please bring your glasses and glasses case with you the day of surgery.    [x]  Bring any additional paperwork, such as doctors orders, consent form, POA (  power of attorney), and/or advanced directives with you the day of surgery.    [x]  Please notify your surgeon of any changes in your condition such as fever, sore throat or rash as soon as possible before your surgery date.  After office hours, call your surgeon's answering service.    [x] Bring picture identification and insurance cards with you the day of surgery.      MEDICATIONS:  [x]  Stop taking aspirin and aspirin products/NSAIDs (non-steroidal anti-inflammatory drugs such as Motrin, Aleve, Advil, ibuprofen and BC Powder), vitamins and herbal pills 2 weeks before surgery OR as instructed by your doctor.  However, if you take a daily aspirin, please contact your primary care provider (PCP), for instructions prior to surgery.    []  Contact your doctor regarding any blood thinner medications you take (such as Coumadin, Plavix, etc.)  for instructions about what to do prior to surgery.    [] If you have diabetes, hold oral diabetic medications the night before surgery and the morning of surgery.  Hold insulin the morning of surgery unless told otherwise by your doctor.    []  If you have asthma or use inhalers please bring them the day of surgery.    [x] Take the following medicine with a sip of water the day of surgery: 02/07/2020    [x] Take the following home medications:  Medication Documentation Review Audit     Reviewed by Trinna Post, RN (Registered Nurse) on 02/06/20 at 1216    Medication Sig Documenting Provider Last Dose Status Taking?   CYANOCOBALAMIN, VITAMIN B-12, (VITAMIN B-12 PO) Take 1 Tab by mouth every seven (7) days. Provider, Historical  Active    cyclobenzaprine (FLEXERIL) 10 mg tablet Take  by mouth three (3) times daily as needed for Muscle Spasm(s). Provider, Historical  Active    diclofenac (VOLTAREN) 1 % gel Apply  to affected area as needed. Provider, Historical  Active    gabapentin (NEURONTIN) 300 mg capsule Take 300 mg by mouth two (2) times a day. 600mg  in am 300mg  in afternoon Provider, Historical  Active yes   gabapentin (NEURONTIN) 600 mg tablet Take  by mouth nightly. Provider, Historical  Active    hyoscyamine SL (LEVSIN/SL) 0.125 mg SL tablet Take 1 Tab by mouth every six (6) hours as needed for Cramping. Basta, Baher A, MD  Active Yes if needed   metFORMIN (GLUCOPHAGE) 500 mg tablet Take 500 mg by mouth every evening. Appetite suppressant for gastric bypass Provider, Historical  Active    oxyCODONE-acetaminophen (Percocet) 5-325 mg per tablet Take  by mouth every six (6) hours as needed for Pain. Provider, Historical  Active Yes   primidone (MYSOLINE) 50 mg tablet Take 100 mg by mouth daily. am Provider, Historical  Active yes   traMADoL (ULTRAM-ER) 100 mg tablet Take 200 mg by mouth nightly. Provider, Historical  Active    venlafaxine-SR [facility]) 150 mg capsule Take 150 mg by mouth daily. am Provider,  Historical  Active yes                 *Visitor Policy-One visitor per patient, must be 52 years of age or older, must wear a mask. Upon check in, the patient must arrive with their post procedure transportation home. The post-procedure transporting party must remain within the surgical waiting room for the duration of the procedure. Failure may result in the cancellation of the procedure*      We want you to have a positive experience at [facility].  If any of these these instructions are not met, it is possible that your surgery will be canceled.  If you have any questions regarding your surgery, please call [facility] at [phone] or your surgeon's office for further assistance.

## 2021-05-29 IMAGING — MR MRI LEFT HIP WITHOUT CONTRAST
4 of 7 series · 12 of 40 positions shown · IV contrast (gadolinium)
Comparison: None

________________________________________________________________________________________________ 
MRI LEFT HIP WITHOUT CONTRAST, 05/29/2021 [DATE]: 
CLINICAL INDICATION: Localized swelling, mass and lump, left lower limb.
TECHNIQUE: Multiplanar, multiecho position MR images of the hip were performed 
without intravenous gadolinium enhancement. Large field-of-view images were 
performed of the pelvis to include the contralateral hip for comparison. Patient 
was scanned on a 3T magnet.

[Series 401: PD fat-sat · axial · 4.0mm · 0.33mm/px · z∈[-76,+39]mm · 3 of 32 slices shown (1 of 3)]
[im 7/32]
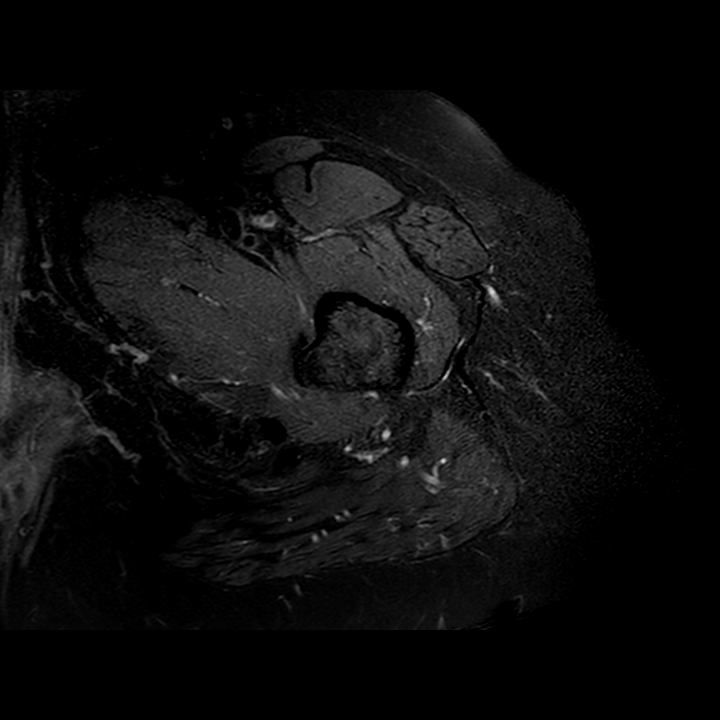
[im 19/32]
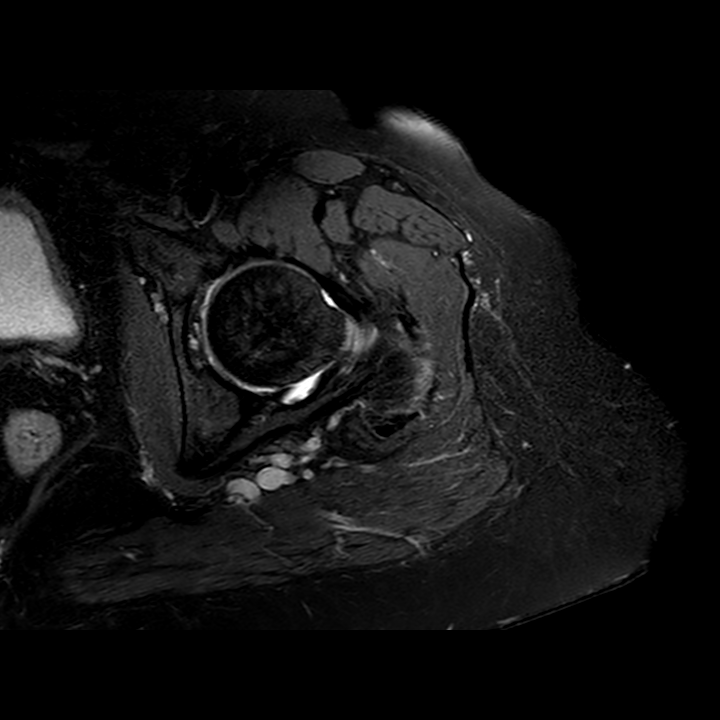
[im 32/32]
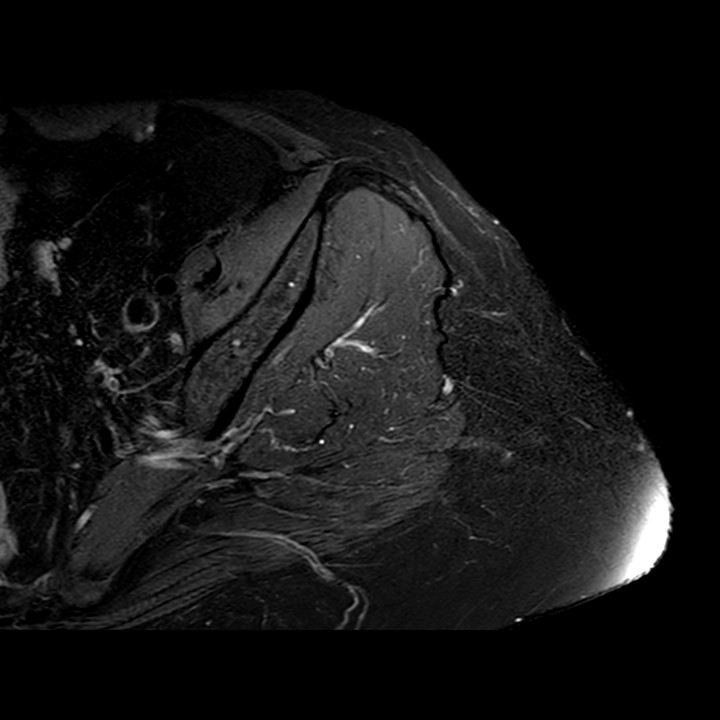

[Series 501: PD · sagittal · 4.0mm · 0.43mm/px · 3 of 44 slices shown]
[im 7/44]
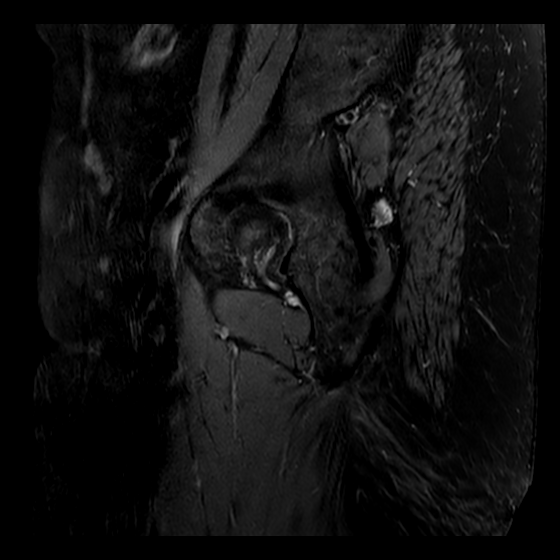
[im 25/44]
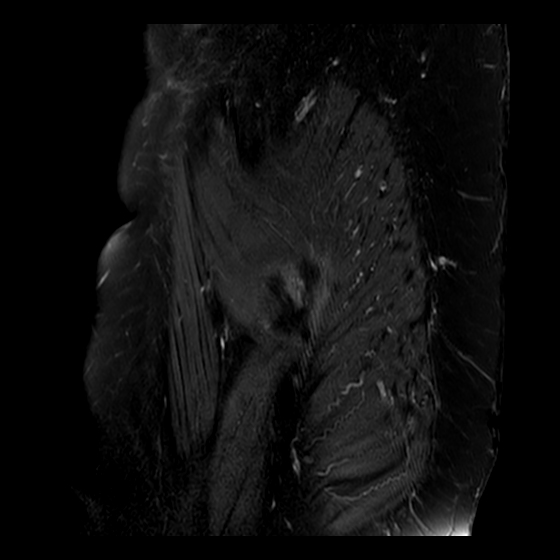
[im 37/44]
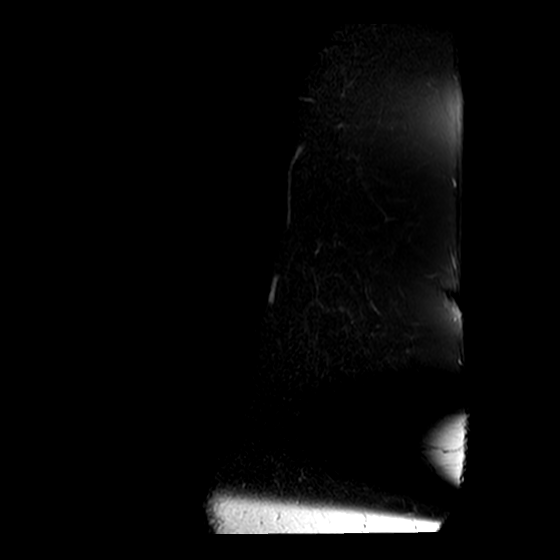

[Series 601: PD fat-sat · coronal · 3.5mm · 0.43mm/px · 3 of 25 slices shown (2 of 3)]
[im 1/25]
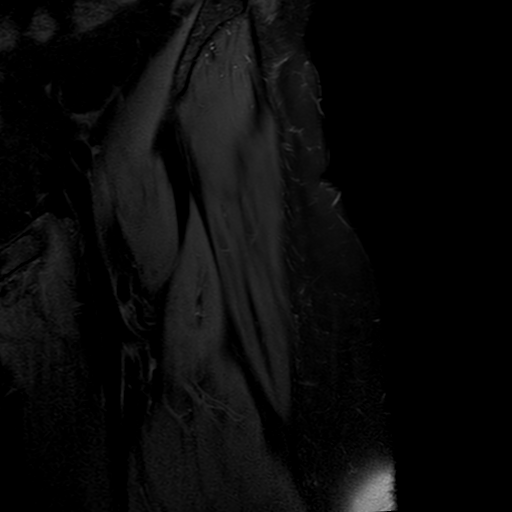
[im 13/25]
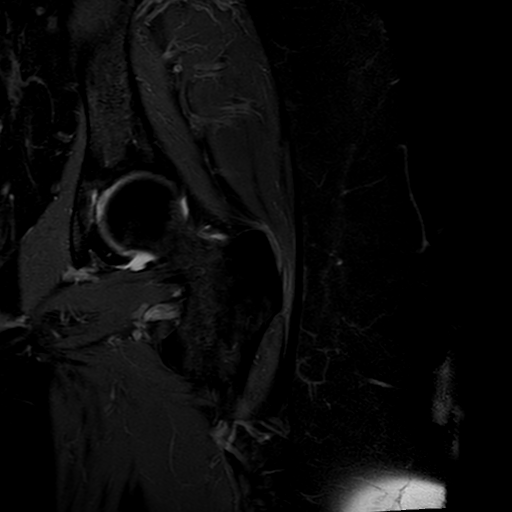
[im 25/25]
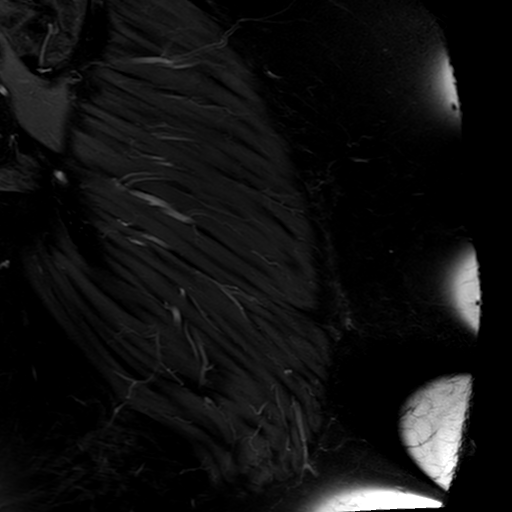

[Series 701: PD fat-sat · axial · 4.0mm · 0.33mm/px · z∈[-78,+14]mm · 3 of 21 slices shown (3 of 3)]
[im 1/21]
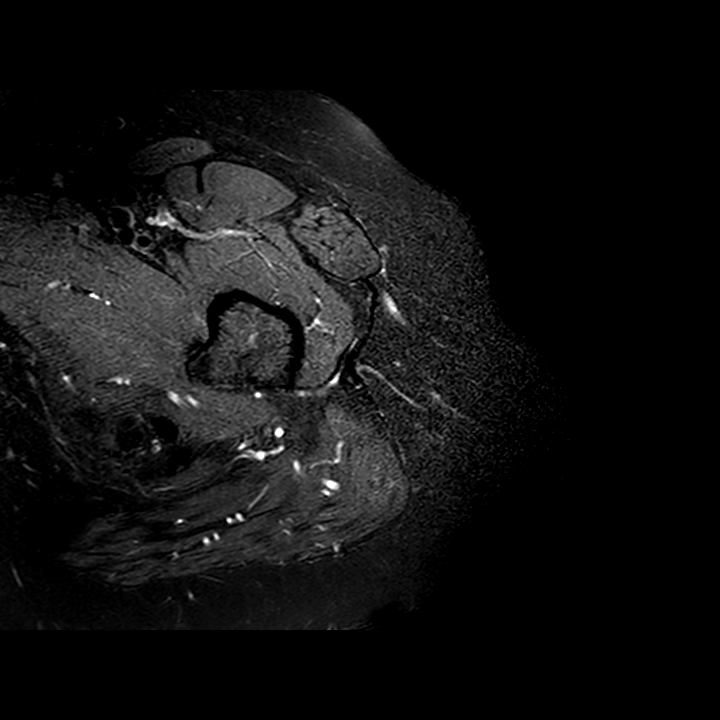
[im 14/21]
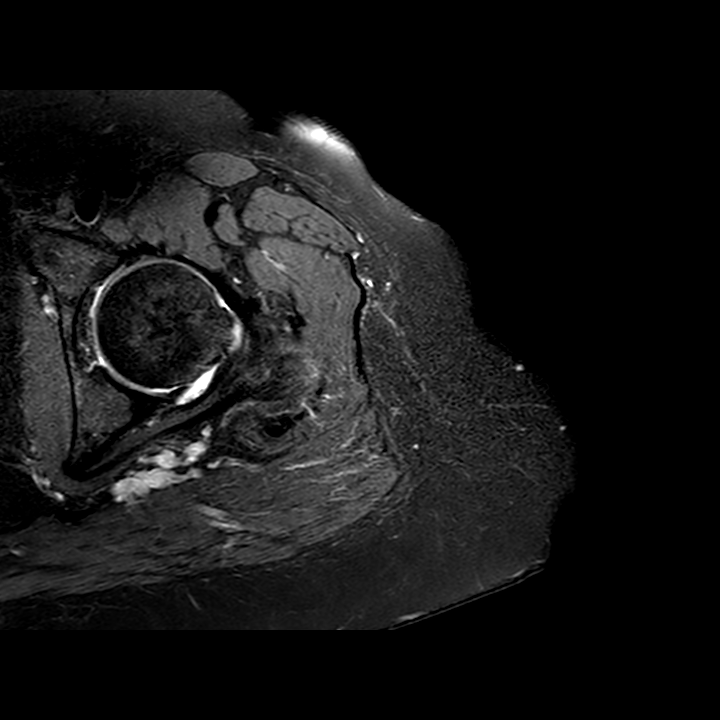
[im 21/21]
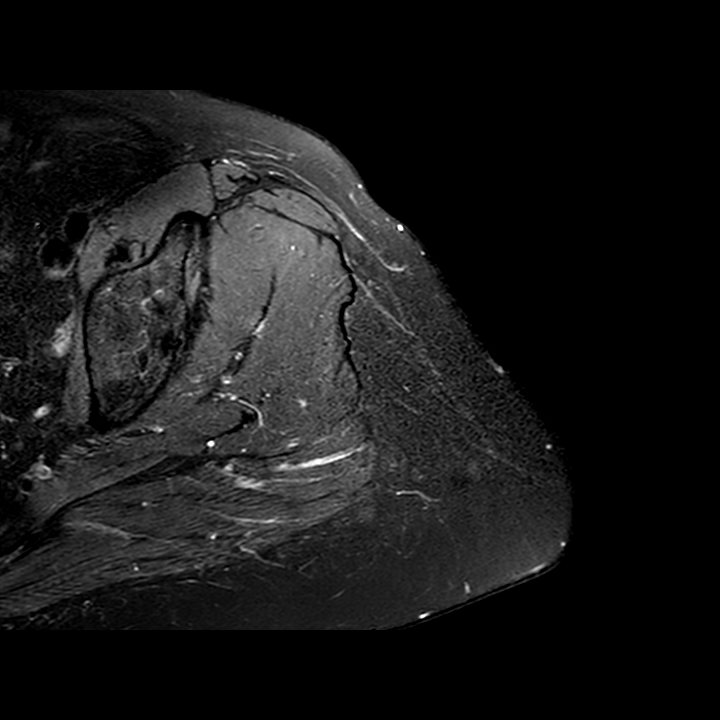

[12 of 40 positions shown; findings below may reference images not displayed]

FINDINGS: RIGHT HIP: Susceptibility artifact from right THA with acetabular screw. Right 
hip joint effusion with anterolateral extension into the deep musculature along 
the incision. No periprosthetic fracture or osteolysis. 
LEFT HIP: No discrete articular cartilaginous loss of the hip. No labral tear. 
No paralabral cyst. No hip joint effusion. Femoral head maintains a spherical 
configuration without evidence of avascular necrosis or subarticular collapse. 
No abnormal morphology of the proximal femur or acetabulum to predispose to 
impingement. 
PELVIC BONES: Normal marrow signal intensity. No fracture, contusion or marrow 
replacing lesion.  
SI JOINTS: SI joints are preserved.  
PUBIC SYMPHYSIS: Mild degenerative change. 
SPINE: Multilevel degenerative change of the spine. 3.8 cm S1 body hemangioma. 
SOFT TISSUES: Left lateral skin marker indicates the site of symptoms. Mild 
underlying deep subcutaneous soft tissue swelling, without mass or fluid 
collection. Mild tendinosis of the bilateral distal gluteus minimus tendons with 
tendon thickening, intermediate signal and mild peritendinous edema. The 
abductor cuffs are otherwise preserved without high-grade interstitial tear. 
There is trace fluid overlying the greater trochanters without overt 
trochanteric bursitis. The origins of the hamstrings are intact. The rectus 
abdominis-adductor aponeurotic complexes are intact. No mass, free fluid or 
adenopathy. 1 cm left adnexal simple cyst.
IMPRESSION: 1.  Mild left lateral deep subcutaneous soft tissue swelling region of interest, 
without mass or fluid collection.  
2.  Right total hip arthroplasty and joint effusion. 
3.  Spine degenerative change of the spine and pubic symphysis. 
4.  1 cm left adnexal simple cyst.

## 2021-05-29 IMAGING — MR MRI BRAIN W/WO CONTRAST
5 of 13 series · 20 of 48 positions shown · IV contrast (gadavist)
Comparison: None

________________________________________________________________________________________________ 
MRI BRAIN W/WO CONTRAST, 05/29/2021 [DATE]: 
CLINICAL INDICATION: 60-year-old female follow-up for asymptomatic neoplasm.
TECHNIQUE: Multiplanar, multiecho position MR images of the brain were performed 
without and with 8.5 mL of Gadavist contrast. Patient was scanned on a 3T 
magnet.

[Series 401: FLAIR · axial · 5.0mm · 0.60mm/px · z∈[-70,+83]mm · 3 of 27 slices shown]
[im 1/27]
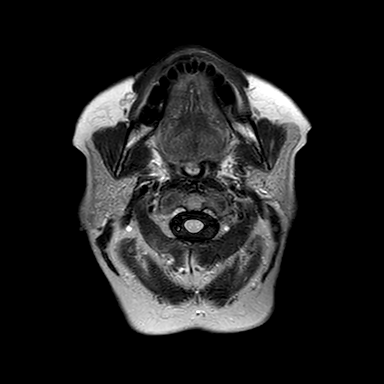
[im 14/27]
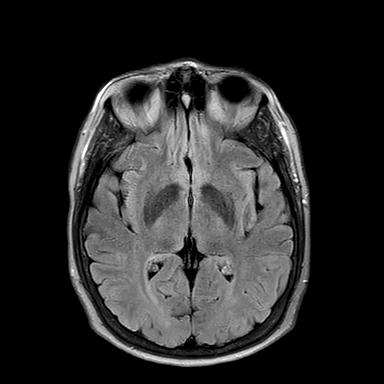
[im 27/27]
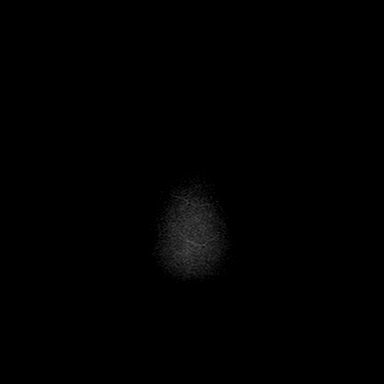

[Series 601: SWI · axial · 3.0mm · 0.53mm/px · z∈[-64,+66]mm · 7 of 100 slices shown]
[im 1/100]
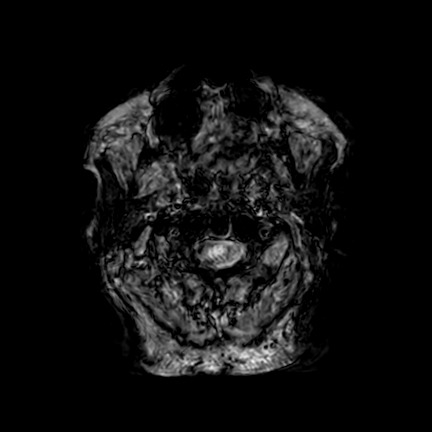
[im 12/100]
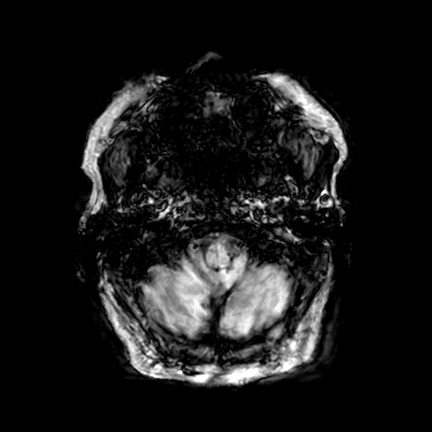
[im 34/100]
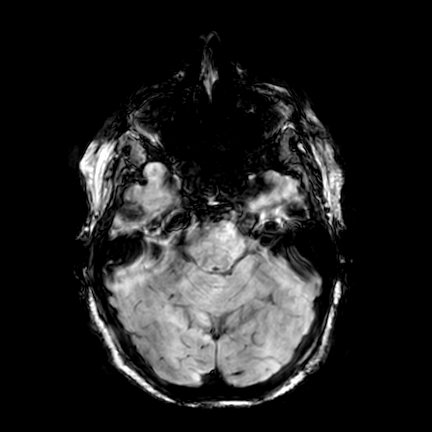
[im 45/100]
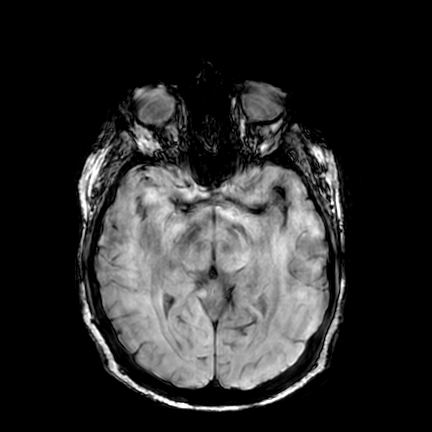
[im 56/100]
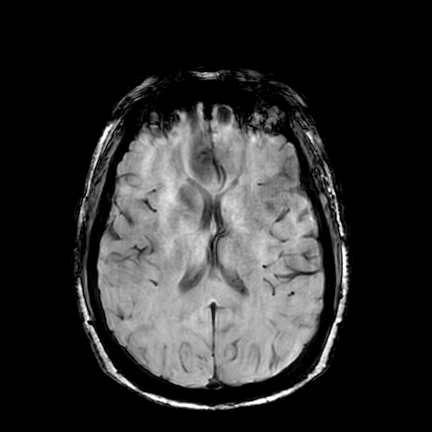
[im 67/100]
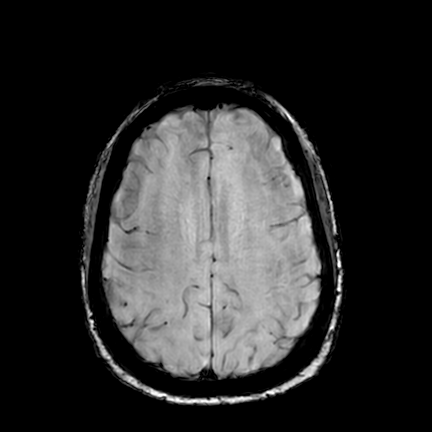
[im 89/100]
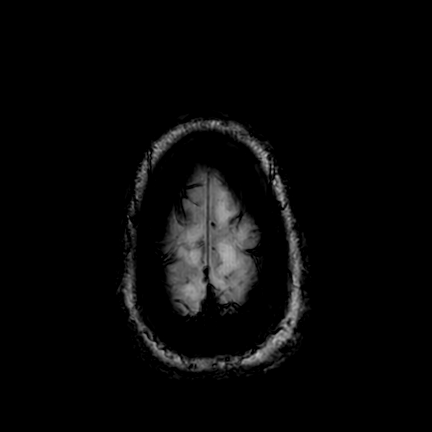

[Series 701: T2 · axial · 5.0mm · 0.41mm/px · z∈[-70,+83]mm · 3 of 27 slices shown]
[im 1/27]
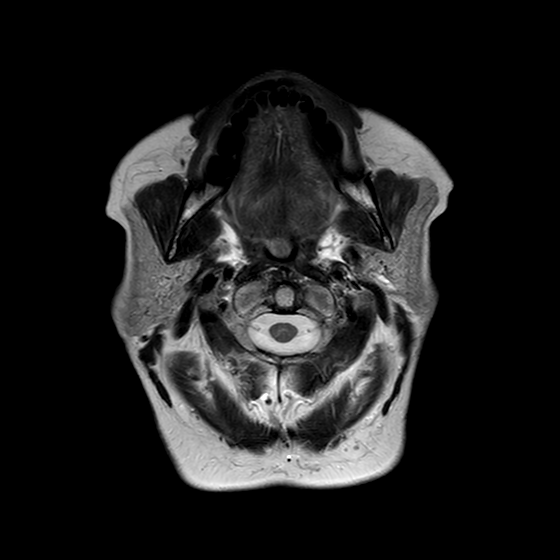
[im 14/27]
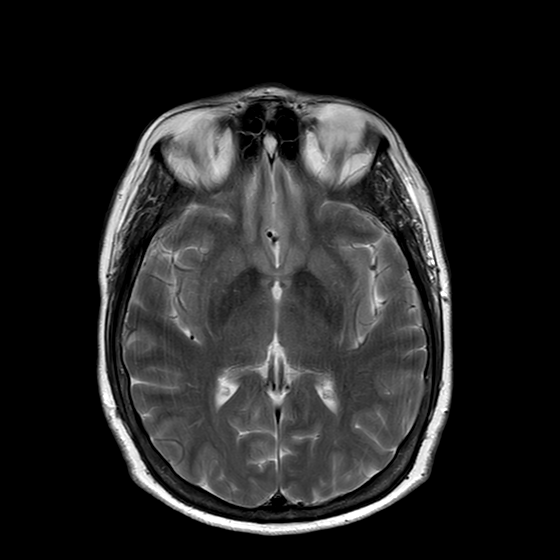
[im 27/27]
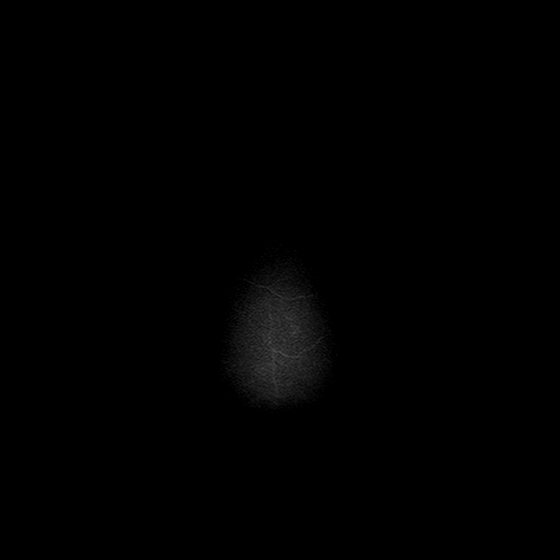

[Series 901: T1 post-contrast · coronal · 4.0mm · 0.38mm/px · 4 of 36 slices shown (1 of 2)]
[im 1/36]
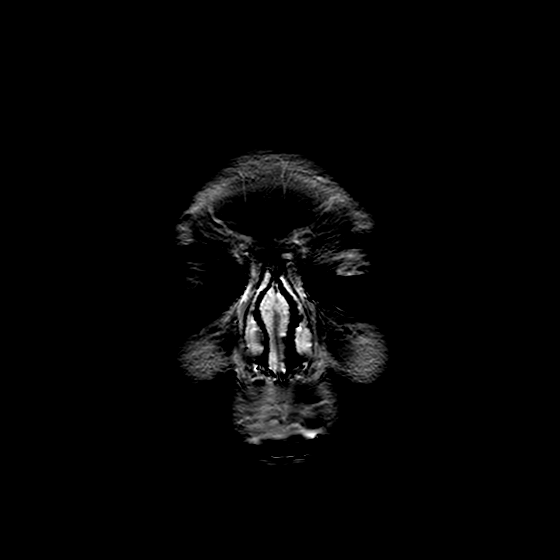
[im 12/36]
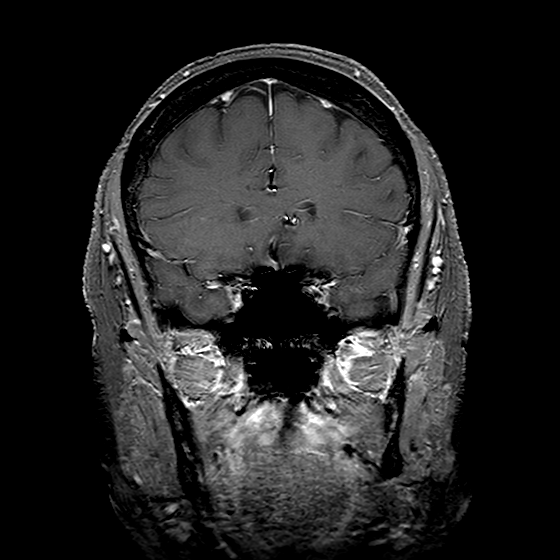
[im 24/36]
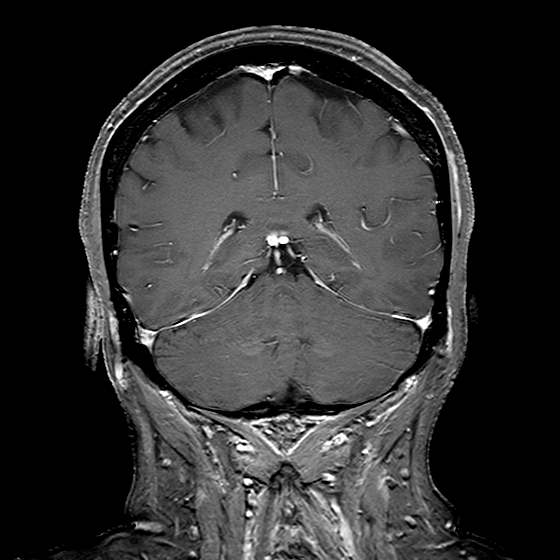
[im 36/36]
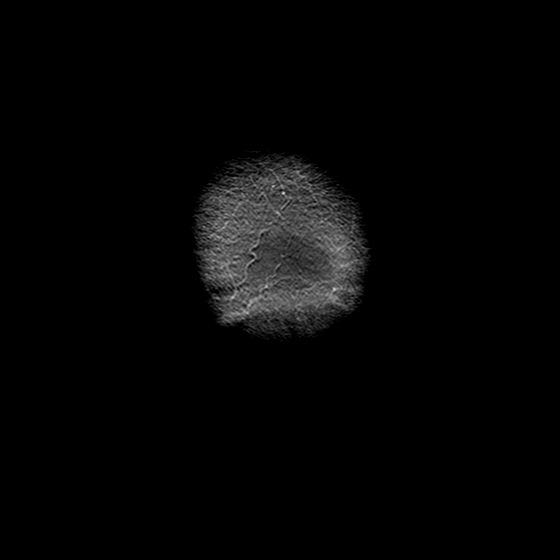

[Series 1001: T1 post-contrast · sagittal · 4.0mm · 0.34mm/px · 3 of 29 slices shown (2 of 2)]
[im 1/29]
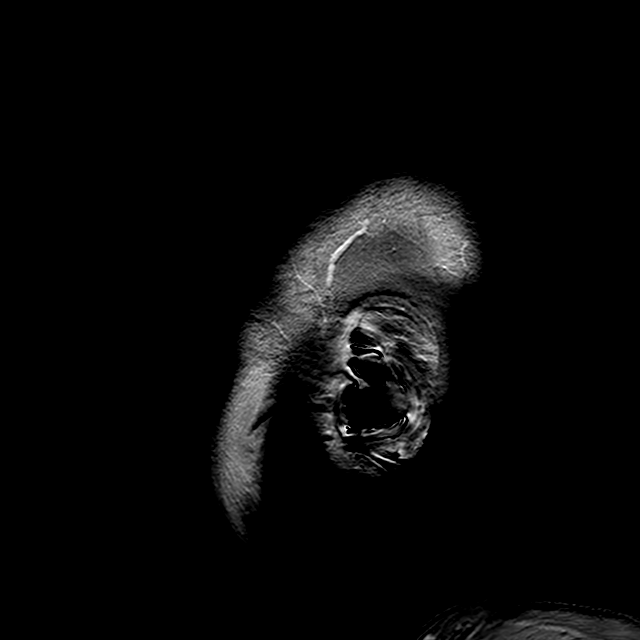
[im 15/29]
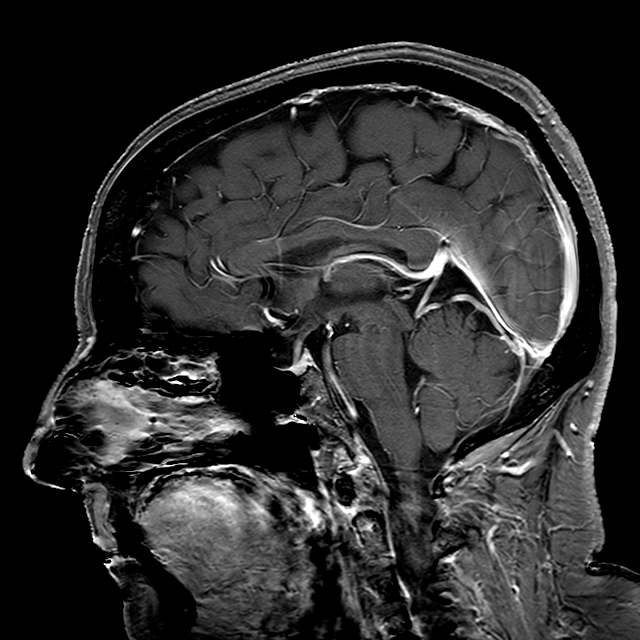
[im 29/29]
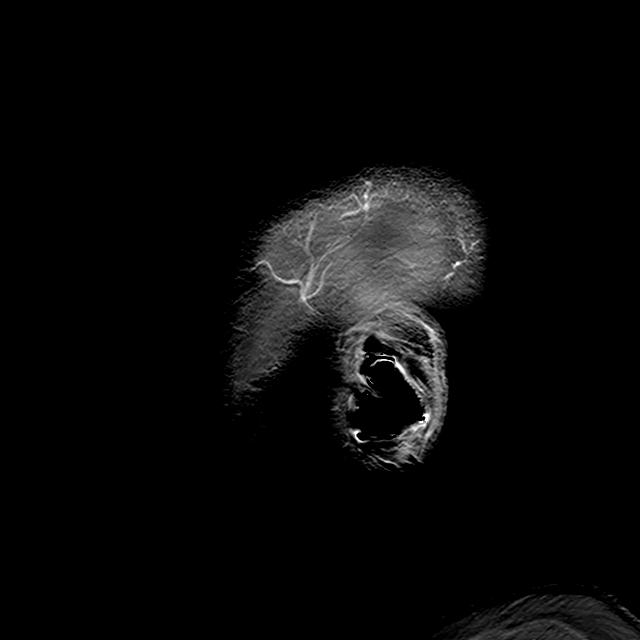

[20 of 48 positions shown; findings below may reference images not displayed]

FINDINGS: Intracranial contents: No evidence of acute or subacute infarct. There is an 
extra-axial, homogeneously enhancing T1 and T2 isointense lesion adjacent to the 
right frontal opercular region, minimally indenting portions of the right 
sylvian fissure region without brain parenchymal edema. This lesion measures
cm AP by 0.5 cm transverse by 1.2 cm craniocaudal. Adjacent marrow signal is 
unremarkable. There may be early tiny dural tails present. Typical appearance 
for extra-axial meningioma. No evidence of intracranial hemorrhage of any type. 
Normal sella. Cerebellar tonsils are appropriately located. No other evidence of 
mass. No other mass effect. No midline shift. Mild generalized brain volume loss 
without lobar specific pattern. Appropriate skull base vascular flow voids. No 
other pathologic contrast enhancement. Mild nonspecific periventricular and deep 
white matter T2 FLAIR hyperintensities likely reflective of chronic small vessel 
vascular change. 
Orbits, sinuses, and temporal bones: Normal orbits. Sinuses clear. No mastoid or 
middle ear effusion. 
Bones and soft tissues: Normal marrow signal. 6 mm nonspecific nodule within or 
adjacent to the left parotid gland image 4 series 701 is indeterminate but could 
reflect a nonenlarged lymph node.
IMPRESSION: Extra-axial mass lesion on the right adjacent to the right frontal operculum 
suggestive of typical meningioma. Minimal associated mass effect without brain 
parenchymal edema. 
No other evidence of mass lesion. Chronic appearing brain parenchymal changes as 
detailed above. 
Indeterminate subcentimeter nodule within or adjacent to left parotid gland 
could reflect nonenlarged lymph nodes.

## 2021-07-23 IMAGING — DX CHEST PA AND LATERAL
1 series · 2 of 2 positions shown · non-contrast
Comparison: none

________________________________________________________________________________________________ 
CLINICAL INDICATION:  Right-sided chest pain
TECHNIQUE: PA and lateral radiographs of the chest are compared to .

[Series 1: PA · 0.14mm/px · 2 of 2 slices shown]
[im 1/2]
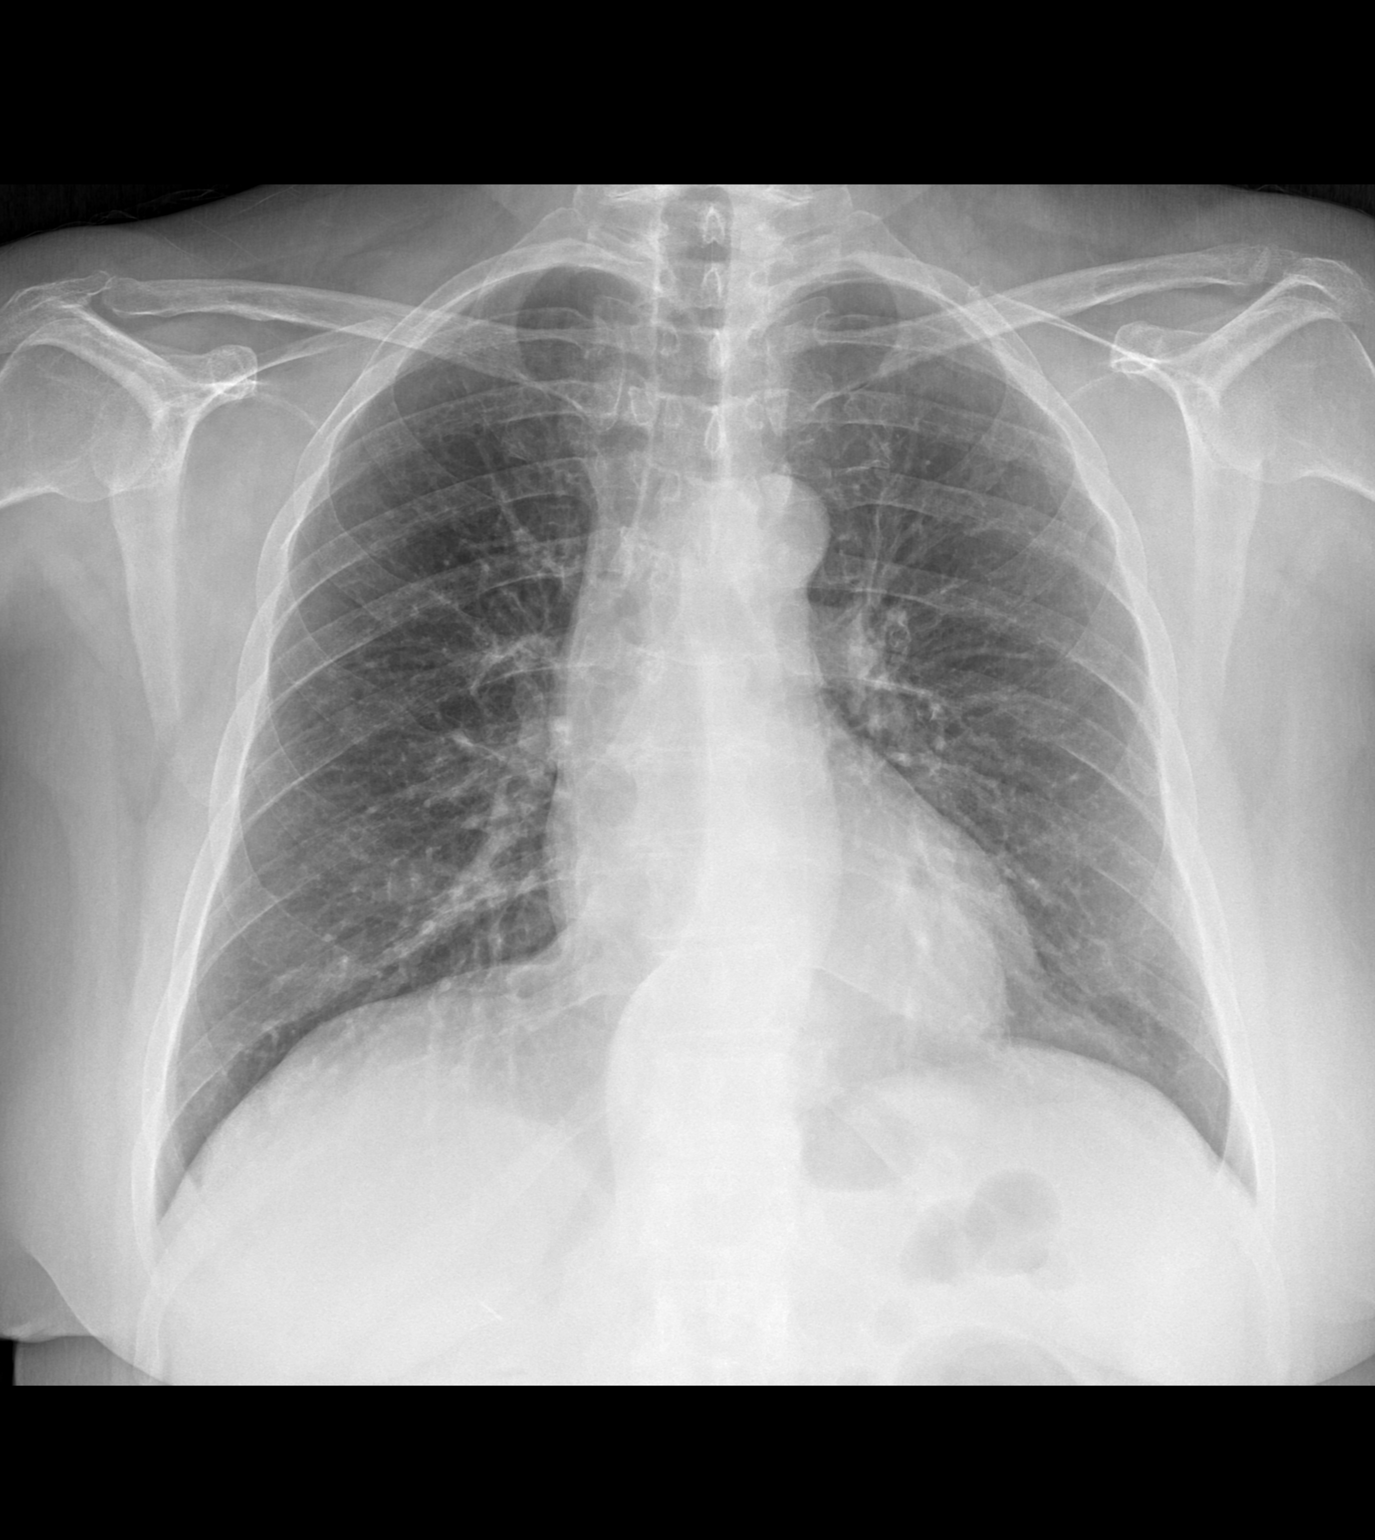
[im 2/2]
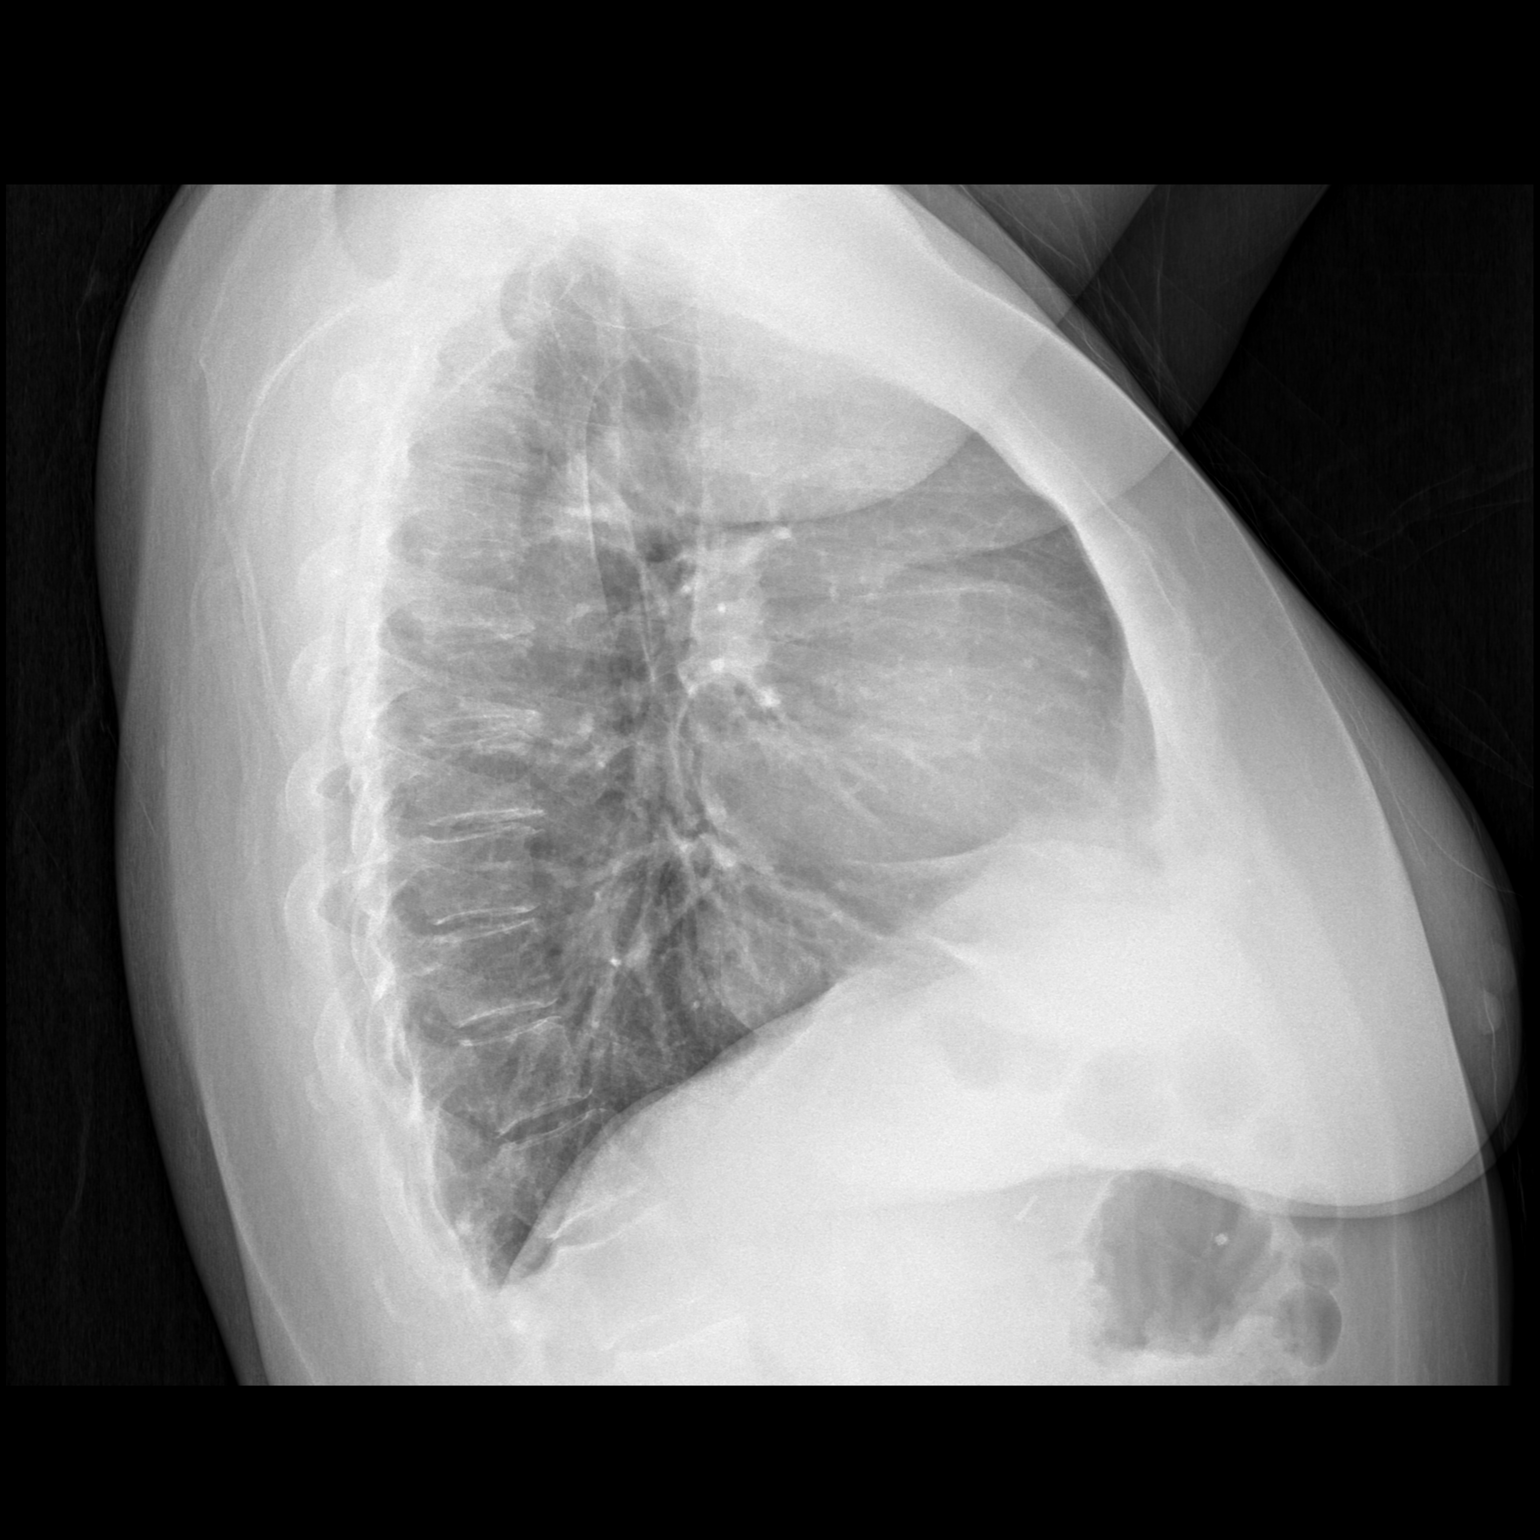

[2 of 2 positions shown; findings below may reference images not displayed]

FINDINGS: Lungs are well-expanded. No infiltrate or effusion. The heart is not 
enlarged. There is mild aortic ectasia. Moderate thoracic spine degenerative 
changes. No evidence for pneumothorax. No rib fracture. No osseous neoplastic 
focus identified.
IMPRESSION: No active cardiopulmonary findings. Moderate thoracic spine degenerative 
changes. If clinically indicated, chest CT would be useful.

## 2024-01-27 ENCOUNTER — Encounter

## 2024-01-27 ENCOUNTER — Inpatient Hospital Stay: Admit: 2024-01-27 | Payer: PRIVATE HEALTH INSURANCE | Primary: Family Medicine

## 2024-01-27 DIAGNOSIS — M25552 Pain in left hip: Secondary | ICD-10-CM

## 2024-02-10 ENCOUNTER — Inpatient Hospital Stay
Admit: 2024-02-10 | Discharge: 2024-02-10 | Disposition: A | Discharge status: Home / Self Care | Payer: Medicare (Managed Care) | Arrived: VH | Attending: Emergency Medicine

## 2024-02-10 ENCOUNTER — Emergency Department: Admit: 2024-02-10 | Payer: Medicare (Managed Care) | Primary: Family Medicine

## 2024-02-10 DIAGNOSIS — K529 Noninfective gastroenteritis and colitis, unspecified: Principal | ICD-10-CM

## 2024-02-10 LAB — COMPREHENSIVE METABOLIC PANEL
ALT: 16 U/L (ref 10–35)
AST: 22 U/L (ref 10–38)
Albumin/Globulin Ratio: 1.4 (ref 1.0–1.90)
Albumin: 4.6 g/dL (ref 3.4–5.0)
Alk Phosphatase: 120 U/L — ABNORMAL HIGH (ref 45–117)
Anion Gap: 13 mmol/L (ref 7–16)
BUN/Creatinine Ratio: 12
BUN: 12 mg/dL (ref 6–23)
CO2: 25 mmol/L (ref 21–32)
Calcium: 10.5 mg/dL — ABNORMAL HIGH (ref 8.5–10.1)
Chloride: 99 mmol/L (ref 98–107)
Creatinine: 0.99 mg/dL (ref 0.6–1.3)
Est, Glom Filt Rate: 64 mL/min/{1.73_m2} (ref >60)
Globulin: 3.4 g/dL (ref 2.30–3.50)
Glucose: 107 mg/dL (ref 74–108)
Potassium: 4.8 mmol/L (ref 3.5–5.5)
Sodium: 137 mmol/L (ref 136–145)
Total Bilirubin: 0.5 mg/dL (ref 0.2–1.0)
Total Protein: 8 g/dL (ref 6.4–8.2)

## 2024-02-10 LAB — URINALYSIS, MICRO: BACTERIA, URINE: NEGATIVE /HPF

## 2024-02-10 LAB — URINALYSIS
Bilirubin, Urine: NEGATIVE
Glucose, Ur: NEGATIVE mg/dL
Nitrite, Urine: NEGATIVE
Protein, UA: NEGATIVE mg/dL
Specific Gravity, UA: 1.023 (ref 1.005–1.030)
Urobilinogen, Urine: 1 EU/dL (ref 0.2–1.0)
pH, Urine: 5 (ref 5.0–8.0)

## 2024-02-10 LAB — CBC WITH AUTO DIFFERENTIAL
Basophils %: 0.3 % (ref 0.0–2.0)
Basophils Absolute: 0.04 10*3/uL (ref 0.00–0.10)
Eosinophils %: 0.2 % (ref 0.0–5.0)
Eosinophils Absolute: 0.03 10*3/uL (ref 0.00–0.40)
Hematocrit: 53 % — ABNORMAL HIGH (ref 35.0–45.0)
Hemoglobin: 17.5 g/dL — ABNORMAL HIGH (ref 12.0–16.0)
Immature Granulocytes %: 0.3 % (ref 0.0–0.5)
Immature Granulocytes Absolute: 0.04 10*3/uL (ref 0.00–0.04)
Lymphocytes %: 12.5 % — ABNORMAL LOW (ref 21.0–52.0)
Lymphocytes Absolute: 1.67 10*3/uL (ref 0.90–3.60)
MCH: 29.3 pg (ref 24.0–34.0)
MCHC: 33 g/dL (ref 31.0–37.0)
MCV: 88.8 FL (ref 78.0–100.0)
MPV: 10.2 FL (ref 9.2–11.8)
Monocytes %: 5.7 % (ref 3.0–10.0)
Monocytes Absolute: 0.76 10*3/uL (ref 0.05–1.20)
Neutrophils %: 81 % — ABNORMAL HIGH (ref 40.0–73.0)
Neutrophils Absolute: 10.84 10*3/uL — ABNORMAL HIGH (ref 1.80–8.00)
Nucleated RBCs: 0 /100{WBCs}
Platelets: 287 10*3/uL (ref 135–420)
RBC: 5.97 M/uL — ABNORMAL HIGH (ref 4.20–5.30)
RDW: 13.7 % (ref 11.6–14.5)
WBC: 13.4 10*3/uL — ABNORMAL HIGH (ref 4.6–13.2)
nRBC: 0 10*3/uL (ref 0.00–0.01)

## 2024-02-10 LAB — LIPASE: Lipase: 88 U/L — ABNORMAL HIGH (ref 13–75)

## 2024-02-10 MED ORDER — SODIUM CHLORIDE 0.9 % IV BOLUS
0.9 | Freq: Once | INTRAVENOUS | Status: AC
Start: 2024-02-10 — End: 2024-02-10
  Administered 2024-02-10: 15:00:00 1000 mL via INTRAVENOUS

## 2024-02-10 MED ORDER — HYDROCODONE-ACETAMINOPHEN 5-325 MG PO TABS
5-325 | ORAL_TABLET | ORAL | 0 refills | Status: DC | PRN
Start: 2024-02-10 — End: 2024-02-10

## 2024-02-10 MED ORDER — MORPHINE SULFATE (PF) 4 MG/ML IJ SOLN
4 | INTRAMUSCULAR | Status: AC
Start: 2024-02-10 — End: 2024-02-10
  Administered 2024-02-10: 16:00:00 4 mg via INTRAVENOUS

## 2024-02-10 MED ORDER — DICYCLOMINE HCL 20 MG PO TABS
20 | ORAL_TABLET | Freq: Four times a day (QID) | ORAL | 0 refills | Status: DC
Start: 2024-02-10 — End: 2024-02-10

## 2024-02-10 MED ORDER — ONDANSETRON HCL 4 MG PO TABS
4 | ORAL_TABLET | Freq: Three times a day (TID) | ORAL | 0 refills | Status: AC | PRN
Start: 2024-02-10 — End: ?

## 2024-02-10 MED ORDER — OXYCODONE-ACETAMINOPHEN 5-325 MG PO TABS
5-325 | ORAL_TABLET | Freq: Four times a day (QID) | ORAL | 0 refills | Status: AC | PRN
Start: 2024-02-10 — End: 2024-02-13

## 2024-02-10 MED ORDER — MORPHINE SULFATE (PF) 4 MG/ML IJ SOLN
4 | INTRAMUSCULAR | Status: AC
Start: 2024-02-10 — End: 2024-02-10
  Administered 2024-02-10: 15:00:00 4 mg via INTRAVENOUS

## 2024-02-10 MED ORDER — IOPAMIDOL 61 % IV SOLN
61 | Freq: Once | INTRAVENOUS | Status: AC | PRN
Start: 2024-02-10 — End: 2024-02-10
  Administered 2024-02-10: 14:00:00 100 mL via INTRAVENOUS

## 2024-02-10 MED FILL — ISOVUE-300 61 % IV SOLN: 61 % | INTRAVENOUS | Qty: 100

## 2024-02-10 MED FILL — MORPHINE SULFATE 4 MG/ML IJ SOLN: 4 mg/mL | INTRAMUSCULAR | Qty: 1 | Fill #0

## 2024-02-10 MED FILL — SODIUM CHLORIDE 0.9 % IV SOLN: 0.9 % | INTRAVENOUS | Qty: 1000

## 2024-02-10 NOTE — ED Notes (Signed)
 Pt OTF to CT

## 2024-02-10 NOTE — ED Notes (Signed)
 Discharge instructions reviewed with patient.  Patient verbalized understanding.  Patient advised to follow up as directed on discharge instructions.  Patient denies questions, needs or concerns at this time.  Patient verbalized understanding. No s/sx of distress noted.

## 2024-02-10 NOTE — Discharge Instructions (Signed)
 Take the prescribed Bentyl as needed for abdominal cramping and Zofran for nausea.  Make sure you are staying hydrated.  Follow-up with your doctors and your surgeon as scheduled.  Return to the ER for any new or worsening symptoms.

## 2024-02-10 NOTE — ED Notes (Cosign Needed)
 Assisted pt to the bathroom via wheelchair, and recollected vitals.

## 2024-02-10 NOTE — ED Triage Notes (Signed)
 Patient presents to ER with c/o severe above umbilicus pain that started about 4hr ago. Patient states that she is scheduled for hernia repair next week in [place] .

## 2024-02-10 NOTE — ED Provider Notes (Addendum)
 EMERGENCY DEPARTMENT HISTORY AND PHYSICAL EXAM      Date: 02/10/2024  Patient Name: Cassandra Daniels    History of Presenting Illness     Chief Complaint   Patient presents with    Abdominal Pain       63 year old female with history of gastric bypass and hiatal hernia presenting to the emergency department for evaluation of umbilical abdominal pain.  Patient reports sharp cramping pain that started approximately 4 hours ago with associated diarrhea.  Reports nausea but no vomiting.  Patient states that she is scheduled to have had a hernia repair in [place]  next week.              PCP: Yvetta Deland BROCKS, MD    Current Facility-Administered Medications   Medication Dose Route Frequency Provider Last Rate Last Admin    sodium chloride 0.9 % bolus 1,000 mL  1,000 mL IntraVENous Once Blaze Sandin K, DO 983.6 mL/hr at 02/10/24 1040 1,000 mL at 02/10/24 1040     Current Outpatient Medications   Medication Sig Dispense Refill    dicyclomine (BENTYL) 20 MG tablet Take 1 tablet by mouth 4 times daily for 10 days 40 tablet 0    ondansetron (ZOFRAN) 4 MG tablet Take 1 tablet by mouth 3 times daily as needed for Nausea or Vomiting 15 tablet 0    cyclobenzaprine (FLEXERIL) 10 MG tablet Take by mouth 3 times daily as needed      diclofenac sodium (VOLTAREN) 1 % GEL Apply topically as needed      ergocalciferol (ERGOCALCIFEROL) 1.25 MG (50000 UT) capsule Take 50,000 Units by mouth daily      gabapentin (NEURONTIN) 300 MG capsule Take 300 mg by mouth 2 times daily.      gabapentin (NEURONTIN) 600 MG tablet Take by mouth.      hydrOXYzine HCl (ATARAX) 10 MG tablet Take by mouth 3 times daily as needed      Hyoscyamine Sulfate SL 0.125 MG SUBL Take 0.125 mg by mouth every 6 hours as needed      metFORMIN (GLUCOPHAGE) 500 MG tablet Take 500 mg by mouth every evening      oxyCODONE-acetaminophen (PERCOCET) 5-325 MG per tablet Take by mouth every 6 hours as needed.      primidone (MYSOLINE) 50 MG tablet Take 100 mg by mouth daily       traMADol (ULTRAM ER) 100 MG TB24 extended release tablet Take 200 mg by mouth.      venlafaxine (EFFEXOR XR) 150 MG extended release capsule Take 150 mg by mouth daily         Past History     Past Medical History:  Past Medical History:   Diagnosis Date    Adverse effect of anesthesia     slow to come out of anesthesia.    Anxiety     Arthritis     Atherosclerotic cardiovascular disease     Bilateral knee pain     Broken nose 1999    surgery    Carpal tunnel syndrome     Chronic fatigue     Fibrocystic breast     bilateral     Fibromyalgia     Gastric bypass status for obesity 2009    GERD (gastroesophageal reflux disease)     Left groin pain     Left hip pain     Meningioma nos     MVP (mitral valve prolapse)     Pain management  Pancreatitis 2014    Paroxysmal atrial tachycardia     Paroxysmal tachycardia (HCC)     PVC (premature ventricular contraction)     Right hand pain     Right hip pain     Right shoulder pain     Stroke (HCC) 2014/2019    no residual effects    Unspecified adverse effect of anesthesia     difficulty waking up     Unspecified sleep apnea     does not use cpap    Vitamin B 12 deficiency        Past Surgical History:  Past Surgical History:   Procedure Laterality Date    ABLATION OF SVT  2007    by Dr. Myron    BLADDER REPAIR  1996    CHOLECYSTECTOMY  2014    GASTRIC BYPASS SURGERY  11/07    HYSTERECTOMY (CERVIX STATUS UNKNOWN)      KNEE ARTHROSCOPY      x5    MAM BIOPSY BREAST STEREOTACTIC  april, 2013    right, benign    RHINOPLASTY      SHOULDER ARTHROSCOPY         Family History:  Family History   Problem Relation Age of Onset    Other Father         cabg    Asthma Mother     Breast Cancer Maternal Grandmother 50    Heart Attack Maternal Grandmother 46    High Cholesterol Father     Breast Cancer Mother 8    Cancer Mother     Heart Disease Father        Social History:  Social History     Tobacco Use    Smoking status: Never    Smokeless tobacco: Never   Substance Use Topics     Alcohol use: Not Currently     Alcohol/week: 0.0 standard drinks of alcohol    Drug use: No       Allergies:  Allergies   Allergen Reactions    Beta Adrenergic Blockers Other (See Comments)     Fatigue    Nsaids      Other reaction(s): Unknown (comments)  Caution h/o gastric bypass 2006    Tetracycline Hives         Review of Systems       Review of Systems   Constitutional:  Negative for activity change, fatigue and fever.   Respiratory:  Negative for chest tightness and shortness of breath.    Cardiovascular:  Negative for chest pain.   Gastrointestinal:  Positive for abdominal pain, diarrhea and nausea. Negative for vomiting.   Musculoskeletal:  Negative for arthralgias and myalgias.   Skin:  Negative for rash and wound.   Neurological:  Negative for dizziness, weakness, light-headedness, numbness and headaches.   Psychiatric/Behavioral:  Negative for agitation.          Physical Exam   BP 128/89   Pulse 96   Temp 98.3 F (36.8 C) (Oral)   Resp 20   Ht 1.575 m (5' 2)   Wt 80.7 kg (178 lb)   SpO2 94%   BMI 32.56 kg/m       Physical Exam  Constitutional:       General: She is not in acute distress.     Appearance: She is not ill-appearing.   HENT:      Head: Normocephalic and atraumatic.      Mouth/Throat:      Mouth: Mucous  membranes are moist.   Eyes:      Extraocular Movements: Extraocular movements intact.      Pupils: Pupils are equal, round, and reactive to light.   Cardiovascular:      Rate and Rhythm: Normal rate and regular rhythm.   Pulmonary:      Effort: Pulmonary effort is normal.      Breath sounds: Normal breath sounds.   Abdominal:      General: Abdomen is flat.      Palpations: Abdomen is soft.      Tenderness: There is abdominal tenderness in the epigastric area. There is no guarding or rebound.      Hernia: No hernia is present.   Musculoskeletal:         General: No swelling or deformity. Normal range of motion.      Cervical back: Normal range of motion and neck supple.   Skin:      General: Skin is warm and dry.      Capillary Refill: Capillary refill takes less than 2 seconds.   Neurological:      General: No focal deficit present.      Mental Status: She is alert and oriented to person, place, and time.      Cranial Nerves: No cranial nerve deficit.      Sensory: No sensory deficit.      Motor: No weakness.   Psychiatric:         Mood and Affect: Mood normal.           Diagnostic Study Results     Labs -  Recent Results (from the past 12 hours)   CBC with Auto Differential    Collection Time: 02/10/24  9:32 AM   Result Value Ref Range    WBC 13.4 (H) 4.6 - 13.2 K/uL    RBC 5.97 (H) 4.20 - 5.30 M/uL    Hemoglobin 17.5 (H) 12.0 - 16.0 g/dL    Hematocrit 46.9 (H) 35.0 - 45.0 %    MCV 88.8 78.0 - 100.0 FL    MCH 29.3 24.0 - 34.0 PG    MCHC 33.0 31.0 - 37.0 g/dL    RDW 86.2 88.3 - 85.4 %    Platelets 287 135 - 420 K/uL    MPV 10.2 9.2 - 11.8 FL    Nucleated RBCs 0.0 0 PER 100 WBC    nRBC 0.00 0.00 - 0.01 K/uL    Neutrophils % 81.0 (H) 40.0 - 73.0 %    Lymphocytes % 12.5 (L) 21.0 - 52.0 %    Monocytes % 5.7 3.0 - 10.0 %    Eosinophils % 0.2 0.0 - 5.0 %    Basophils % 0.3 0.0 - 2.0 %    Immature Granulocytes % 0.3 0.0 - 0.5 %    Neutrophils Absolute 10.84 (H) 1.80 - 8.00 K/UL    Lymphocytes Absolute 1.67 0.90 - 3.60 K/UL    Monocytes Absolute 0.76 0.05 - 1.20 K/UL    Eosinophils Absolute 0.03 0.00 - 0.40 K/UL    Basophils Absolute 0.04 0.00 - 0.10 K/UL    Immature Granulocytes Absolute 0.04 0.00 - 0.04 K/UL    Differential Type AUTOMATED     Comprehensive Metabolic Panel    Collection Time: 02/10/24  9:32 AM   Result Value Ref Range    Sodium 137 136 - 145 mmol/L    Potassium 4.8 3.5 - 5.5 mmol/L    Chloride 99 98 - 107 mmol/L  CO2 25 21 - 32 mmol/L    Anion Gap 13 7 - 16 mmol/L    Glucose 107 74 - 108 mg/dL    BUN 12 6 - 23 MG/DL    Creatinine 9.00 0.6 - 1.3 MG/DL    BUN/Creatinine Ratio 12      Est, Glom Filt Rate 64 >60 ml/min/1.49m2    Calcium 10.5 (H) 8.5 - 10.1 MG/DL    Total Bilirubin 0.5  0.2 - 1.0 MG/DL    ALT 16 10 - 35 U/L    AST 22 10 - 38 U/L    Alk Phosphatase 120 (H) 45 - 117 U/L    Total Protein 8.0 6.4 - 8.2 g/dL    Albumin 4.6 3.4 - 5.0 g/dL    Globulin 3.4 7.69 - 3.50 g/dL    Albumin/Globulin Ratio 1.4 1.0 - 1.90     Lipase    Collection Time: 02/10/24  9:32 AM   Result Value Ref Range    Lipase 88 (H) 13 - 75 U/L   Urinalysis    Collection Time: 02/10/24  9:40 AM   Result Value Ref Range    Color, UA YELLOW      Appearance CLOUDY      Specific Gravity, UA 1.023 1.005 - 1.030      pH, Urine 5.0 5.0 - 8.0      Protein, UA Negative NEG mg/dL    Glucose, Ur Negative NEG mg/dL    Ketones, Urine TRACE (A) NEG mg/dL    Bilirubin, Urine Negative NEG      Blood, Urine MODERATE (A) NEG      Urobilinogen, Urine 1.0 0.2 - 1.0 EU/dL    Nitrite, Urine Negative NEG      Leukocyte Esterase, Urine TRACE (A) NEG     Urinalysis, Micro    Collection Time: 02/10/24  9:40 AM   Result Value Ref Range    WBC, UA 0-3 0 - 4 /hpf    RBC, UA 4-10 0 - 5 /hpf    Epithelial Cells, UA 1+ 0 - 5 /lpf    BACTERIA, URINE Negative NEG /hpf    Mucus, UA 1+ (A) NEG /lpf       Radiologic Studies -   Non-plain film images such as CT, Ultrasound and MRI are read by the radiologist. Plain radiographic images are visualized and preliminarily interpreted by the emergency physician.    CT ABDOMEN PELVIS W IV CONTRAST Additional Contrast? None   Final Result      Lower abdomen with a few segments of small bowel wall thickening, likely   nonspecific enteritis.      Trace pelvic free fluid, likely reactive.      Status post gastric bypass with moderate sized hiatal hernia comprised of   gastric pouch and anastomosis, slightly larger compared to 2014.      See additional details above.      Electronically signed by Keller Soda              Medical Decision Making   I am the first provider for this patient.    I reviewed the vital signs, available nursing notes, past medical history, past surgical history, family history and social  history.      Vital Signs-Reviewed the patient's vital signs.    EKG: All EKG's are interpreted by the Emergency Department Physician who either signs or Co-signs this chart in the absence of a cardiologist.  Interpretation per the Radiologist below, if available at the time of this note:    ED Course: Progress Notes, Reevaluation, and Consults:    Provider Notes (Medical Decision Making): sch      MDM  Number of Diagnoses or Management Options  Enteritis  Diagnosis management comments: Patient is well-appearing and in no acute distress.  Vital signs are stable.  Abdominal exam demonstrating epigastric/periumbilical tenderness but without guarding or rebound.  Screening labs obtained demonstrating marginally elevated white blood cell count but otherwise is unremarkable.  CT of the abdomen and pelvis obtained showing nonspecific enteritis but no evidence of obstruction or perforation.  Patient feeling improved with morphine and IV fluids and Zofran.  Will discharge home with norco and Zofran.  Instructed on follow-up and ED return precautions.  Patient verbalizes understanding and agreement with plan.  Stable for discharge.                Procedures          Diagnosis     Clinical Impression:   1. Enteritis        Disposition: discharged    Disclaimer: Sections of this note are dictated using utilizing voice recognition software.  Minor typographical errors may be present. If questions arise, please do not hesitate to contact me or call our department.          Sherril Prentice POUR, DO  02/10/24 1112       Sherril Prentice POUR, DO  02/10/24 1137
